# Patient Record
Sex: Female | Born: 1945 | Race: White | Hispanic: No | Smoking: Never smoker
Health system: Southern US, Community
[De-identification: ages and names within clinical notes are randomized; demographics above are authoritative.]

## PROBLEM LIST (undated history)

## (undated) DIAGNOSIS — G2581 Restless legs syndrome: Secondary | ICD-10-CM

## (undated) DIAGNOSIS — G4733 Obstructive sleep apnea (adult) (pediatric): Secondary | ICD-10-CM

## (undated) DIAGNOSIS — R51 Headache: Secondary | ICD-10-CM

## (undated) DIAGNOSIS — K219 Gastro-esophageal reflux disease without esophagitis: Secondary | ICD-10-CM

## (undated) DIAGNOSIS — I2510 Atherosclerotic heart disease of native coronary artery without angina pectoris: Secondary | ICD-10-CM

## (undated) DIAGNOSIS — F32A Depression, unspecified: Secondary | ICD-10-CM

## (undated) DIAGNOSIS — I480 Paroxysmal atrial fibrillation: Secondary | ICD-10-CM

## (undated) DIAGNOSIS — R519 Headache, unspecified: Secondary | ICD-10-CM

## (undated) DIAGNOSIS — I5189 Other ill-defined heart diseases: Secondary | ICD-10-CM

## (undated) DIAGNOSIS — F329 Major depressive disorder, single episode, unspecified: Secondary | ICD-10-CM

## (undated) DIAGNOSIS — E785 Hyperlipidemia, unspecified: Secondary | ICD-10-CM

## (undated) DIAGNOSIS — I10 Essential (primary) hypertension: Secondary | ICD-10-CM

## (undated) DIAGNOSIS — M1990 Unspecified osteoarthritis, unspecified site: Secondary | ICD-10-CM

## (undated) HISTORY — DX: Hyperlipidemia, unspecified: E78.5

## (undated) HISTORY — DX: Obstructive sleep apnea (adult) (pediatric): G47.33

## (undated) HISTORY — PX: CHOLECYSTECTOMY: SHX55

## (undated) HISTORY — DX: Other ill-defined heart diseases: I51.89

## (undated) HISTORY — PX: LUMBAR DISC SURGERY: SHX700

## (undated) HISTORY — DX: Paroxysmal atrial fibrillation: I48.0

## (undated) HISTORY — DX: Restless legs syndrome: G25.81

## (undated) HISTORY — PX: ROTATOR CUFF REPAIR: SHX139

## (undated) HISTORY — PX: CARDIAC CATHETERIZATION: SHX172

## (undated) HISTORY — PX: TENDON REPAIR: SHX5111

---

## 1983-03-29 HISTORY — PX: OTHER SURGICAL HISTORY: SHX169

## 1999-02-25 ENCOUNTER — Encounter: Admission: RE | Admit: 1999-02-25 | Discharge: 1999-02-25 | Payer: Self-pay | Admitting: Family Medicine

## 1999-02-25 ENCOUNTER — Encounter: Payer: Self-pay | Admitting: Family Medicine

## 1999-03-16 ENCOUNTER — Encounter: Payer: Self-pay | Admitting: General Surgery

## 1999-03-17 ENCOUNTER — Encounter (INDEPENDENT_AMBULATORY_CARE_PROVIDER_SITE_OTHER): Payer: Self-pay | Admitting: *Deleted

## 1999-03-17 ENCOUNTER — Observation Stay (HOSPITAL_COMMUNITY): Admission: RE | Admit: 1999-03-17 | Discharge: 1999-03-18 | Payer: Self-pay | Admitting: General Surgery

## 1999-06-03 ENCOUNTER — Encounter: Admission: RE | Admit: 1999-06-03 | Discharge: 1999-06-03 | Payer: Self-pay | Admitting: Family Medicine

## 1999-06-03 ENCOUNTER — Encounter: Payer: Self-pay | Admitting: Family Medicine

## 2000-06-08 ENCOUNTER — Encounter: Payer: Self-pay | Admitting: Family Medicine

## 2000-06-08 ENCOUNTER — Encounter: Admission: RE | Admit: 2000-06-08 | Discharge: 2000-06-08 | Payer: Self-pay | Admitting: Family Medicine

## 2001-06-15 ENCOUNTER — Encounter: Payer: Self-pay | Admitting: Family Medicine

## 2001-06-15 ENCOUNTER — Encounter: Admission: RE | Admit: 2001-06-15 | Discharge: 2001-06-15 | Payer: Self-pay | Admitting: Family Medicine

## 2001-11-28 ENCOUNTER — Ambulatory Visit (HOSPITAL_COMMUNITY): Admission: RE | Admit: 2001-11-28 | Discharge: 2001-11-29 | Payer: Self-pay | Admitting: Neurosurgery

## 2002-06-03 ENCOUNTER — Encounter: Payer: Self-pay | Admitting: Family Medicine

## 2002-06-03 ENCOUNTER — Ambulatory Visit (HOSPITAL_COMMUNITY): Admission: RE | Admit: 2002-06-03 | Discharge: 2002-06-03 | Payer: Self-pay | Admitting: Family Medicine

## 2002-06-17 ENCOUNTER — Encounter: Payer: Self-pay | Admitting: Family Medicine

## 2002-06-17 ENCOUNTER — Encounter: Admission: RE | Admit: 2002-06-17 | Discharge: 2002-06-17 | Payer: Self-pay | Admitting: Family Medicine

## 2003-04-23 ENCOUNTER — Encounter
Admission: RE | Admit: 2003-04-23 | Discharge: 2003-05-19 | Payer: Self-pay | Admitting: Physical Medicine and Rehabilitation

## 2003-06-06 ENCOUNTER — Ambulatory Visit (HOSPITAL_COMMUNITY)
Admission: RE | Admit: 2003-06-06 | Discharge: 2003-06-06 | Payer: Self-pay | Admitting: Physical Medicine and Rehabilitation

## 2003-07-14 ENCOUNTER — Encounter: Admission: RE | Admit: 2003-07-14 | Discharge: 2003-09-17 | Payer: Self-pay | Admitting: Orthopedic Surgery

## 2003-09-16 ENCOUNTER — Encounter: Admission: RE | Admit: 2003-09-16 | Discharge: 2003-09-17 | Payer: Self-pay | Admitting: Orthopedic Surgery

## 2004-03-28 HISTORY — PX: CORONARY STENT PLACEMENT: SHX1402

## 2004-04-16 ENCOUNTER — Encounter: Admission: RE | Admit: 2004-04-16 | Discharge: 2004-04-16 | Payer: Self-pay | Admitting: Family Medicine

## 2005-01-28 ENCOUNTER — Inpatient Hospital Stay (HOSPITAL_COMMUNITY): Admission: EM | Admit: 2005-01-28 | Discharge: 2005-02-01 | Payer: Self-pay | Admitting: Emergency Medicine

## 2005-02-22 ENCOUNTER — Ambulatory Visit (HOSPITAL_COMMUNITY): Admission: RE | Admit: 2005-02-22 | Discharge: 2005-02-23 | Payer: Self-pay | Admitting: Interventional Cardiology

## 2005-12-14 ENCOUNTER — Observation Stay (HOSPITAL_COMMUNITY): Admission: AD | Admit: 2005-12-14 | Discharge: 2005-12-15 | Payer: Self-pay | Admitting: Cardiology

## 2007-02-20 ENCOUNTER — Inpatient Hospital Stay (HOSPITAL_COMMUNITY): Admission: EM | Admit: 2007-02-20 | Discharge: 2007-02-21 | Payer: Self-pay | Admitting: Emergency Medicine

## 2007-04-11 ENCOUNTER — Encounter: Admission: RE | Admit: 2007-04-11 | Discharge: 2007-04-11 | Payer: Self-pay | Admitting: Family Medicine

## 2008-04-11 ENCOUNTER — Encounter: Admission: RE | Admit: 2008-04-11 | Discharge: 2008-04-11 | Payer: Self-pay | Admitting: Family Medicine

## 2009-04-14 ENCOUNTER — Encounter: Admission: RE | Admit: 2009-04-14 | Discharge: 2009-04-14 | Payer: Self-pay | Admitting: Family Medicine

## 2010-04-14 ENCOUNTER — Encounter
Admission: RE | Admit: 2010-04-14 | Discharge: 2010-04-14 | Payer: Self-pay | Source: Home / Self Care | Attending: Family Medicine | Admitting: Family Medicine

## 2010-08-10 NOTE — H&P (Signed)
[name], [name] NO.:  [id number]   MEDICAL RECORD NO.:  [id number]          PATIENT TYPE:  INP   LOCATION:  2035                         FACILITY:  [facility]   PHYSICIAN:  Armanda Magic, M.D.     DATE OF BIRTH:  Mar 08, 1946   DATE OF ADMISSION:  02/20/2007  DATE OF DISCHARGE:                              HISTORY & PHYSICAL   CHIEF COMPLAINT:  Chest pain.   HISTORY OF PRESENT ILLNESS:  [name] is a 65 year old female with a  known history of coronary artery disease.  She received drug-eluting  stent to both the right coronary artery and LAD in November of 2006.  Re-  look catheterization for chest pain September of 2007 showed patent  stents but also showed small distal circumflex and RV branch disease  that were too small, and not amenable, for percutaneous intervention.  She now is having substernal chest pain/pressure and says that, It is  hard to breathe.  The pain started around 10:30 a.m. and has been  waxing and waning since that time.  She was put on IV nitroglycerin in  the emergency room.  Her pain subsided.   ALLERGIES:  NO KNOWN DRUG ALLERGIES.   MEDICATIONS:  1. Toprol XL 25 mg a day.  2. Crestor 10 mg a day.  3. Plavix 75 mg a day.  4. Imdur 30 mg a day.  5. Mirapex 0.5 mg twice a day.  6. Zetia 10 mg a day.  7. Nexium 40 mg a day.  8. Baby aspirin daily.   SOCIAL HISTORY:  No tobacco or alcohol or illicit drug use.   FAMILY HISTORY:  [name] died at age 12 but had his first myocardial  infarction at age 57.  [name] died at age 39 but had her first MI in her  66s.   PAST MEDICAL HISTORY:  1. Coronary artery disease as described above.  2. History of hypertension, hyperlipidemia.  3. Status post cholecystectomy.  4. Back surgery.  5. Left rotator cuff surgery.   VITALS:  Temperature 97.6, blood pressure 106/70, pulse 78, respirations  16.  HEENT:  Grossly normal.  No carotid or symptomatic bruits.  No JVD or  thyromegaly.  Throat clear.   Conjunctivae are normal.  Nares without  drainage.  CHEST:  Clear to auscultation bilaterally.  No wheezing or rhonchi.  HEART:  Regular rate and rhythm.  No gross murmur.  ABDOMEN:  Benign.  Good bowel sounds, nontender, nondistended.  No  masses, no bruits.  LOWER EXTREMITIES:  No peripheral edema.  SKIN:  Warm and dry.  PSYCH:  Normal mood and affect.   EKG shows normal sinus rhythm, rate 54, diffuse point elevation which is  essentially unchanged from February of 2006.  Chest x-ray with no active  disease.   LABORATORY DATA:  BNP less than 30.  __________  markers negative times  one.  D-dimer less than 0.22.  Sodium 147, potassium 4.3, BUN 23,  creatinine 0.7.   ASSESSMENT/PLAN:  1. Unstable angina.  2. Coronary artery disease.  3. Hypertension.  4. Hyperlipidemia.  5. Long-term medication use.  The patient will be placed on Lovenox, IV nitroglycerin drip and an  aspirin.  We will continue the same low dose beta  blocker but cannot  increase the secondary due to his bradycardia.  We will perform serial  cardiac isoenzymes.  If these are negative, she will have a Cardiolite  in the morning and, if positive, we will then need to proceed with  cardiac catheterization.   The patient was seen and examined by Dr. Armanda Magic; Guy Franco, [facility],  dictating for Dr. Armanda Magic.     ______________________________  Su Monks, M.D.  Electronically Signed    LB/MEDQ  D:  02/20/2007  T:  02/21/2007  Job:  [id number]   cc:   Bryan Lemma. Manus Gunning, M.D.  Tasia Catchings, M.D.

## 2010-08-13 NOTE — Cardiovascular Report (Signed)
NAME:  [name], [name] NO.:  [id number]   MEDICAL RECORD NO.:  [id number]          PATIENT TYPE:  INP   LOCATION:  3731                         FACILITY:  [facility]   PHYSICIAN:  Armanda Magic, M.D.     DATE OF BIRTH:  09-05-1945   DATE OF PROCEDURE:  12/15/2005  DATE OF DISCHARGE:  12/15/2005                              CARDIAC CATHETERIZATION   PROCEDURE:  Left heart catheterization, coronary angiography, left  ventriculography.   OPERATOR:  Armanda Magic, M.D.   INDICATIONS:  Chest pain, coronary disease.   COMPLICATIONS:  None.  At the time of cath, the patient did have acute  disorientation and agitation after receiving 2 mg of Versed IV.   IV MEDICATIONS:  1. Versed 2 mg IV.  2. Romazicon 0.4 mg IV.   IV access via right femoral artery 6-French sheath   HISTORY OF PRESENT ILLNESS:  This is a very pleasant 65 year old white  female with a history of coronary disease who presented with recurrence of  chest pain, over 2 weeks duration, fairly frequently, occurring every day.  She now presents for cardiac catheterization.   The patient was brought to cardiac catheterization laboratory during the  fasting non-sedated state.  Informed consent was obtained.  The patient was  connected to continuous heart rate and pulse oximetry monitoring and  intermittent blood pressure monitoring.  The patient had some problems with  restless leg syndrome, and therefore, was given 2 mg of IV Versed which  caused a paradoxical reaction.  She became very agitated and very  disoriented and had received Romazicon 0.4 mg IV with resolution of her  symptoms of disorientation agitation.  The right groin was prepped and  draped in a sterile fashion and 1% Xylocaine was used for local anesthesia.  Using modified Seldinger technique a 6-French sheath was placed a right  femoral artery.  Under fluoroscopic guidance, a 6-French JL-4 catheter was  placed in left coronary artery.   Multiple cine films were taken at 30 degree  RAO and 40 degree LAO views.  This catheter was then exchanged out of her  guidewire for 6-French JR-4 catheter, which was placed under fluoroscopic  guidance of the right coronary artery.  Multiple cine films taken in 30  degree RAO and 40 degree LAO views.  Catheter was exchanged out of her  guidewire for 6-French angled pigtail catheter, which was placed under  fluoroscopic guidance of the left ventricular cavity.  Left ventriculography  was performed in the 30 degree RAO view, in a total of 30 mL of contrast at  15 mL per second.  The catheter was then pulled back across the aortic valve  with no significant gradient noted.  At the end procedure, all catheters and  sheaths were removed.  Manual compression was performed until adequate  hemostasis was obtained.  The patient transferred back to room in stable  condition.   RESULTS:  1. Left main coronary artery is widely patent and bifurcates to left      anterior descending artery and left circumflex artery.  2. Left circumflex artery has a stent present  the proximal portion which      is widely patent.  It gives rise to a first diagonal branch which is      widely patent.  Just after the takeoff of the first diagonal branch is      30% eccentric plaque in the mid LAD.  The ongoing LAD traverses the      apex and is widely patent.  3. The left circumflex is widely patent in its proximal portion.  It gives      rise to a first large obtuse marginal one branch which has a 20%      narrowing in the proximal portion.  This vessel then bifurcates into      two daughter branches, both of which are widely patent.  The ongoing      left circumflex traverses the AV groove and becomes very small with a      70% narrowing distally, too small to be a amendable to PCI.  4. The right coronary artery shows a visible stent in the proximal portion      which is widely patent.  Shortly after, it gives off a  very large acute      RV marginal branch which has an 90% narrowing in the mid portion and      then bifurcates into two daughter branches.  The vessel is very small      and too small for a PCI.  The ongoing right coronary artery is widely      patent and bifurcates distally in a posterior descending artery and      posterolateral artery, both of which are widely patent.  5. Left ventriculography shows normal LV function.  EF 50%, LV pressure      163/3 mm,  aortic pressure 160/74 mmHg.   ASSESSMENT:  1. Widely patent LAD and RCA stents, branch vessel occlusive coronary      disease of acute RV marginal branch and distal left circumflex, too      small for PCI.  2. Normal LV function.  3. Atypical chest pain, most likely not cardiac, but could be secondary to      see me in small vessels with his that have a stress obstructive      disease.  4. Hypertension.  5. Dyslipidemia.   PLAN:  Discharge home after IV fluid and bedrest.  Aspirin and Plavix.  Start Imdur 30 mg a day.  Start Nexium 40 mg a day.  Follow up with me in 2  weeks at this dictation      Armanda Magic, M.D.  Electronically Signed     TT/MEDQ  D:  12/16/2005  T:  12/19/2005  Job:  [id number]

## 2010-08-13 NOTE — Cardiovascular Report (Signed)
NAME:  [name], [name] NO.:  [id number]   MEDICAL RECORD NO.:  [id number]          PATIENT TYPE:  OIB   LOCATION:  6532                         FACILITY:  [facility]   PHYSICIAN:  Lyn Records, M.D.   DATE OF BIRTH:  [birth year]   DATE OF PROCEDURE:  02/22/2005  DATE OF DISCHARGE:                              CARDIAC CATHETERIZATION   REFERRING PHYSICIAN:  Dr. Blair Heys and Dr. Armanda Magic   INDICATIONS FOR PROCEDURE:  Class II-III [place] Heart Association angina.  Documented plaque disruption with dissection in the proximal LAD.  This was  documented by catheterization on January 31, 2005.  The patient had a stent  placed in high-grade right coronary lesion on January 31, 2005 using a drug-  eluting stent.  She is admitted at this time for LAD angioplasty/stent.   PROCEDURE:  Direct stent proximal left anterior descending using CYPHER drug-  eluting stent.   DESCRIPTION:  After informed consent a 6-French sheath was placed in the  right femoral artery using the modified Seldinger technique.  A 6-French  XB3.5 left coronary system guide catheter was used for guiding shots.  A  bolus followed by an infusion of Angiomax was given.  ACT was documented to  be greater than 250.  We then proceeded using an [employer] medium wire and  crossed the stenosis in the proximal LAD without difficulty.  We then direct  stented with a 13 x 3 CYPHER drug-eluting stent and post dilated with a 12  mm x 3.5 Quantum balloon to 14 atmospheres.  A nice angiographic result was  noted.  No significant ischemic complications occurred during the procedure.  The bifurcation was spared with no stent placed over the ostium of the  diagonal and the diagonal was widely patent post procedure.  TIMI grade 3  flow was noted post procedure.   Sheathogram was performed and adequate position was noted for AngioSeal  closure.  This was performed without trouble and good hemostasis achieved.   CONCLUSION:  Successful direct stent of the proximal LAD with reduction in  stenosis from 80% to 0% with TIMI grade 3 flow.   PLAN:  Aspirin and Plavix for one year.  The patient has drug-eluting stents  in both the right and LAD coronaries.   PLAN:  Follow up with Dr. Mayford Knife and Dr. Manus Gunning.  Hopeful discharge  February 23, 2005.      Lyn Records, M.D.  Electronically Signed     HWS/MEDQ  D:  02/22/2005  T:  02/22/2005  Job:  [id number]

## 2010-08-13 NOTE — Discharge Summary (Signed)
[name], [name] NO.:  [id number]   MEDICAL RECORD NO.:  [id number]          PATIENT TYPE:  OIB   LOCATION:  6532                         FACILITY:  [facility]   PHYSICIAN:  Lyn Records, M.D.   DATE OF BIRTH:  1945-04-12   DATE OF ADMISSION:  02/22/2005  DATE OF DISCHARGE:  02/23/2005                                 DISCHARGE SUMMARY   PRIMARY CARDIOLOGIST:  Armanda Magic, M.D.   PRIMARY CARE PHYSICIAN:  Bryan Lemma. Manus Gunning, M.D.   DISCHARGE PHYSICIAN:  Verdis Prime, M.D.   CHIEF COMPLAINT AND REASON FOR ADMISSION:  [name] is a 65 year old  female patient initially referred to Cardiology for complaints of atypical  chest discomfort, subsequent abnormal Cardiolite study resulting in  diagnostic coronary angiography on January 31, 2005 resulting in PCI stent  implantation to the RCA.  The patient has remained stable since that time,  some mild angina, but not as severe as prior to the initial stent  implantation.  She had residual disease in the LAD and plans were to bring  her back at a later date to work on the LAD.  Therefore, she has been  admitted on February 22, 2005 to undergo PCI and possible stent implantation  to the LAD.   ADMITTING DIAGNOSES:  1.  Two-vessel coronary artery disease status post percutaneous coronary      intervention and stent to the right coronary artery on January 31, 2005      with a residual disease in the left anterior descending.  2.  Mild continued angina, but improved compared with baseline prior to      stent implantation.  3.  Hypertension.  4.  Dyslipidemia.  5.  Joint pain.  6.  Seasonal affective disorder.   HOSPITAL COURSE:  Two-vessel CAD with residual disease the LAD.  The patient  was taken to the catheter lab on February 22, 2005 by Dr. Katrinka Blazing, where she  underwent PCI and stent implantation to the LAD.  Initial lesion was 80% pre-  PCI, 0% post-PCI.  She was continued on her aspirin and Plavix with  recommendations to continue the Plavix for at least one year post  intervention.  She received Angiomax in the cath lab, but this was not  continued post procedure.   On the morning post procedure patient had had no further episodes of chest  pain or any shortness of breath.  She had some mild intermittent discomfort,  which is now resolved, in the right groin without any problems in the right  groin after ambulation otherwise.  Her creatinine was 0.8 post cath.  Her  potassium was 3.9.  Hemoglobin stable at 12.2.  Right groin was unremarkable  without bruising or hematoma.  She was in sinus bradycardia.   FINAL DISCHARGE DIAGNOSES:  1.  Two-vessel coronary artery disease status post percutaneous coronary      intervention and stent implantation to the right coronary artery on      January 31, 2005 with percutaneous coronary intervention and Cypher      stent implantation to the left anterior descending this admission.  2.  Class 3 angina stable at this point.  3.  Hypertension, controlled.  4.  Dyslipidemia.  5.  Sinus bradycardia secondary to beta-blocker therapy.   DISCHARGE MEDICATIONS:  1.  Celebrex 200 mg daily.  2.  Toprol XL 25 mg daily.  3.  Zocor 40 mg daily.  4.  Fish oil as before.  5.  Calcium 1200 mg plus D daily.  6.  Vitamin pill daily.  7.  Plavix 75 mg daily.  8.  Aspirin 325 mg daily.  9.  Mirapex 0.5 mg twice a day.   Return to work on Monday, February 28, 2005.   DIET:  Heart healthy.   ACTIVITY:  Increase activity slowly, may shower or bathe, no driving for 48  hours, no lifting greater than 10 pounds for 48 hours.  Call for any  bruising or swelling in right groin.  Follow-up with Dr. Mayford Knife on March 09, 2005 at 9:30 in the morning.      [name], N.P.      Lyn Records, M.D.  Electronically Signed    ALE/MEDQ  D:  02/23/2005  T:  02/23/2005  Job:  [id number]   cc:   Armanda Magic, M.D.  Fax: [phone]   Bryan Lemma. Manus Gunning, M.D.   Fax: [phone]

## 2010-08-13 NOTE — Cardiovascular Report (Signed)
NAMEJASMANE, [name] NO.:  [id number]   MEDICAL RECORD NO.:  [id number]          PATIENT TYPE:  INP   LOCATION:  2016                         FACILITY:  [facility]   PHYSICIAN:  Armanda Magic, M.D.     DATE OF BIRTH:  [birth year]   DATE OF PROCEDURE:  01/31/2005  DATE OF DISCHARGE:                              CARDIAC CATHETERIZATION   No dictation for this report.      Armanda Magic, M.D.  Electronically Signed     TT/MEDQ  D:  01/31/2005  T:  01/31/2005  Job:  [id number]

## 2010-08-13 NOTE — Discharge Summary (Signed)
[name], RECORD NO.:  [id number]   MEDICAL RECORD NO.:  [id number]          Young TYPE:  INP   LOCATION:  3731                         FACILITY:  [facility]   PHYSICIAN:  Armanda Magic, M.D.     DATE OF BIRTH:  1945/08/09   DATE OF ADMISSION:  12/14/2005  DATE OF DISCHARGE:  12/15/2005                                 DISCHARGE SUMMARY   ADMISSION DIAGNOSIS:  Chest pain.   DISCHARGE DIAGNOSES:  1. Chest pain, status post cardiac catheterization with patent left      anterior descending and right coronary artery arteries and      nonobstructive coronary arteries on December 15, 2005.  2. Coronary artery disease, status post stent implantation to Kristi right      coronary artery and left anterior descending in November 2006.  3. Hyperlipidemia.  4. Hypertension.  5. Seasonal affective disorder.  6. Status post wrist surgery.  7. Status post cholecystectomy.  8. Status post disk surgery.  9. Status post shoulder surgery.   PROCEDURE:  Left heart catheterization, coronary angiography, left  ventriculography on December 15, 2005.   HOSPITAL COURSE:  [name] is a 65 year old female with a known history  of coronary artery disease.  She underwent intervention to her right  coronary artery and LAD in November 2006, in staged procedures.  She was  admitted to [facility] on yesterday for substernal chest pain.  Serial cardiac enzymes were obtained x3 and were negative with a peak  troponin of 0.01.  EKG was obtained in [facility] office prior to  admitting [name] to [facility] hospital and revealed marked sinus bradycardia  with a ventricular rate of 47 beats per minute with nonspecific ST and T-  wave abnormalities with early repolarization and no ischemic changes.  A PA  and lateral chest x-ray revealed no acute cardiopulmonary process.  Kristi  Young was scheduled for and inpatient diagnostic cardiac catheterization  on December 15, 2005, which revealed patent RCA and LAD stents and  nonobstructive coronary arteries.  There was a branch vessel occlusive of  Kristi LAD of an acute circumflex marginal/distal left circumflex that was too  small for PCI.  There was normal LV function.  Kristi chest pain was atypical  and most likely noncardiac, but could be secondary to ischemia in Kristi small  vessel with obstructive disease.  Kristi plan was to discharge [name] to  home today after IV fluids and bed rest were completed.  [name]  ambulated without complaint of angina, shortness of breath, nausea,  vomiting, diaphoresis, or dizziness and her groin was stable without  bleeding, oozing, or hematoma.  A fasting lipid panel was obtained during  this admission and revealed a total cholesterol of 216 with a LDL of 129,  HDL of 66 and triglycerides of 104.  A TSH was normal at 0.777.  Upon  discharge, [name] was continued on Plavix 75 mg daily, as well as  aspirin 325 mg daily.  Added to her medical regimen was Imdur 30 mg daily as  well as Nexium  40 mg daily.   LABORATORY DATA:  Sodium 139, potassium 4.3, chloride 107, CO2 29, glucose  95, BUN 19, creatinine 0.9.  Total bilirubin 0.7, Alk phos 79, AST 28,  ALT  21, total protein 6.7, serum albumin 3.9, calcium 9.7.  Serial cardiac  markers with CK total 110, 106, and 86 respectively; CK-MB 3.6, 2.7 and 1.8  respectively; troponin I 0.01, less than 0.01 and less than 0.01  respectively.  Fasting lipid panel with total cholesterol 216, triglycerides  104, HDL 66, LDL 129.  TSH 0.777.  PT 14.5, INR 1.1 and PTT 32.  White blood  count 7.4, hemoglobin 12.9, hematocrit 38.1, platelets 212.  TSH is 0.777.   PA and lateral chest x-ray on December 15, 2005, no acute cardiopulmonary  process.  EKG as stated in Kristi hospital course.   CONDITION ON DISCHARGE:  [name] was discharged to home in stable  condition.  She ambulated after completing her IV fluids and bed rest  without  complaints of angina, shortness of breath, nausea, vomiting,  diaphoresis or dizziness.  Her groin was stable without evidence of  bleeding, oozing or hematoma.   DISCHARGE MEDICATIONS:  1. Imdur 30 mg daily.  This represents a new prescription, #30 with 6      refills.  2. Nexium 40 mg daily.  This represents a new prescription, #30 with 6      refills.  3. Plavix 75 mg daily.  4. Enteric-coated aspirin 325 mg daily.  5. Toprol XL 25 mg daily.  6. Mirapex 0.5 mg twice daily.  7. Zetia 10 mg daily.  8. Folic acid 1 mg daily.  9. Fish oil, dosage as prior to admission.  10.Calcium plus vitamin D one tablet daily.   DIET:  [name] was encouraged to continue a heart-healthy diet including  low-salt, low-fat and low cholesterol.   ACTIVITY:  [name] was instructed to avoid lifting greater than 10  pounds x1 week.  She was also instructed to avoid driving x1 day.   WOUND CARE:  [name] was instructed to bathe her groin area with warm  soap and water and to avoid scrubbing Kristi area.   SPECIAL INSTRUCTIONS:  [name] was instructed to call her MD's office  with recurrent chest pain.  [name] was instructed to stop Celebrex 200  mg.   FOLLOW UP:  [name] was instructed to follow up with Dr. Armanda Magic at  [facility] on January 02, 2006, at 2:45 p.m.  [name] was  instructed to call [phone], if she was unable to keep Kristi scheduled  appointment.      Tylene Fantasia, [place]      Armanda Magic, M.D.  Electronically Signed    RDM/MEDQ  D:  12/15/2005  T:  12/17/2005  Job:  [id number]   cc:   Bryan Lemma. Manus Gunning, M.D.  Tasia Catchings, M.D.

## 2010-08-13 NOTE — Op Note (Signed)
NAME:  [name], [name] NO.:  [id number]   MEDICAL RECORD NO.:  [id number]                   PATIENT TYPE:  OIB   LOCATION:  3029                                 FACILITY:  [facility]   PHYSICIAN:  Cristi Loron, M.D.            DATE OF BIRTH:  Oct 20, 1945   DATE OF PROCEDURE:  11/28/2001  DATE OF DISCHARGE:                                 OPERATIVE REPORT   BRIEF HISTORY:  The patient is a 65 year old white female who suffered from  back and intractable right leg pain consistent with a right S1  radiculopathy.  She failed medical management and was worked up with an MRI  scan which demonstrated a large herniated disk at L5-S1 on the right.  The  patient's signs and symptoms and physical examination were consistent with a  right S1 radiculopathy.  I discussed the risks and treatment options with  her including doing nothing, routine medical management and surgery.  The  patient weighed the risks, benefits and alternatives of surgery and decided  to proceed with the microdiskectomy.   PREOPERATIVE DIAGNOSES:  1. Right L5-S1 herniated nucleus pulposus.  2. Stenosis.  3. Lumbago.  4. Lumbar radiculopathy.   POSTOPERATIVE DIAGNOSES:  1. Right L5-S1 herniated nucleus pulposus.  2. Stenosis.  3. Lumbago.  4. Lumbar radiculopathy.   PROCEDURE:  Right L5-S1 microdiskectomy using microdissection.   SURGEON:  Cristi Loron, M.D.   ASSISTANT:  Payton Doughty, M.D.   ANESTHESIA:  General endotracheal.   ESTIMATED BLOOD LOSS:  Less than 100 cc.   SPECIMENS:  None.   DRAINS:  None.   COMPLICATIONS:  None.   DESCRIPTION OF PROCEDURE:  The patient was brought to the operating room by  the anesthesia team.  General endotracheal anesthesia was induced.  The  patient was then turned to the prone position on the Wilson frame.  The  lumbosacral region was then prepared with Betadine scrub and Betadine  solution.  Sterile drapes were applied.  I then  injected the ectomy sites  with Marcaine with epinephrine solution.   I used a scalpel to make a midline incision over the L5-S1 interspace.  I  used the electrocautery to dissect down to the thoracolumbar fascia in line  with the fascia just to the right of the midline performing a right-sided  subperiosteal dissection stripping the paraspinous musculature from the  right spinous process and lamina of L5 from the upper sacrum.  I inserted  the McCall retractor for exposure and then obtained an intraoperative  radiograph to confirm our location.   We then brought the operating microscope into the field and under  magnification and illumination we completed the  microdissection/decompression.  The high-speed drill was used to perform a  right L5 laminotomy.  The laminotomy was widened with the Kerrison punch  removing the right L5-S1 ligamentum flavum and performing a foraminotomy  about the right S1 nerve  root.  We used bipolar dissection to free up the  thecal sac from the epidural tissue and Dr. Channing Mutters carefully retracted the  thecal sac and S1 nerve root medially.  This exposed a large herniated disk  right at the disk space.  I incised into the herniated disk and performed a  partial diskectomy using the pituitary forceps and the exiting curets and a  partial intervertebral diskectomy.  After I was satisfied with the  diskectomy, I palpated along the ventral surface of the thecal sac, along  the external route of the right S1 nerve root.  I noted that the neural  structures were well decompressed.  We obtained stringent hemostasis using  bipolar electrocautery and Gelfoam.  The wound was then copiously irrigated  out with Bacitracin solution.  The McCall retractor was then removed and  then the thoracolumbar fascia was reapproximated using interrupted #1 Vicryl  suture, the subcutaneous tissue with 2-0 Vicryl suture and the skin with  Steri-Strips and Benzoin.  The wound was then coated  with Bacitracin  ointment.  A sterile dressing was applied.  The Tegaderm was removed.  The  patient was returned to the supine position where she was extubated by  anesthesia team and transported to the postanesthesia care unit in stable  condition.  All sponge, instrument and needle counts were correct at the end  of this case.                                               Cristi Loron, M.D.    JDJ/MEDQ  D:  11/28/2001  T:  11/29/2001  Job:  [id number]

## 2010-08-13 NOTE — Discharge Summary (Signed)
NAMELOTA, LEAMER NO.:  [id number]   MEDICAL RECORD NO.:  [id number]          PATIENT TYPE:  INP   LOCATION:  2035                         FACILITY:  [facility]   PHYSICIAN:  Armanda Magic, M.D.     DATE OF BIRTH:  [birth year]   DATE OF ADMISSION:  02/20/2007  DATE OF DISCHARGE:  02/21/2007                               DISCHARGE SUMMARY   DISCHARGE DIAGNOSES:  1. Chest pain, resolved.  2. Known coronary artery disease, history drug-eluting stent to the      right coronary artery and left anterior descending artery in      November 2007.  3. Hypertension.  4. Hyperlipidemia.  5. Long-term medication use.  6. Family history of coronary artery disease.   Ms. Sholtz is a 65 year old female with known coronary artery disease  as described above.  She was admitted on February 20, 2007, with  substernal chest pain.  Her cardiac isoenzymes remained negative  overnight.  We performed a stress Cardiolite.  This showed no stress-  induced perfusion defects.  Her EF was 69% and for this reason we felt  it was safe to send her home.   She was discharged to home in stable and improved condition on the  following medications:  1. Toprol-XL 25 mg a day.  2. Crestor daily.  3. Zetia daily.  4. Plavix 75 mg a day.  5. Mirapex 0.5 mg twice a day.  6. Nexium 40 mg a day.  7. Baby aspirin 81 mg a day.  8. Sublingual nitroglycerin p.r.n. chest pain.  9. Zoloft as daily as prior to admission.  10.Imdur 60 mg one tablet daily.   Remain on a low-sodium heart-healthy diet.  Increase activity slowly.  Return to see Dr. Mayford Knife on March 08, 2007, at 1:30 p.m.     Guy Franco, P.A.      Armanda Magic, M.D.  Electronically Signed   LB/MEDQ  D:  03/08/2007  T:  03/09/2007  Job:  [id number]

## 2010-08-13 NOTE — H&P (Signed)
NAME:  [name], [name] NO.:  [id number]   MEDICAL RECORD NO.:  [id number]          PATIENT TYPE:  INP   LOCATION:  3731                         FACILITY:  [facility]   PHYSICIAN:  Armanda Magic, M.D.     DATE OF BIRTH:  09-Jan-1946   DATE OF ADMISSION:  12/14/2005  DATE OF DISCHARGE:                                HISTORY & PHYSICAL   CHIEF COMPLAINT:  Chest pain.   [name] is a 65 year old female with a known history of coronary artery  disease.  She underwent intervention to her right coronary artery and LAD in  November, 2006 in staged procedures.  Over the past three weeks, she has  noted recurrent substernal chest pain that has been intermittent in nature.  The pain is radiating to both arms, and she also complains of pain in her  right foot/ankle.  The pain is not similar to previous chest pain.  She  denies any shortness of breath, palpitations, or syncope.  She has had  intermittent dizziness.  Review of systems otherwise negative.   ALLERGIES:  No known drug allergies.   MEDICATIONS:  1. Vitamin E daily.  2. Enteric-coated aspirin 325 mg a day.  3. Calcium daily.  4. Toprol XL 25 mg a day.  5. Mirapex daily.  6. Folic acid daily.  7. Multivitamin daily.  8. Omega III daily.  9. Sublingual nitroglycerin p.r.n.  10.Plavix 75 mg a day.  11.Zetia 10 mg a day.   SOCIAL HISTORY:  No tobacco, alcohol, or illicit drug use.   FAMILY HISTORY:  Mom died at age 6 but had a history of myocardial  infarction at age 45.  Her dad is deceased.  He had a history of myocardial  infarction at age 76 and 46.   PAST MEDICAL HISTORY:  Hyperlipidemia, hypertension, seasonal affective  disorder.  Status post wrist surgery, cholecystectomy, disk surgery, and  shoulder surgery.   SOCIAL HISTORY:  No tobacco, alcohol, or illicit drug use.   She denies dye allergy.  No known drug allergies.   PHYSICAL EXAMINATION:  VITAL SIGNS:  Weight 184.  Pulse 48, blood pressure  164/90.  HEENT:  Grossly normal.  Sclerae are clear.  Conjunctivae normal.  Nares  without drainage.  NECK:  No carotid or subclavian bruits.  No JVD or thyromegaly.  CHEST:  Clear to auscultation bilaterally.  No wheezing or rhonchi.  HEART:  Regular rate and rhythm.  No gross murmur.  ABDOMEN:  Good bowel sounds.  Nontender, nondistended.  No masses.  No  bruits.  No femoral bruits.  EXTREMITIES:  Palpable PT pulses bilaterally, right greater than left.  No  peripheral edema.  NEUROLOGIC:  Cranial nerves II-XII grossly intact.  He is alert and oriented  x3.  SKIN:  Warm and dry.   ASSESSMENT/PLAN:  1. Chest pain.  2. Known coronary artery disease, as described above.  3. Hyperlipidemia.  4. Seasonal affective disorder.  5. Long term medication use.   She will be admitted to [facility].  We will draw lab work and  cycle cardiac isoenzymes.  We will  place her on IV nitroglycerin, Lovenox,  and plan for cardiac catheterization tomorrow morning.  The procedure has  been explained to the patient, and in fact, she has undergone this in the  past.  Risks include heart attack, stroke, or death, right groin  hematoma/ecchymosis.  The patient agrees to proceed with the procedure.   Dr. Armanda Magic was present in the office and also participated in her  care.      [name], P.A.      Armanda Magic, M.D.  Electronically Signed    LB/MEDQ  D:  12/14/2005  T:  12/14/2005  Job:  [id number]   cc:   [name], M.D.  [name], M.D.

## 2010-08-13 NOTE — Cardiovascular Report (Signed)
NAMELORENA, BENHAM NO.:  [id number]   MEDICAL RECORD NO.:  [id number]          PATIENT TYPE:  INP   LOCATION:  2016                         FACILITY:  [facility]   PHYSICIAN:  Armanda Magic, M.D.     DATE OF BIRTH:  1945/09/10   DATE OF PROCEDURE:  01/31/2005  DATE OF DISCHARGE:                              CARDIAC CATHETERIZATION   REFERRING PHYSICIAN:  Bryan Lemma. Manus Gunning, M.D.   PROCEDURE:  Left heart catheterization, coronary angiography, left  ventriculography.   OPERATOR:  Armanda Magic, M.D.   INDICATIONS:  Chest pain and unstable angina.   COMPLICATIONS:  None.   IV ACCESS:  Via right femoral artery 6-French sheath.   IV CONTRAST:  80 mL.   This is a very pleasant 65 year old white female who has been having  episodic chest pain with exertion and with rest and is relieved with  nitroglycerin. She presented to my office on Friday, January 28, 2005, and  was subsequently admitted to the hospital for unstable angina pectoris. She  did rule out for myocardial infarction by cardiac enzymes and now presents  for cardiac catheterization.   The patient was brought to the cardiac catheterization laboratory in a  fasting nonsedated state. Informed consent was obtained. The patient was  connected to continuous heart rate and pulse oximetry monitoring and  intermittent blood pressure monitoring. The right groin was prepped and  draped in sterile fashion. Xylocaine 1% was used for local anesthesia. Using  modified Seldinger technique a 6-French sheath was placed in the right  femoral artery. Under fluoroscopic guidance a 6-French JL-4 catheter was  placed in the left coronary artery. Multiple cine films were taken at 30  degree RAO and 40 degree LAO views. Catheter was then exchanged lout over a  guidewire for 6-French JR-4 catheter which was placed under fluoroscopic  guidance in the right coronary artery. Multiple cine films were taken in 30  degree RAO and 40  degree LAO views. This catheter was then exchanged out  over a guidewire for 6-French angled pigtail catheter which was placed under  fluoroscopic guidance in the left ventricular cavity. Left ventriculography  was performed in a 30 degree RAO view using total of 30 mL of contrast at 15  mL per second. The catheter was then pulled back across the aortic valve  with no significant gradient noted. At the end of the procedure, the patient  went on to angioplasty.   RESULTS:  1.  Left main coronary artery is widely patent and bifurcates into left      anterior descending artery and left circumflex artery. Left anterior      descending artery has an 80% concentric lesion in the proximal portion      just before the takeoff of a first diagonal branch. The ongoing LAD is      widely patent and traverses the apex. It does give rise to a first      diagonal which is a moderate sized vessel and is widely patent.   1.  The left circumflex is widely patent in its proximal portion and then  gives rise to a large obtuse marginal 1 branch. The obtuse marginal has      a 20% tubular narrowing and is slightly hazy in the proximal portion. It      then bifurcates into two daughter vessels, both of which are widely      patent.   The ongoing left circumflex traverses the AV groove and is a small vessel  with a 50% narrowing distally.   1.  The right coronary artery has an 90% tubular lesion in the proximal      portion of the RCA.  The RCA then is widely patent throughout its course      distally and bifurcates into posterior descending artery and posterior      lateral artery, both of which are widely patent.   LV function appears normal. Aortic pressure 139/62 mmHg. LV pressure 149/8  mmHg. LVEDP 20 mmHg. EF 60%.   ASSESSMENT:  1.  Two-vessel obstructive coronary disease.  2.  Normal left ventricular function.  3.  Unstable angina pectoris.  4.  Hypertension.   PLAN:  PCI to RCA, aspirin,  Plavix, continue Toprol, check a fasting lipid  panel, will ultimately need PCI of the LAD in the near future as well.      Armanda Magic, M.D.  Electronically Signed     TT/MEDQ  D:  01/31/2005  T:  01/31/2005  Job:  [id number]   cc:   Bryan Lemma. Manus Gunning, M.D.  Fax: [phone]

## 2010-08-13 NOTE — Discharge Summary (Signed)
NAMEJENNESSY, SANDRIDGE NO.:  [id number]   MEDICAL RECORD NO.:  [id number]          PATIENT TYPE:  INP   LOCATION:  6531                         FACILITY:  [facility]   PHYSICIAN:  Armanda Magic, M.D.     DATE OF BIRTH:  01/12/1946   DATE OF ADMISSION:  01/28/2005  DATE OF DISCHARGE:  02/01/2005                                 DISCHARGE SUMMARY   PRIMARY CARE PHYSICIAN:  Molly Maduro R. Manus Gunning, M.D.   DISCHARGING PHYSICIAN:  Everette Rank, M.D.   Ms. Willner is a 65 year old female patient of Dr. Randel Books who presented  to the office on January 28, 2005 with symptoms consistent with unstable  angina.  Her prior history of hypertension and dyslipidemia, and she has a  positive family history for CAD.  She also has what was described as  abnormal EKGs.  According to the H&P, the EKG performed in the office on the  day of admission revealed ST-segment elevation of 2 mm in the anterior  leads.  This is similar to an EKG from January 26, 2005 from Dr. Randel Books  office.  When compared with previous EKG from 2004, there was ST elevation  of 1 mm on that EKG as well.   In discussing the patient's symptoms, she has been having both rest and  exertional chest pain for at least six weeks, progressive in nature,  occurring as much as five times per week.  Dr. Manus Gunning started the patient  on sublingual nitroglycerin, and nitroglycerin does relieve the discomfort.  Pain not relieved with rest.  Because of these symptoms of unstable angina,  Dr. Mayford Knife felt it best to admit the patient for further monitoring, cardiac  isoenzyme cycling and planned cardiac catheterization on January 31, 2005.   ADMITTING DIAGNOSES:  1.  Chest pain concerning for unstable angina.  2.  Hypertension, currently controlled.  3.  Dyslipidemia.  4.  Osteoarthritis with associated joint pain.   HOSPITAL COURSE:  Unstable angina.  The patient was admitted to the  telemetry unit as stated where she was  continued on Toprol, aspirin was  increased to 325 daily, and she was started on Lovenox 1 mg/kg  subcutaneously every 12 hours for her unstable angina.  She remained chest  pain free throughout the weekend, remained in sinus rhythm.  EKGs were  unchanged with early repolarization changes noted.  Cardiac isoenzymes were  negative.   She was taken to the catheterization lab by Dr. Mayford Knife on January 31, 2005  where she was found to have an 80% lesion in the LAD as well as a 90% lesion  in the RCA.  Dr. Verdis Prime was consulted for interventional cardiology.  He performed PCI and subsequent Cypher stent implantation to the RCA.  Preprocedure 95% stenosis, 0% post procedure.  She was started on aspirin  and Plavix and was given Angiomax in the catheterization lab.  Her groin was  Angio-Sealed without difficulty.   On the morning of discharge, the patient's right groin was unremarkable  without bruising or hematoma.  Distal pulse was intact.  In review of  telemetry alarms, the  patient has had several episodes of sinus bradycardia  with ventricular rates anywhere from 28 to 36.  The pauses range from 2.52  seconds all the way up to 3.95 seconds.  The first episode occurred on the  afternoon of January 31, 2005 while coughing.  The next two episodes  occurred on the morning of discharge around seven-thirty in the morning  while the patient was sleeping.  The patient was asymptomatic, and these  episodes did not awaken her from sleep.  Dr. Eldridge Dace has reviewed the  patient's history and chart medications, and we have decided to decrease the  Toprol down to 25 mg daily at this point.   FINAL DISCHARGE DIAGNOSES:  1.  Two-vessel coronary artery disease.  2.  Status post percutaneous coronary intervention and Cypher stent      implantation to the right coronary artery.  3.  Residual disease of the left anterior descending, percutaneous coronary      intervention pending in the next two to  three weeks.  4.  Unstable angina, currently without episodic chest pain at discharge.  5.  Hypertension, controlled.  6.  Dyslipidemia.  Lipid panel this hospitalization:  HDL 69, LDL 89, both      at goal.  7.  Osteoarthritis.  8.  Recent cough.  Started on Mucinex this hospitalization.  9.  Transient bradyarrhythmia secondary to vagal activity with coughing and      while sleeping.   DISCHARGE MEDICATIONS:  1.  Zocor 40 mg daily.  2.  Mucinex 600 mg b.i.d. for cough.  This is available over the counter.  3.  Aspirin 325 mg daily.  New dose.  4.  Plavix 75 mg daily.  This is a new medication.  5.  Celebrex 200 mg daily.  6.  Toprol XL 25 mg one tablet daily.  This is a new dose.  7.  Mirapex 0.5 mg b.i.d.  8.  Omega 3 fatty acids 1000 mg b.i.d.  9.  Nitroglycerin 0.4 mg as needed for chest pain.   NOTATION:  The patient utilizes a drug company for faxed or mailed in  prescriptions, and she normally gets a three-month supply for each  prescription.  She has been given prescriptions to fill now.  She states Dr.  Randel Books office normally faxes her medications.  We will have her call our  office and speak to [facility], Dr. Norris Cross nurse, so we can obtain the  appropriate information and fax prescriptions for her cardiac medications  for her within the next one to two weeks.   ADDITIONAL DISCHARGE INSTRUCTIONS:  1.  Return to work February 03, 2005.  2.  Diet:  Heart healthy.  3.  Activity:  Increase activity slowly, may shower or bathe, no driving for      the next 48 hours, no lifting greater      than ten pounds for the next two days.  4.  Wound care:  Call if any swelling or increased bruising at the right      groin site.  5.  Followup appointments:  She should see Dr. Mayford Knife on February 15, 2005      at 9:45 a.m.      Allison L. Ulla Gallo, M.D.  Electronically Signed   ALE/MEDQ  D:  02/01/2005  T:  02/01/2005  Job:  [id number]   cc:   Lyn Records, M.D.  Fax: [phone]   Bryan Lemma. Manus Gunning, M.D.  Fax: [phone]

## 2010-08-13 NOTE — Cardiovascular Report (Signed)
[name], [name] NO.:  [id number]   MEDICAL RECORD NO.:  [id number]          PATIENT TYPE:  INP   LOCATION:  2016                         FACILITY:  [facility]   PHYSICIAN:  Lyn Records, M.D.   DATE OF BIRTH:  [birth year]   DATE OF PROCEDURE:  01/31/2005  DATE OF DISCHARGE:                              CARDIAC CATHETERIZATION   INDICATION:  Progressive angina over the last 4 weeks. Recent  hospitalization with unstable angina. Documentation of high grade RCA by  angiography January 31, 2005. Moderately severe proximal LAD disease.   PROCEDURE PERFORMED:  1.  Cypher drug-eluting stent implantation in the mid right coronary.  2.  Re-angiography of the left coronary artery.   DESCRIPTION:  After informed consent, a percutaneous intervention was  performed on the right coronary. The right coronary was earlier demonstrated  to contain high grade mid disease by Dr. Mayford Knife. Angiomas and 600 mg of  Plavix were used as antithrombotic and antiplatelet therapy, respectively.  We then used a 6-French guide catheter and an [employer] soft guidewire. We then  predilated with a 3.0 maverick balloon, 8 mm long. We then positioned a 13 x  3.5 Cypher stent and did two balloon inflations to 16 atmospheres. We post  dilated with a 4.0 quantum 12-mm long balloon. The case was terminated with  a good angiographic result. Angiomax was discontinued post procedure. During  the procedure, the ACT was documented to be greater than 300 seconds.   Repeat angio of the left coronary and two additional views were obtained,  demonstrating 80% stenosis proximal to a large first diagonal. There is less  than critical disease beyond the diagonal. The most severe disease and  critical involvement is proximal to the diagonal. It is felt that the LAD  could probably be stented but the diagonal, at least part of which, would  have to be jailed. The patient tolerated the procedure without   complications.   AngioSeal arteriotomy closure was performed with success after sheathogram  was performed.   CONCLUSION:  1.  Successful right coronary artery stent with reduction in 95% stenosis to      0% as noted above. The ultimate treatment device was a Cypher 3.5 x 13      drug-eluting stent.  2.  High-grade left anterior descending disease proximal to a large first      diagonal but also with moderate disease      beyond the diagonal. This could be intervened upon, but would probably      result in jailing of the diagonal although I still think that PCI on      this vessel is the most reasonable approach.   PLAN:  Per Dr. Mayford Knife. PCI of the LAD at some point beyond 2 weeks from now.  Plavix for at least a year.      Lyn Records, M.D.  Electronically Signed     HWS/MEDQ  D:  01/31/2005  T:  01/31/2005  Job:  [id number]   cc:   Bryan Lemma. Manus Gunning, M.D.  Fax: [phone]   Carolanne Grumbling, M.D.

## 2011-01-04 LAB — I-STAT 8, (EC8 V) (CONVERTED LAB)
Acid-Base Excess: 1
BUN: 23
Bicarbonate: 24
Chloride: 108
Glucose, Bld: 92
HCT: 41
Hemoglobin: 13.9
Operator id: 198171
Potassium: 4.3
Sodium: 137
TCO2: 25
pCO2, Ven: 34.2 — ABNORMAL LOW
pH, Ven: 7.455 — ABNORMAL HIGH

## 2011-01-04 LAB — CBC
HCT: 37.1
HCT: 39.2
Hemoglobin: 12.9
Hemoglobin: 13.3
MCHC: 34
MCHC: 34.8
MCV: 89.7
MCV: 89.9
Platelets: 202
Platelets: 217
RBC: 4.13
RBC: 4.37
RDW: 12.2
RDW: 12.5
WBC: 6.9
WBC: 7.8

## 2011-01-04 LAB — TROPONIN I: Troponin I: 0.02

## 2011-01-04 LAB — COMPREHENSIVE METABOLIC PANEL
ALT: 14
AST: 28
Albumin: 3.5
Alkaline Phosphatase: 73
BUN: 20
CO2: 26
Calcium: 9.2
Chloride: 108
Creatinine, Ser: 0.77
GFR calc Af Amer: >60
GFR calc non Af Amer: >60
Glucose, Bld: 125 — ABNORMAL HIGH
Potassium: 3.5
Sodium: 139
Total Bilirubin: 0.7
Total Protein: 6

## 2011-01-04 LAB — POCT CARDIAC MARKERS
CKMB, poc: 1.7
Myoglobin, poc: 89.2
Operator id: 198171
Troponin i, poc: <0.05

## 2011-01-04 LAB — CARDIAC PANEL(CRET KIN+CKTOT+MB+TROPI)
CK, MB: 1.7
Relative Index: INVALID
Total CK: 82
Troponin I: 0.02

## 2011-01-04 LAB — LIPID PANEL
Cholesterol: 157
HDL: 71
LDL Cholesterol: 72
Total CHOL/HDL Ratio: 2.2
Triglycerides: 68
VLDL: 14

## 2011-01-04 LAB — POCT I-STAT CREATININE
Creatinine, Ser: 0.7
Operator id: 198171

## 2011-01-04 LAB — CK TOTAL AND CKMB (NOT AT ARMC)
CK, MB: 2
CK, MB: 2.4
Relative Index: 2.3
Relative Index: INVALID
Total CK: 106
Total CK: 87

## 2011-01-04 LAB — B-NATRIURETIC PEPTIDE (CONVERTED LAB): Pro B Natriuretic peptide (BNP): <30

## 2011-01-04 LAB — D-DIMER, QUANTITATIVE: D-Dimer, Quant: <0.22

## 2011-01-04 LAB — PROTIME-INR
INR: 1
Prothrombin Time: 13.6

## 2011-01-04 LAB — MAGNESIUM: Magnesium: 2.3

## 2011-01-04 LAB — TSH: TSH: 1.181

## 2011-04-19 DIAGNOSIS — L918 Other hypertrophic disorders of the skin: Secondary | ICD-10-CM | POA: Diagnosis not present

## 2011-04-19 DIAGNOSIS — L908 Other atrophic disorders of skin: Secondary | ICD-10-CM | POA: Diagnosis not present

## 2011-04-19 DIAGNOSIS — L821 Other seborrheic keratosis: Secondary | ICD-10-CM | POA: Diagnosis not present

## 2011-04-19 DIAGNOSIS — M7660 Achilles tendinitis, unspecified leg: Secondary | ICD-10-CM | POA: Diagnosis not present

## 2011-04-19 DIAGNOSIS — D045 Carcinoma in situ of skin of trunk: Secondary | ICD-10-CM | POA: Diagnosis not present

## 2011-04-26 DIAGNOSIS — R238 Other skin changes: Secondary | ICD-10-CM | POA: Diagnosis not present

## 2011-04-26 DIAGNOSIS — D485 Neoplasm of uncertain behavior of skin: Secondary | ICD-10-CM | POA: Diagnosis not present

## 2011-05-05 ENCOUNTER — Other Ambulatory Visit: Payer: Self-pay | Admitting: Family Medicine

## 2011-05-05 DIAGNOSIS — Z1231 Encounter for screening mammogram for malignant neoplasm of breast: Secondary | ICD-10-CM

## 2011-05-25 ENCOUNTER — Ambulatory Visit
Admission: RE | Admit: 2011-05-25 | Discharge: 2011-05-25 | Disposition: A | Discharge status: Home / Self Care | Payer: Medicare Other | Source: Ambulatory Visit | Attending: Family Medicine | Admitting: Family Medicine

## 2011-05-25 DIAGNOSIS — Z1231 Encounter for screening mammogram for malignant neoplasm of breast: Secondary | ICD-10-CM | POA: Diagnosis not present

## 2011-06-20 DIAGNOSIS — R5383 Other fatigue: Secondary | ICD-10-CM | POA: Diagnosis not present

## 2011-06-20 DIAGNOSIS — G2589 Other specified extrapyramidal and movement disorders: Secondary | ICD-10-CM | POA: Diagnosis not present

## 2011-06-20 DIAGNOSIS — R5381 Other malaise: Secondary | ICD-10-CM | POA: Diagnosis not present

## 2011-08-02 DIAGNOSIS — C44519 Basal cell carcinoma of skin of other part of trunk: Secondary | ICD-10-CM | POA: Diagnosis not present

## 2011-08-02 DIAGNOSIS — D239 Other benign neoplasm of skin, unspecified: Secondary | ICD-10-CM | POA: Diagnosis not present

## 2011-08-02 DIAGNOSIS — C44621 Squamous cell carcinoma of skin of unspecified upper limb, including shoulder: Secondary | ICD-10-CM | POA: Diagnosis not present

## 2011-08-05 ENCOUNTER — Emergency Department (HOSPITAL_COMMUNITY)
Admission: EM | Admit: 2011-08-05 | Discharge: 2011-08-05 | Disposition: A | Discharge status: Home / Self Care | Payer: Medicare Other | Attending: Emergency Medicine | Admitting: Emergency Medicine

## 2011-08-05 ENCOUNTER — Encounter (HOSPITAL_COMMUNITY): Payer: Self-pay | Admitting: *Deleted

## 2011-08-05 DIAGNOSIS — M129 Arthropathy, unspecified: Secondary | ICD-10-CM | POA: Insufficient documentation

## 2011-08-05 DIAGNOSIS — Z7982 Long term (current) use of aspirin: Secondary | ICD-10-CM | POA: Insufficient documentation

## 2011-08-05 DIAGNOSIS — I10 Essential (primary) hypertension: Secondary | ICD-10-CM | POA: Diagnosis not present

## 2011-08-05 DIAGNOSIS — Z79899 Other long term (current) drug therapy: Secondary | ICD-10-CM | POA: Diagnosis not present

## 2011-08-05 DIAGNOSIS — R609 Edema, unspecified: Secondary | ICD-10-CM | POA: Insufficient documentation

## 2011-08-05 DIAGNOSIS — R600 Localized edema: Secondary | ICD-10-CM

## 2011-08-05 HISTORY — DX: Essential (primary) hypertension: I10

## 2011-08-05 HISTORY — DX: Unspecified osteoarthritis, unspecified site: M19.90

## 2011-08-05 MED ORDER — ENOXAPARIN SODIUM 100 MG/ML ~~LOC~~ SOLN
95.0000 mg | SUBCUTANEOUS | Status: AC
  Administered 2011-08-05: 95 mg via SUBCUTANEOUS
  Filled 2011-08-05: qty 1

## 2011-08-05 NOTE — ED Notes (Signed)
Pt noticed right lower leg swelling today, states feels like it's "going to bust".  Denies CP, n/v, SOB

## 2011-08-05 NOTE — Discharge Instructions (Signed)
Please return to the ER between 8 and 8:30 tomorrow morning to have an ultrasound of your right leg performed.  You should return to the ER sooner if you develop chest pain or shortness of breath.  If your test is negative tomorrow, please follow up with your primary care doctor on Monday.

## 2011-08-06 ENCOUNTER — Ambulatory Visit (HOSPITAL_COMMUNITY)
Admission: RE | Admit: 2011-08-06 | Discharge: 2011-08-06 | Disposition: A | Discharge status: Home / Self Care | Payer: Medicare Other | Source: Ambulatory Visit | Attending: Emergency Medicine | Admitting: Emergency Medicine

## 2011-08-06 DIAGNOSIS — M7989 Other specified soft tissue disorders: Secondary | ICD-10-CM

## 2011-08-06 DIAGNOSIS — M79609 Pain in unspecified limb: Secondary | ICD-10-CM

## 2011-08-06 NOTE — ED Provider Notes (Signed)
History     CSN: [id number]  Arrival date & time 08/05/11  1927   First MD Initiated Contact with Patient 08/05/11 2134      Chief Complaint  Patient presents with  . Leg Swelling    (Consider location/radiation/quality/duration/timing/severity/associated sxs/prior treatment) HPI History provided by pt.   Pt referred from urgent care for r/o RLE DVT.  Developed swelling right lower leg at approx 2pm today.  Noticed immediately after working out in the yard.  Associated w/ pain in calf that she describes as tightness.  Pain aggravated by bearing weight.  Denies fever, rash, CP, SOB, pain/edema LLE.  No known contacts.  No trauma.  No RF for blood clot.  Pt reports that edema has improved since elevating here in ED.   Past Medical History  Diagnosis Date  . Coronary stent occlusion   . Arthritis   . Hypertension     Past Surgical History  Procedure Date  . Rotator cuff repair     History reviewed. No pertinent family history.  History  Substance Use Topics  . Smoking status: Never Smoker   . Smokeless tobacco: Not on file  . Alcohol Use: No    OB History    Grav Para Term Preterm Abortions TAB SAB Ect Mult Living                  Review of Systems  All other systems reviewed and are negative.    Allergies  Lipotropic complex and Prednisone  Home Medications   Current Outpatient Rx  Name Route Sig Dispense Refill  . ASPIRIN EC 81 MG PO TBEC Oral Take 81 mg by mouth daily.    Marland Kitchen VITAMIN D 1000 UNITS PO TABS Oral Take 1,000 Units by mouth daily.    Marland Kitchen CLOPIDOGREL BISULFATE 75 MG PO TABS Oral Take 75 mg by mouth daily.    Marland Kitchen ESOMEPRAZOLE MAGNESIUM 20 MG PO CPDR Oral Take 20 mg by mouth daily before breakfast.    . EZETIMIBE 10 MG PO TABS Oral Take 10 mg by mouth daily.    . OMEGA-3 FATTY ACIDS 1000 MG PO CAPS Oral Take 1 g by mouth 2 (two) times daily.    Marland Kitchen FLAX SEED OIL PO Oral Take 2 capsules by mouth daily.    Marland Kitchen FOLIC ACID 800 MCG PO TABS Oral Take 400 mcg by  mouth daily.    . ISOSORBIDE MONONITRATE ER 60 MG PO TB24 Oral Take 60 mg by mouth daily.    Marland Kitchen METOPROLOL SUCCINATE ER 25 MG PO TB24 Oral Take 25 mg by mouth daily.    Marland Kitchen ROPINIROLE HCL 0.5 MG PO TABS Oral Take 0.5-1 mg by mouth 2 (two) times daily as needed. For restless leg syndrome    . ROSUVASTATIN CALCIUM 10 MG PO TABS Oral Take 10 mg by mouth daily.    . SERTRALINE HCL 50 MG PO TABS Oral Take 50 mg by mouth daily.    Marland Kitchen VITAMIN E 400 UNITS PO CAPS Oral Take 400 Units by mouth daily.      BP 141/77  Pulse 62  Temp(Src) 98.1 F (36.7 C) (Oral)  Resp 18  Wt 212 lb (96.163 kg)  SpO2 99%  Physical Exam  Nursing note and vitals reviewed. Constitutional: She is oriented to person, place, and time. She appears well-developed and well-nourished. No distress.  HENT:  Head: Normocephalic and atraumatic.  Eyes:       Normal appearance  Neck: Normal range of motion.  Cardiovascular: Normal rate and regular rhythm.   Pulmonary/Chest: Effort normal and breath sounds normal. No respiratory distress.  Musculoskeletal: Normal range of motion.       Right distal half of shin w/ mild, blotchy erythema and warmth.  Non-tender.  Significant non-pitting edema of right lower leg compared to left.  Tenderness of calf only.  2+ DP pulse and distal sensation intact.   Neurological: She is alert and oriented to person, place, and time.  Skin: Skin is warm and dry. No rash noted.  Psychiatric: She has a normal mood and affect. Her behavior is normal.    ED Course  Procedures (including critical care time)  Labs Reviewed - No data to display No results found.   1. Edema of right lower extremity       MDM  Pt presents w/ non-traumatic edema and pain RLE.  Low risk but exam findings concerning for DVT.  There is blotchy erythema and warmth of shin but doubt cellulitis because afebrile and no pain or tenderness anteriorly.  Denies CP/SOB and VS w/in nml range.   Pt received a dose of lovenox and  venous doppler has been ordered for tomorrow morning.  Strict return precautions discussed and pt advised to f/u with PCP Monday if study neg.        Arie Sabina [place], [place] 08/06/11 1345

## 2011-08-06 NOTE — Progress Notes (Signed)
*  PRELIMINARY RESULTS* Vascular Ultrasound Right lower extremity venous duplex has been completed.  Preliminary findings: Right= no evidence of DVT or baker's cyst.  Farrel Demark , RDMS 08/06/2011, 9:17 AM

## 2011-08-08 DIAGNOSIS — R609 Edema, unspecified: Secondary | ICD-10-CM | POA: Diagnosis not present

## 2011-08-09 DIAGNOSIS — E7800 Pure hypercholesterolemia, unspecified: Secondary | ICD-10-CM | POA: Diagnosis not present

## 2011-08-09 DIAGNOSIS — Z79899 Other long term (current) drug therapy: Secondary | ICD-10-CM | POA: Diagnosis not present

## 2011-08-10 NOTE — ED Provider Notes (Signed)
Medical screening examination/treatment/procedure(s) were performed by non-physician practitioner and as supervising physician I was immediately available for consultation/collaboration.   Carleene Cooper III, MD 08/10/11 [phone]

## 2011-08-11 DIAGNOSIS — I2510 Atherosclerotic heart disease of native coronary artery without angina pectoris: Secondary | ICD-10-CM | POA: Diagnosis not present

## 2011-08-11 DIAGNOSIS — I10 Essential (primary) hypertension: Secondary | ICD-10-CM | POA: Diagnosis not present

## 2011-08-11 DIAGNOSIS — Z79899 Other long term (current) drug therapy: Secondary | ICD-10-CM | POA: Diagnosis not present

## 2011-08-11 DIAGNOSIS — E7800 Pure hypercholesterolemia, unspecified: Secondary | ICD-10-CM | POA: Diagnosis not present

## 2011-08-25 DIAGNOSIS — M25569 Pain in unspecified knee: Secondary | ICD-10-CM | POA: Diagnosis not present

## 2011-08-30 DIAGNOSIS — C44519 Basal cell carcinoma of skin of other part of trunk: Secondary | ICD-10-CM | POA: Diagnosis not present

## 2011-10-07 DIAGNOSIS — Z23 Encounter for immunization: Secondary | ICD-10-CM | POA: Diagnosis not present

## 2011-10-07 DIAGNOSIS — I2510 Atherosclerotic heart disease of native coronary artery without angina pectoris: Secondary | ICD-10-CM | POA: Diagnosis not present

## 2011-10-07 DIAGNOSIS — I10 Essential (primary) hypertension: Secondary | ICD-10-CM | POA: Diagnosis not present

## 2011-10-07 DIAGNOSIS — F329 Major depressive disorder, single episode, unspecified: Secondary | ICD-10-CM | POA: Diagnosis not present

## 2011-10-07 DIAGNOSIS — G2589 Other specified extrapyramidal and movement disorders: Secondary | ICD-10-CM | POA: Diagnosis not present

## 2011-10-07 DIAGNOSIS — Z1211 Encounter for screening for malignant neoplasm of colon: Secondary | ICD-10-CM | POA: Diagnosis not present

## 2011-10-07 DIAGNOSIS — E7800 Pure hypercholesterolemia, unspecified: Secondary | ICD-10-CM | POA: Diagnosis not present

## 2011-10-11 DIAGNOSIS — C44519 Basal cell carcinoma of skin of other part of trunk: Secondary | ICD-10-CM | POA: Diagnosis not present

## 2011-10-12 DIAGNOSIS — Z1211 Encounter for screening for malignant neoplasm of colon: Secondary | ICD-10-CM | POA: Diagnosis not present

## 2012-01-04 DIAGNOSIS — IMO0002 Reserved for concepts with insufficient information to code with codable children: Secondary | ICD-10-CM | POA: Diagnosis not present

## 2012-01-04 DIAGNOSIS — M1710 Unilateral primary osteoarthritis, unspecified knee: Secondary | ICD-10-CM | POA: Diagnosis not present

## 2012-02-02 ENCOUNTER — Encounter (HOSPITAL_COMMUNITY): Payer: Self-pay | Admitting: Emergency Medicine

## 2012-02-02 ENCOUNTER — Emergency Department (HOSPITAL_COMMUNITY): Payer: Medicare Other

## 2012-02-02 ENCOUNTER — Emergency Department (HOSPITAL_COMMUNITY)
Admission: EM | Admit: 2012-02-02 | Discharge: 2012-02-02 | Disposition: A | Discharge status: Home / Self Care | Payer: Medicare Other | Attending: Emergency Medicine | Admitting: Emergency Medicine

## 2012-02-02 DIAGNOSIS — Z7982 Long term (current) use of aspirin: Secondary | ICD-10-CM | POA: Diagnosis not present

## 2012-02-02 DIAGNOSIS — I10 Essential (primary) hypertension: Secondary | ICD-10-CM | POA: Diagnosis not present

## 2012-02-02 DIAGNOSIS — G459 Transient cerebral ischemic attack, unspecified: Secondary | ICD-10-CM | POA: Diagnosis not present

## 2012-02-02 DIAGNOSIS — Y831 Surgical operation with implant of artificial internal device as the cause of abnormal reaction of the patient, or of later complication, without mention of misadventure at the time of the procedure: Secondary | ICD-10-CM | POA: Insufficient documentation

## 2012-02-02 DIAGNOSIS — T82897A Other specified complication of cardiac prosthetic devices, implants and grafts, initial encounter: Secondary | ICD-10-CM | POA: Insufficient documentation

## 2012-02-02 DIAGNOSIS — M129 Arthropathy, unspecified: Secondary | ICD-10-CM | POA: Diagnosis not present

## 2012-02-02 DIAGNOSIS — R5383 Other fatigue: Secondary | ICD-10-CM | POA: Diagnosis not present

## 2012-02-02 DIAGNOSIS — Z79899 Other long term (current) drug therapy: Secondary | ICD-10-CM | POA: Diagnosis not present

## 2012-02-02 DIAGNOSIS — R5381 Other malaise: Secondary | ICD-10-CM | POA: Diagnosis not present

## 2012-02-02 LAB — POCT I-STAT TROPONIN I: Troponin i, poc: 0.01 ng/mL (ref 0.00–0.08)

## 2012-02-02 LAB — CBC WITH DIFFERENTIAL/PLATELET
Basophils Absolute: 0 10*3/uL (ref 0.0–0.1)
Basophils Relative: 1 % (ref 0–1)
Eosinophils Absolute: 0.3 10*3/uL (ref 0.0–0.7)
Eosinophils Relative: 4 % (ref 0–5)
HCT: 44.2 % (ref 36.0–46.0)
Hemoglobin: 14.9 g/dL (ref 12.0–15.0)
Lymphocytes Relative: 30 % (ref 12–46)
Lymphs Abs: 2.6 10*3/uL (ref 0.7–4.0)
MCH: 30.4 pg (ref 26.0–34.0)
MCHC: 33.7 g/dL (ref 30.0–36.0)
MCV: 90.2 fL (ref 78.0–100.0)
Monocytes Absolute: 0.7 10*3/uL (ref 0.1–1.0)
Monocytes Relative: 8 % (ref 3–12)
Neutro Abs: 4.8 10*3/uL (ref 1.7–7.7)
Neutrophils Relative %: 57 % (ref 43–77)
Platelets: 250 10*3/uL (ref 150–400)
RBC: 4.9 MIL/uL (ref 3.87–5.11)
RDW: 13 % (ref 11.5–15.5)
WBC: 8.4 10*3/uL (ref 4.0–10.5)

## 2012-02-02 LAB — BASIC METABOLIC PANEL
BUN: 16 mg/dL (ref 6–23)
CO2: 26 mEq/L (ref 19–32)
Calcium: 10.7 mg/dL — ABNORMAL HIGH (ref 8.4–10.5)
Chloride: 101 mEq/L (ref 96–112)
Creatinine, Ser: 0.68 mg/dL (ref 0.50–1.10)
GFR calc Af Amer: >90 mL/min (ref >90)
GFR calc non Af Amer: 89 mL/min — ABNORMAL LOW (ref >90)
Glucose, Bld: 86 mg/dL (ref 70–99)
Potassium: 4 mEq/L (ref 3.5–5.1)
Sodium: 137 mEq/L (ref 135–145)

## 2012-02-02 LAB — URINALYSIS, ROUTINE W REFLEX MICROSCOPIC
Bilirubin Urine: NEGATIVE
Glucose, UA: NEGATIVE mg/dL
Hgb urine dipstick: NEGATIVE
Ketones, ur: NEGATIVE mg/dL
Leukocytes, UA: NEGATIVE
Nitrite: NEGATIVE
Protein, ur: NEGATIVE mg/dL
Specific Gravity, Urine: 1.003 — ABNORMAL LOW (ref 1.005–1.030)
Urobilinogen, UA: 0.2 mg/dL (ref 0.0–1.0)
pH: 6.5 (ref 5.0–8.0)

## 2012-02-02 LAB — GLUCOSE, CAPILLARY: Glucose-Capillary: 78 mg/dL (ref 70–99)

## 2012-02-02 NOTE — ED Provider Notes (Signed)
History     CSN: [id number]  Arrival date & time 02/02/12  1211   First MD Initiated Contact with Patient 02/02/12 1503      Chief Complaint  Patient presents with  . Weakness    Patient is a 66 y.o. female presenting with weakness. The history is provided by the patient.  Weakness The primary symptoms include focal weakness. Primary symptoms do not include vomiting. Episode onset: 3 weeks ago. The episode lasted 30 minutes. The symptoms are resolved. The neurological symptoms are focal.  Additional symptoms include weakness. Additional symptoms do not include pain.  Pt has noticed that her left hand and foot have intermittently been weak.  She has also noticed difficulty with her speech when this occurs.  The speech is slurred.  This has been happening 2-3 times per day.  She talked to her sister who instructed her to get it checked out and that is why she decided to come to the ED.  Right now she has no symptoms.  Last episode was this morning. No history of stroke or TIA.  Past Medical History  Diagnosis Date  . Coronary stent occlusion   . Arthritis   . Hypertension     Past Surgical History  Procedure Date  . Rotator cuff repair     History reviewed. No pertinent family history.  History  Substance Use Topics  . Smoking status: Never Smoker   . Smokeless tobacco: Not on file  . Alcohol Use: No    OB History    Grav Para Term Preterm Abortions TAB SAB Ect Mult Living                  Review of Systems  Gastrointestinal: Negative for vomiting.  Neurological: Positive for focal weakness and weakness.  All other systems reviewed and are negative.    Allergies  Lipitor and Prednisone  Home Medications   Current Outpatient Rx  Name  Route  Sig  Dispense  Refill  . ASPIRIN EC 81 MG PO TBEC   Oral   Take 81 mg by mouth every evening.          Marland Kitchen VITAMIN D 1000 UNITS PO TABS   Oral   Take 1,000 Units by mouth daily.         Marland Kitchen CLOPIDOGREL BISULFATE 75  MG PO TABS   Oral   Take 75 mg by mouth every evening.          Marland Kitchen ESOMEPRAZOLE MAGNESIUM 20 MG PO CPDR   Oral   Take 20 mg by mouth daily before breakfast.         . EZETIMIBE 10 MG PO TABS   Oral   Take 10 mg by mouth every evening.          Marland Kitchen OMEGA-3 FATTY ACIDS 1000 MG PO CAPS   Oral   Take 1 g by mouth 2 (two) times daily.         Marland Kitchen FLAX SEED OIL PO   Oral   Take 2 capsules by mouth daily.         Marland Kitchen FOLIC ACID 800 MCG PO TABS   Oral   Take 400 mcg by mouth daily.         . ISOSORBIDE MONONITRATE ER 60 MG PO TB24   Oral   Take 60 mg by mouth daily.         Marland Kitchen METOPROLOL SUCCINATE ER 25 MG PO TB24   Oral  Take 25 mg by mouth daily.         . ADULT MULTIVITAMIN W/MINERALS CH   Oral   Take 1 tablet by mouth daily.         Marland Kitchen ROPINIROLE HCL 0.5 MG PO TABS   Oral   Take 0.5-1 mg by mouth 2 (two) times daily as needed. For restless leg syndrome         . ROSUVASTATIN CALCIUM 10 MG PO TABS   Oral   Take 10 mg by mouth daily.         . SERTRALINE HCL 50 MG PO TABS   Oral   Take 50 mg by mouth daily.         Marland Kitchen VITAMIN E 400 UNITS PO CAPS   Oral   Take 400 Units by mouth daily.           BP 117/72  Pulse 57  Temp 98.6 F (37 C) (Oral)  Resp 13  SpO2 94%  Physical Exam  Nursing note and vitals reviewed. Constitutional: She is oriented to person, place, and time. She appears well-developed and well-nourished. No distress.  HENT:  Head: Normocephalic and atraumatic.  Right Ear: External ear normal.  Left Ear: External ear normal.  Mouth/Throat: Oropharynx is clear and moist.  Eyes: Conjunctivae normal are normal. Right eye exhibits no discharge. Left eye exhibits no discharge. No scleral icterus.  Neck: Neck supple. No tracheal deviation present.       Negative for bruit bilaterally  Cardiovascular: Normal rate, regular rhythm and intact distal pulses.   Pulmonary/Chest: Effort normal and breath sounds normal. No stridor. No  respiratory distress. She has no wheezes. She has no rales.  Abdominal: Soft. Bowel sounds are normal. She exhibits no distension. There is no tenderness. There is no rebound and no guarding.  Musculoskeletal: She exhibits no edema and no tenderness.  Neurological: She is alert and oriented to person, place, and time. She has normal strength. No cranial nerve deficit ( no gross defecits noted) or sensory deficit. She exhibits normal muscle tone. She displays no seizure activity. Coordination normal.       No pronator drift bilateral upper extrem, able to hold both legs off bed for 5 seconds, sensation intact in all extremities, no visual field cuts, no left or right sided neglect  Skin: Skin is warm and dry. No rash noted.  Psychiatric: She has a normal mood and affect.    ED Course  Procedures (including critical care time) EKG 57 Sinus bradycardia Left ventricular hypertrophy Abnormal ECG  Labs Reviewed  BASIC METABOLIC PANEL - Abnormal; Notable for the following:    Calcium 10.7 (*)     GFR calc non Af Amer 89 (*)     All other components within normal limits  URINALYSIS, ROUTINE W REFLEX MICROSCOPIC - Abnormal; Notable for the following:    Specific Gravity, Urine 1.003 (*)     All other components within normal limits  CBC WITH DIFFERENTIAL  POCT I-STAT TROPONIN I  GLUCOSE, CAPILLARY   Dg Chest 2 View  02/02/2012  *RADIOLOGY REPORT*  Clinical Data: Slurred speech.  Left arm and leg numbness. Hypertension.  CHEST - 2 VIEW  Comparison: 02/20/2007.  Findings: Normal sized heart.  Clear lungs with normal vascularity. Thoracic spine degenerative changes and mild scoliosis. Cholecystectomy clips.  IMPRESSION: No acute abnormality.   Original Report Authenticated By: Beckie Salts, M.D.       MDM  ABCD2 score of 5.  I discussed the findings with the patient.  I am concerned about the possibility of TIA.  I recommend overnight stay for further evaluation. Pt does not want to stay and  be admitted to the hospital.  I explained that she has a 10% chance of stroke within the next 30 days.  Pt understands this risk and would like to see her doctor tomorrow to arrange outpatient testing.  Pt understands she can return at any time if she changes her mind.     Celene Kras, MD 02/02/12 [phone]

## 2012-02-02 NOTE — ED Notes (Signed)
Pt to radiology at this time.

## 2012-02-02 NOTE — ED Notes (Signed)
Patient back from radiology.

## 2012-02-02 NOTE — ED Notes (Signed)
Pt c/o intermittent left sided weakness and numbness several times a day for 3 weeks that resolve; pt sts trouble talking during events; no sx at present

## 2012-02-02 NOTE — ED Notes (Signed)
Notified RN of CBG 78

## 2012-02-02 NOTE — ED Notes (Signed)
Patient transported to X-ray 

## 2012-02-02 NOTE — ED Notes (Signed)
CT contacted in regards to delay in head CT, stated they were behind and would get patient soon.

## 2012-02-06 DIAGNOSIS — Z1331 Encounter for screening for depression: Secondary | ICD-10-CM | POA: Diagnosis not present

## 2012-02-06 DIAGNOSIS — G459 Transient cerebral ischemic attack, unspecified: Secondary | ICD-10-CM | POA: Diagnosis not present

## 2012-02-10 DIAGNOSIS — G459 Transient cerebral ischemic attack, unspecified: Secondary | ICD-10-CM | POA: Diagnosis not present

## 2012-02-17 DIAGNOSIS — G459 Transient cerebral ischemic attack, unspecified: Secondary | ICD-10-CM | POA: Diagnosis not present

## 2012-02-17 DIAGNOSIS — Z79899 Other long term (current) drug therapy: Secondary | ICD-10-CM | POA: Diagnosis not present

## 2012-02-17 DIAGNOSIS — E7800 Pure hypercholesterolemia, unspecified: Secondary | ICD-10-CM | POA: Diagnosis not present

## 2012-02-17 DIAGNOSIS — I10 Essential (primary) hypertension: Secondary | ICD-10-CM | POA: Diagnosis not present

## 2012-02-17 DIAGNOSIS — I2510 Atherosclerotic heart disease of native coronary artery without angina pectoris: Secondary | ICD-10-CM | POA: Diagnosis not present

## 2012-02-27 DIAGNOSIS — G459 Transient cerebral ischemic attack, unspecified: Secondary | ICD-10-CM | POA: Diagnosis not present

## 2012-02-27 DIAGNOSIS — E7800 Pure hypercholesterolemia, unspecified: Secondary | ICD-10-CM | POA: Diagnosis not present

## 2012-02-27 DIAGNOSIS — Z79899 Other long term (current) drug therapy: Secondary | ICD-10-CM | POA: Diagnosis not present

## 2012-02-27 DIAGNOSIS — I2510 Atherosclerotic heart disease of native coronary artery without angina pectoris: Secondary | ICD-10-CM | POA: Diagnosis not present

## 2012-02-27 DIAGNOSIS — I10 Essential (primary) hypertension: Secondary | ICD-10-CM | POA: Diagnosis not present

## 2012-02-28 DIAGNOSIS — G459 Transient cerebral ischemic attack, unspecified: Secondary | ICD-10-CM | POA: Diagnosis not present

## 2012-02-28 DIAGNOSIS — Z79899 Other long term (current) drug therapy: Secondary | ICD-10-CM | POA: Diagnosis not present

## 2012-02-28 DIAGNOSIS — E7800 Pure hypercholesterolemia, unspecified: Secondary | ICD-10-CM | POA: Diagnosis not present

## 2012-02-28 DIAGNOSIS — I10 Essential (primary) hypertension: Secondary | ICD-10-CM | POA: Diagnosis not present

## 2012-02-28 DIAGNOSIS — I2510 Atherosclerotic heart disease of native coronary artery without angina pectoris: Secondary | ICD-10-CM | POA: Diagnosis not present

## 2012-05-15 ENCOUNTER — Other Ambulatory Visit: Payer: Self-pay | Admitting: Family Medicine

## 2012-05-15 DIAGNOSIS — Z1231 Encounter for screening mammogram for malignant neoplasm of breast: Secondary | ICD-10-CM

## 2012-05-17 DIAGNOSIS — B356 Tinea cruris: Secondary | ICD-10-CM | POA: Diagnosis not present

## 2012-05-17 DIAGNOSIS — B354 Tinea corporis: Secondary | ICD-10-CM | POA: Diagnosis not present

## 2012-05-18 DIAGNOSIS — H2510 Age-related nuclear cataract, unspecified eye: Secondary | ICD-10-CM | POA: Diagnosis not present

## 2012-05-18 DIAGNOSIS — H02839 Dermatochalasis of unspecified eye, unspecified eyelid: Secondary | ICD-10-CM | POA: Diagnosis not present

## 2012-05-28 DIAGNOSIS — H2510 Age-related nuclear cataract, unspecified eye: Secondary | ICD-10-CM | POA: Diagnosis not present

## 2012-05-31 DIAGNOSIS — B359 Dermatophytosis, unspecified: Secondary | ICD-10-CM | POA: Diagnosis not present

## 2012-06-04 DIAGNOSIS — H2510 Age-related nuclear cataract, unspecified eye: Secondary | ICD-10-CM | POA: Diagnosis not present

## 2012-06-04 DIAGNOSIS — H269 Unspecified cataract: Secondary | ICD-10-CM | POA: Diagnosis not present

## 2012-06-11 ENCOUNTER — Ambulatory Visit
Admission: RE | Admit: 2012-06-11 | Discharge: 2012-06-11 | Disposition: A | Discharge status: Home / Self Care | Payer: Medicare Other | Source: Ambulatory Visit | Attending: Family Medicine | Admitting: Family Medicine

## 2012-06-11 DIAGNOSIS — Z1231 Encounter for screening mammogram for malignant neoplasm of breast: Secondary | ICD-10-CM | POA: Diagnosis not present

## 2012-07-17 DIAGNOSIS — G988 Other disorders of nervous system: Secondary | ICD-10-CM | POA: Diagnosis not present

## 2012-07-31 ENCOUNTER — Other Ambulatory Visit: Payer: Self-pay | Admitting: Diagnostic Neuroimaging

## 2012-07-31 ENCOUNTER — Ambulatory Visit (INDEPENDENT_AMBULATORY_CARE_PROVIDER_SITE_OTHER): Payer: Medicare Other | Admitting: Diagnostic Neuroimaging

## 2012-07-31 ENCOUNTER — Telehealth: Payer: Self-pay | Admitting: Diagnostic Neuroimaging

## 2012-07-31 ENCOUNTER — Encounter: Payer: Self-pay | Admitting: Diagnostic Neuroimaging

## 2012-07-31 VITALS — BP 157/87 | HR 53 | Temp 97.8°F | Ht 65.0 in | Wt 232.0 lb

## 2012-07-31 DIAGNOSIS — M62838 Other muscle spasm: Secondary | ICD-10-CM | POA: Diagnosis not present

## 2012-07-31 DIAGNOSIS — G459 Transient cerebral ischemic attack, unspecified: Secondary | ICD-10-CM

## 2012-07-31 NOTE — Patient Instructions (Addendum)
Will check labs today and order MRI brain.  Follow up in office after test results available.

## 2012-07-31 NOTE — Telephone Encounter (Signed)
Arlene from our clinic [facility] called and is needing a new order for GAD 65- auto antibody.  Done.   (lab order [phone]).

## 2012-07-31 NOTE — Progress Notes (Signed)
[facility]  PATIENT: [name] DOB: August 30, 1945  REFERRING CLINICIAN: Ehinger HISTORY FROM: patient and daughter REASON FOR VISIT: Stroke symptoms   HISTORICAL  HISTORY OF PRESENT ILLNESS:   67 yo Caucasian female with hypertension, hyperchol, CAD, here for eval of intermittent episodes of trouble speaking, described as "thick tongue" and feeling like the toes on the left foot want to curl up.   She called her PCP in November 2013 when it first occurred and he sent her to the ER.  When she went to the ER, he left hand was "stiff" as well.  Head CT was negative and other ordered tests were negative.  These episodes have continued to occur approx. every two weeks since then, the last occurring  2 weeks ago.  She states when episode occurs, people have trouble understanding her.  Patient denies weakness or numbness during these episodes.  She has been advised by family to get "checked out."  REVIEW OF SYSTEMS: Full 14 system review of systems performed and notable only for insomnia, slurred speech  ALLERGIES: Allergies  Allergen Reactions  . Lipitor (Atorvastatin)     "does not like to take"  . Prednisone Other (See Comments)    Can't sleep    HOME MEDICATIONS: Outpatient Prescriptions Prior to Visit  Medication Sig Dispense Refill  . aspirin EC 81 MG tablet Take 81 mg by mouth every evening.       . cholecalciferol (VITAMIN D) 1000 UNITS tablet Take 1,000 Units by mouth daily.      . clopidogrel (PLAVIX) 75 MG tablet Take 75 mg by mouth every evening.       Marland Kitchen esomeprazole (NEXIUM) 20 MG capsule Take 20 mg by mouth daily before breakfast.      . ezetimibe (ZETIA) 10 MG tablet Take 10 mg by mouth every evening.       . fish oil-omega-3 fatty acids 1000 MG capsule Take 1 g by mouth 2 (two) times daily.      . Flaxseed, Linseed, (FLAX SEED OIL PO) Take 2 capsules by mouth daily.      . folic acid (FOLVITE) 800 MCG tablet Take 400 mcg by mouth daily.       . isosorbide mononitrate (IMDUR) 60 MG 24 hr tablet Take 60 mg by mouth daily.      . metoprolol succinate (TOPROL-XL) 25 MG 24 hr tablet Take 25 mg by mouth daily.      . Multiple Vitamin (MULTIVITAMIN WITH MINERALS) TABS Take 1 tablet by mouth daily.      Marland Kitchen rOPINIRole (REQUIP) 0.5 MG tablet Take 0.5-1 mg by mouth 2 (two) times daily as needed. For restless leg syndrome      . rosuvastatin (CRESTOR) 10 MG tablet Take 10 mg by mouth daily.      . sertraline (ZOLOFT) 50 MG tablet Take 50 mg by mouth daily.      . vitamin E 400 UNIT capsule Take 400 Units by mouth daily.       No facility-administered medications prior to visit.    PAST MEDICAL HISTORY: Past Medical History  Diagnosis Date  . Coronary stent occlusion   . Arthritis   . Hypertension   . Hyperlipemia   . Heart disease     PAST SURGICAL HISTORY: Past Surgical History  Procedure Laterality Date  . Rotator cuff repair Left     shoulder  . Cholecystectomy    . Lumbar disc surgery    . Tendon repair Right   .  Coronary stent placement      x2    FAMILY HISTORY: Family History  Problem Relation Age of Onset  . Heart Problems Father     SOCIAL HISTORY:  History   Social History  . Marital Status: Married    Spouse Name: Stephannie Peters    Number of Children: 1  . Years of Education: GED   Occupational History  . Retired    Social History Main Topics  . Smoking status: Never Smoker   . Smokeless tobacco: Never Used  . Alcohol Use: No  . Drug Use: No  . Sexually Active: Not on file   Other Topics Concern  . Not on file   Social History Narrative   Pt lives at home alone.   Caffeine: 2-3 cups of coffee daily     PHYSICAL EXAM  Filed Vitals:   07/31/12 0925  BP: 157/87  Pulse: 53  Temp: 97.8 F (36.6 C)  TempSrc: Oral  Height: 5\' 5"  (1.651 m)  Weight: 232 lb (105.235 kg)   Body mass index is 38.61 kg/(m^2).  GENERAL EXAM: Patient is in no distress  CARDIOVASCULAR: Regular rate and rhythm,  no murmurs, no carotid bruits  NEUROLOGIC: MENTAL STATUS: awake, alert, language fluent, comprehension intact, naming intact CRANIAL NERVE: no papilledema on fundoscopic exam, pupils equal and reactive to light, visual fields full to confrontation, extraocular muscles intact, no nystagmus, facial sensation and strength symmetric, uvula midline, shoulder shrug symmetric, tongue midline. MOTOR: normal bulk and tone, full strength in the BUE, BLE SENSORY: normal and symmetric to light touch, pinprick, temperature, vibration and proprioception COORDINATION: finger-nose-finger, fine finger movements normal REFLEXES: deep tendon reflexes present and symmetric (BUE 1, KNEES 2, ANKLES 1). GAIT/STATION: ANTALGIC GAIT. Narrow based gait; able to walk on toes, heels and tandem; romberg is negative   DIAGNOSTIC DATA (LABS, IMAGING, TESTING) - I reviewed patient records, labs, notes, testing and imaging myself where available.  Lab Results  Component Value Date   WBC 8.4 02/02/2012   HGB 14.9 02/02/2012   HCT 44.2 02/02/2012   MCV 90.2 02/02/2012   PLT 250 02/02/2012      Component Value Date/Time   NA 137 02/02/2012 1231   K 4.0 02/02/2012 1231   CL 101 02/02/2012 1231   CO2 26 02/02/2012 1231   GLUCOSE 86 02/02/2012 1231   BUN 16 02/02/2012 1231   CREATININE 0.68 02/02/2012 1231   CALCIUM 10.7* 02/02/2012 1231   PROT 6.0 02/20/2007 2220   ALBUMIN 3.5 02/20/2007 2220   AST 28 02/20/2007 2220   ALT 14 02/20/2007 2220   ALKPHOS 73 02/20/2007 2220   BILITOT 0.7 02/20/2007 2220   GFRNONAA 89* 02/02/2012 1231   GFRAA >90 02/02/2012 1231   Lab Results  Component Value Date   CHOL  Value: 157        ATP III CLASSIFICATION:  <200     mg/dL   Desirable  454-098  mg/dL   Borderline High  >=119    mg/dL   High 14/78/2956   HDL 71 02/21/2007   LDLCALC  Value: 72        Total Cholesterol/HDL:CHD Risk Coronary Heart Disease Risk Table                     Men   Women  1/2 Average Risk   3.4   3.3 02/21/2007    TRIG 68 02/21/2007   CHOLHDL 2.2 02/21/2007   No results found for this basename: HGBA1C  No results found for this basename: VITAMINB12     02/02/12 CT head - mild subcortical atrophy, mild chronic small vessel ischemic disease   02/10/12 Carotid u/s - right ICA < 20% stenosis; left ICA 20-30% stenosis  02/27/12 - 03/27/12 Cardiac monitor - normal sinus rhythm  02/28/12 TTE - EF 69%; no source of emboli   ASSESSMENT   67 y.o. year old female with intermittent episodes of tongue thickness, left foot/toes curling. Time course and high frequency of events make TIA/stroke less likely.  Localization: lower brainstem, cranial nerve, neuromuscular junction, muscle  Ddx: CNS vascular, inflammatory, structural, neuropathy, myopathy, myotonic dystrophy, stiff-person syndrome  PLAN: 1. MRI brain 2. Labs 3. Consider EMG/NCS in future if above testing unremarkable and symptoms continue  Suanne Marker, MD (with LYNN LAM NP-C) 07/31/2012, 9:55 AM Certified in Neurology, Neurophysiology and Neuroimaging  [facility] [address], [address] [place], [place] [address] [phone]

## 2012-08-01 LAB — CK: Total CK: 84 U/L (ref 24–173)

## 2012-08-01 LAB — ACETYLCHOLINE RECEPTOR, BINDING: AChR Binding Ab, Serum: 0.03 nmol/L (ref 0.00–0.24)

## 2012-08-02 ENCOUNTER — Telehealth: Payer: Self-pay | Admitting: *Deleted

## 2012-08-02 LAB — GAD-65 AUTOANTIBODY: Glutamic Acid Decarb Ab: <1 U/mL (ref 0.0–1.5)

## 2012-08-02 NOTE — Telephone Encounter (Signed)
Spoke with patient and relayed results of blood work.  Patient understood and had no questions.

## 2012-08-02 NOTE — Telephone Encounter (Signed)
Message copied by Avie Echevaria on Thu Aug 02, 2012  9:58 AM ------      Message from: Joycelyn Schmid      Created: Thu Aug 02, 2012  9:36 AM       pls call pt.             ----- Message -----         From: Ronal Fear, NP         Sent: 08/01/2012   9:47 PM           To: Suanne Marker, MD            Within normal limits; expected result.       ------

## 2012-08-08 ENCOUNTER — Ambulatory Visit
Admission: RE | Admit: 2012-08-08 | Discharge: 2012-08-08 | Disposition: A | Discharge status: Home / Self Care | Payer: Medicare Other | Source: Ambulatory Visit | Attending: Nurse Practitioner | Admitting: Nurse Practitioner

## 2012-08-08 DIAGNOSIS — G459 Transient cerebral ischemic attack, unspecified: Secondary | ICD-10-CM

## 2012-08-09 ENCOUNTER — Telehealth: Payer: Self-pay | Admitting: Neurology

## 2012-08-09 NOTE — Telephone Encounter (Signed)
Message copied by Elisha Headland on Thu Aug 09, 2012  4:43 PM ------      Message from: Joycelyn Schmid      Created: Thu Aug 09, 2012  3:14 PM       Call pt with results. -VRP            ----- Message -----         From: Ronal Fear, NP         Sent: 08/09/2012   1:00 PM           To: Suanne Marker, MD            Nothing acute to explain symptoms.       ------

## 2012-08-10 ENCOUNTER — Telehealth: Payer: Self-pay | Admitting: Diagnostic Neuroimaging

## 2012-08-10 NOTE — Progress Notes (Signed)
Quick Note:  Left message for patient to call back to get results. ______

## 2012-08-10 NOTE — Telephone Encounter (Signed)
Left message for daughter Luster Landsberg, that nothing acute on MRI to explain symptoms, per Larita Fife.  Told to call with further questions.

## 2012-08-10 NOTE — Telephone Encounter (Signed)
Left message for daughter Luster Landsberg, with MRI results, per Dr. Marjory Lies and Larita Fife.  Told to call if she had further questions.

## 2012-08-17 ENCOUNTER — Telehealth: Payer: Self-pay | Admitting: Diagnostic Neuroimaging

## 2012-08-22 ENCOUNTER — Telehealth: Payer: Self-pay | Admitting: Neurology

## 2012-08-22 NOTE — Progress Notes (Signed)
Sent to triage pool to inquire about missing result. 

## 2012-08-22 NOTE — Telephone Encounter (Signed)
I called daughter to get more info. No answer. Will see patient at next visit. -VRP
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
## 2012-08-24 DIAGNOSIS — E7800 Pure hypercholesterolemia, unspecified: Secondary | ICD-10-CM | POA: Diagnosis not present

## 2012-08-24 DIAGNOSIS — Z79899 Other long term (current) drug therapy: Secondary | ICD-10-CM | POA: Diagnosis not present

## 2012-08-24 DIAGNOSIS — I10 Essential (primary) hypertension: Secondary | ICD-10-CM | POA: Diagnosis not present

## 2012-08-24 DIAGNOSIS — I2510 Atherosclerotic heart disease of native coronary artery without angina pectoris: Secondary | ICD-10-CM | POA: Diagnosis not present

## 2012-08-29 ENCOUNTER — Ambulatory Visit (INDEPENDENT_AMBULATORY_CARE_PROVIDER_SITE_OTHER): Payer: Medicare Other | Admitting: Diagnostic Neuroimaging

## 2012-08-29 ENCOUNTER — Encounter: Payer: Self-pay | Admitting: Diagnostic Neuroimaging

## 2012-08-29 VITALS — BP 149/84 | HR 56 | Temp 97.7°F | Ht 65.0 in | Wt 214.0 lb

## 2012-08-29 DIAGNOSIS — R413 Other amnesia: Secondary | ICD-10-CM | POA: Diagnosis not present
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
## 2012-08-29 NOTE — Patient Instructions (Signed)
Caution with driving; should probably transition away from driving.

## 2012-08-30 LAB — TSH: TSH: 1.51 u[IU]/mL (ref 0.450–4.500)

## 2012-08-31 ENCOUNTER — Telehealth: Payer: Self-pay | Admitting: Diagnostic Neuroimaging

## 2012-08-31 NOTE — Telephone Encounter (Signed)
Please add the information given to check out to the permament comment.

## 2012-09-03 DIAGNOSIS — Z79899 Other long term (current) drug therapy: Secondary | ICD-10-CM | POA: Diagnosis not present

## 2012-10-08 DIAGNOSIS — F329 Major depressive disorder, single episode, unspecified: Secondary | ICD-10-CM | POA: Diagnosis not present

## 2012-10-08 DIAGNOSIS — E7800 Pure hypercholesterolemia, unspecified: Secondary | ICD-10-CM | POA: Diagnosis not present

## 2012-10-08 DIAGNOSIS — Z1211 Encounter for screening for malignant neoplasm of colon: Secondary | ICD-10-CM | POA: Diagnosis not present

## 2012-10-08 DIAGNOSIS — G2589 Other specified extrapyramidal and movement disorders: Secondary | ICD-10-CM | POA: Diagnosis not present

## 2012-10-08 DIAGNOSIS — I10 Essential (primary) hypertension: Secondary | ICD-10-CM | POA: Diagnosis not present

## 2012-10-08 DIAGNOSIS — I2510 Atherosclerotic heart disease of native coronary artery without angina pectoris: Secondary | ICD-10-CM | POA: Diagnosis not present

## 2012-10-17 DIAGNOSIS — Z1211 Encounter for screening for malignant neoplasm of colon: Secondary | ICD-10-CM | POA: Diagnosis not present

## 2012-11-14 DIAGNOSIS — M25569 Pain in unspecified knee: Secondary | ICD-10-CM | POA: Diagnosis not present

## 2012-11-14 DIAGNOSIS — M1710 Unilateral primary osteoarthritis, unspecified knee: Secondary | ICD-10-CM | POA: Diagnosis not present

## 2012-11-14 DIAGNOSIS — IMO0002 Reserved for concepts with insufficient information to code with codable children: Secondary | ICD-10-CM | POA: Diagnosis not present

## 2012-11-23 DIAGNOSIS — I2510 Atherosclerotic heart disease of native coronary artery without angina pectoris: Secondary | ICD-10-CM | POA: Diagnosis not present

## 2012-11-23 DIAGNOSIS — Z01810 Encounter for preprocedural cardiovascular examination: Secondary | ICD-10-CM | POA: Diagnosis not present

## 2012-12-11 NOTE — H&P (Signed)
[name] is an 67 y.o. female.    Chief Complaint: left knee pain  HPI: Pt is a 67 y.o. female complaining of left knee pain for multiple years. Pain had continually increased since the beginning. X-rays in the clinic show end-stage arthritic changes of the left knee. Pt has tried various conservative treatments which have failed to alleviate their symptoms, including injections and therapy. Various options are discussed with the patient. Risks, benefits and expectations were discussed with the patient. Patient understand the risks, benefits and expectations and wishes to proceed with surgery.   PCP:  Thora Lance, MD  D/C Plans:  Home with HHPT  PMH: Past Medical History  Diagnosis Date  . Coronary stent occlusion   . Arthritis   . Hypertension   . Hyperlipemia   . Heart disease     PSH: Past Surgical History  Procedure Laterality Date  . Rotator cuff repair Left     shoulder  . Cholecystectomy    . Lumbar disc surgery    . Tendon repair Right   . Coronary stent placement      x2    Social History:  reports that she has never smoked. She has never used smokeless tobacco. She reports that she does not drink alcohol or use illicit drugs.  Allergies:  Allergies  Allergen Reactions  . Lipitor [Atorvastatin]     "does not like to take"  . Prednisone Other (See Comments)    Can't sleep    Medications: No current facility-administered medications for this encounter.   Current Outpatient Prescriptions  Medication Sig Dispense Refill  . aspirin EC 81 MG tablet Take 81 mg by mouth every evening.       . cholecalciferol (VITAMIN D) 1000 UNITS tablet Take 1,000 Units by mouth daily.      . clopidogrel (PLAVIX) 75 MG tablet Take 75 mg by mouth every evening.       Marland Kitchen esomeprazole (NEXIUM) 20 MG capsule Take 20 mg by mouth daily before breakfast.      . ezetimibe (ZETIA) 10 MG tablet Take 10 mg by mouth every evening.       . fish oil-omega-3 fatty acids 1000 MG  capsule Take 1 g by mouth 2 (two) times daily.      . Flaxseed, Linseed, (FLAX SEED OIL PO) Take 2 capsules by mouth daily.      . folic acid (FOLVITE) 800 MCG tablet Take 400 mcg by mouth daily.      . isosorbide mononitrate (IMDUR) 60 MG 24 hr tablet Take 60 mg by mouth daily.      . metoprolol succinate (TOPROL-XL) 25 MG 24 hr tablet Take 25 mg by mouth daily.      . Multiple Vitamin (MULTIVITAMIN WITH MINERALS) TABS Take 1 tablet by mouth daily.      Marland Kitchen rOPINIRole (REQUIP) 0.5 MG tablet Take 0.5-1 mg by mouth 2 (two) times daily as needed. For restless leg syndrome      . rosuvastatin (CRESTOR) 10 MG tablet Take 10 mg by mouth daily.      . sertraline (ZOLOFT) 50 MG tablet Take 50 mg by mouth daily.      . vitamin E 400 UNIT capsule Take 400 Units by mouth daily.        No results found for this or any previous visit (from the past 48 hour(s)). No results found.  ROS: Pain with rom of the left lower extremity  Physical Exam: Alert and oriented 66  y.o. female in no acute distress Cranial nerves 2-12 intact Cervical spine: full rom with no tenderness, nv intact distally Chest: active breath sounds bilaterally, no wheeze rhonchi or rales Heart: regular rate and rhythm, no murmur Abd: non tender non distended with active bowel sounds Hip is stable with rom  Left knee with moderate crepitus with rom nv intact distally Antalgic gait No rashes   Assessment/Plan Assessment: left knee end stage osteoarthritis  Plan: Patient will undergo a left total knee arthroplasty by Dr. Ranell Patrick at [facility]. Risks benefits and expectations were discussed with the patient. Patient understand risks, benefits and expectations and wishes to proceed.

## 2012-12-12 ENCOUNTER — Encounter (HOSPITAL_COMMUNITY): Payer: Self-pay

## 2012-12-12 ENCOUNTER — Encounter (HOSPITAL_COMMUNITY): Payer: Self-pay | Admitting: Pharmacy Technician

## 2012-12-12 ENCOUNTER — Encounter (HOSPITAL_COMMUNITY)
Admission: RE | Admit: 2012-12-12 | Discharge: 2012-12-12 | Disposition: A | Discharge status: Home / Self Care | Payer: Medicare Other | Source: Ambulatory Visit | Attending: Orthopedic Surgery | Admitting: Orthopedic Surgery

## 2012-12-12 DIAGNOSIS — Z01812 Encounter for preprocedural laboratory examination: Secondary | ICD-10-CM | POA: Insufficient documentation

## 2012-12-12 DIAGNOSIS — Z01818 Encounter for other preprocedural examination: Secondary | ICD-10-CM | POA: Diagnosis not present

## 2012-12-12 HISTORY — DX: Atherosclerotic heart disease of native coronary artery without angina pectoris: I25.10

## 2012-12-12 HISTORY — DX: Depression, unspecified: F32.A

## 2012-12-12 HISTORY — DX: Gastro-esophageal reflux disease without esophagitis: K21.9

## 2012-12-12 HISTORY — DX: Major depressive disorder, single episode, unspecified: F32.9

## 2012-12-12 LAB — BASIC METABOLIC PANEL
BUN: 15 mg/dL (ref 6–23)
CO2: 27 mEq/L (ref 19–32)
Calcium: 10.2 mg/dL (ref 8.4–10.5)
Chloride: 101 mEq/L (ref 96–112)
Creatinine, Ser: 0.7 mg/dL (ref 0.50–1.10)
GFR calc Af Amer: >90 mL/min (ref >90)
GFR calc non Af Amer: 88 mL/min — ABNORMAL LOW (ref >90)
Glucose, Bld: 99 mg/dL (ref 70–99)
Potassium: 4.6 mEq/L (ref 3.5–5.1)
Sodium: 137 mEq/L (ref 135–145)

## 2012-12-12 LAB — CBC
HCT: 41.2 % (ref 36.0–46.0)
Hemoglobin: 13.8 g/dL (ref 12.0–15.0)
MCH: 30.5 pg (ref 26.0–34.0)
MCHC: 33.5 g/dL (ref 30.0–36.0)
MCV: 90.9 fL (ref 78.0–100.0)
Platelets: 195 10*3/uL (ref 150–400)
RBC: 4.53 MIL/uL (ref 3.87–5.11)
RDW: 12.8 % (ref 11.5–15.5)
WBC: 6.9 10*3/uL (ref 4.0–10.5)

## 2012-12-12 LAB — TYPE AND SCREEN
ABO/RH(D): A POS
Antibody Screen: NEGATIVE

## 2012-12-12 LAB — ABO/RH: ABO/RH(D): A POS

## 2012-12-12 LAB — SURGICAL PCR SCREEN
MRSA, PCR: NEGATIVE
Staphylococcus aureus: NEGATIVE

## 2012-12-12 NOTE — Progress Notes (Signed)
ekg 8/14, cardiac clearance in chart

## 2012-12-12 NOTE — Pre-Procedure Instructions (Addendum)
[name]  12/12/2012   Your procedure is scheduled on:  Friday, September 26th.  Report to [facility], Main Entrance/ Entrance "A" at 8:30 AM.  Call this number if you have problems the morning of surgery: [phone]   Remember:   Do not eat food or drink liquids after midnight.   Take these medicines the morning of surgery with A SIP OF WATER: esomeprazole (NEXIUM), isosorbide mononitrate (IMDUR), metoprolol succinate (TOPROL-XL) ,sertraline (ZOLOFT).          STOP aspirin, fish oil, vit e, flaxseed 12/16/12       plavix per dr                  Drucilla Schmidt not wear jewelry, make-up or nail polish.  Do not wear lotions, powders, or perfumes. You may wear deodorant.  Do not shave 48 hours prior to surgery. Men may shave face and neck.  Do not bring valuables to the hospital.  [facility] is not responsible for any belongings or valuables.  Contacts, dentures or bridgework may not be worn into surgery.  Leave suitcase in the car. After surgery it may be brought to your room.  For patients admitted to the hospital, checkout time is 11:00 AM the day of discharge.     Special Instructions: Shower using CHG 2 nights before surgery and the night before surgery.  If you shower the day of surgery use CHG.  Use special wash - you have one bottle of CHG for all showers.  You should use approximately 1/3 of the bottle for each shower.   Please read over the following fact sheets that you were given: Pain Booklet, Coughing and Deep Breathing, Blood Transfusion Information and Surgical Site Infection Prevention

## 2012-12-13 NOTE — Progress Notes (Signed)
Anesthesia Chart Review:  Patient is scheduled for left TKA on 12/21/12 by Dr. Ranell Patrick.  History includes CAD s/p DES 02/22/05 LAD and DES RCA 01/31/05, non-smoker, HTN, HLD, obesity, GERD, HLD, depression, arthritis. PCP is Dr. Manus Gunning.  Cardiologist is Dr. Armanda Magic who cleared her from a cardiac standpoint.  Nuclear stress test on 11/23/12 showed normal myocardial perfusion, EF 72%, no regional wall motion abnormalities.  EKG at that time showed SB @ 53 bpm.  Echo on 02/28/12 showed mild LVH, LVEF 69%, trivial TR, trace PR, Doppler findings suggestive of grade 1 diastolic dysfunction without elevated LA pressure.  Holter monitor in 02/2012 showed SB/SR.  Her last cardiac cath was on 12/16/05 (see Notes tab).  Carotid duplex on 02/10/12 showed 20-30% RICA stenosis, < 20% LICA stenosis.  CXR on 02/02/12 showed no acute abnormality.  Preoperative labs noted.  She will need a PT/PTT on the day of surgery.  If no acute changes then I anticipate that she can proceed as planned.  Velna Ochs [facility] Short Stay Center/Anesthesiology Phone [phone] 12/13/2012 6:26 PM

## 2012-12-20 MED ORDER — CEFAZOLIN SODIUM-DEXTROSE 2-3 GM-% IV SOLR
2.0000 g | INTRAVENOUS | Status: AC
  Administered 2012-12-21: 2 g via INTRAVENOUS
  Filled 2012-12-20: qty 50

## 2012-12-20 MED ORDER — CHLORHEXIDINE GLUCONATE 4 % EX LIQD: 60.0000 mL | Freq: Once | CUTANEOUS | Status: DC

## 2012-12-21 ENCOUNTER — Inpatient Hospital Stay (HOSPITAL_COMMUNITY): Payer: Medicare Other

## 2012-12-21 ENCOUNTER — Encounter (HOSPITAL_COMMUNITY): Payer: Self-pay | Admitting: *Deleted

## 2012-12-21 ENCOUNTER — Inpatient Hospital Stay (HOSPITAL_COMMUNITY): Payer: Medicare Other | Admitting: Anesthesiology

## 2012-12-21 ENCOUNTER — Encounter (HOSPITAL_COMMUNITY)
Admission: RE | Disposition: A | Discharge status: Skilled Nursing Facility | Payer: Self-pay | Source: Ambulatory Visit | Attending: Orthopedic Surgery

## 2012-12-21 ENCOUNTER — Inpatient Hospital Stay (HOSPITAL_COMMUNITY)
Admission: RE | Admit: 2012-12-21 | Discharge: 2012-12-24 | DRG: 470 | Disposition: A | Discharge status: Skilled Nursing Facility | Payer: Medicare Other | Source: Ambulatory Visit | Attending: Orthopedic Surgery | Admitting: Orthopedic Surgery

## 2012-12-21 ENCOUNTER — Encounter (HOSPITAL_COMMUNITY): Payer: Self-pay | Admitting: Vascular Surgery

## 2012-12-21 DIAGNOSIS — I10 Essential (primary) hypertension: Secondary | ICD-10-CM | POA: Diagnosis present

## 2012-12-21 DIAGNOSIS — K219 Gastro-esophageal reflux disease without esophagitis: Secondary | ICD-10-CM | POA: Diagnosis not present

## 2012-12-21 DIAGNOSIS — F3289 Other specified depressive episodes: Secondary | ICD-10-CM | POA: Diagnosis present

## 2012-12-21 DIAGNOSIS — Z9861 Coronary angioplasty status: Secondary | ICD-10-CM

## 2012-12-21 DIAGNOSIS — Z79899 Other long term (current) drug therapy: Secondary | ICD-10-CM | POA: Diagnosis not present

## 2012-12-21 DIAGNOSIS — E785 Hyperlipidemia, unspecified: Secondary | ICD-10-CM | POA: Diagnosis present

## 2012-12-21 DIAGNOSIS — Z471 Aftercare following joint replacement surgery: Secondary | ICD-10-CM | POA: Diagnosis not present

## 2012-12-21 DIAGNOSIS — I2510 Atherosclerotic heart disease of native coronary artery without angina pectoris: Secondary | ICD-10-CM | POA: Diagnosis present

## 2012-12-21 DIAGNOSIS — Z96659 Presence of unspecified artificial knee joint: Secondary | ICD-10-CM | POA: Diagnosis not present

## 2012-12-21 DIAGNOSIS — IMO0002 Reserved for concepts with insufficient information to code with codable children: Secondary | ICD-10-CM | POA: Diagnosis not present

## 2012-12-21 DIAGNOSIS — M25569 Pain in unspecified knee: Secondary | ICD-10-CM | POA: Diagnosis not present

## 2012-12-21 DIAGNOSIS — F329 Major depressive disorder, single episode, unspecified: Secondary | ICD-10-CM | POA: Diagnosis present

## 2012-12-21 DIAGNOSIS — M1712 Unilateral primary osteoarthritis, left knee: Secondary | ICD-10-CM

## 2012-12-21 DIAGNOSIS — Z7982 Long term (current) use of aspirin: Secondary | ICD-10-CM | POA: Diagnosis not present

## 2012-12-21 DIAGNOSIS — G8918 Other acute postprocedural pain: Secondary | ICD-10-CM | POA: Diagnosis not present

## 2012-12-21 DIAGNOSIS — M1990 Unspecified osteoarthritis, unspecified site: Secondary | ICD-10-CM | POA: Diagnosis not present

## 2012-12-21 DIAGNOSIS — M1710 Unilateral primary osteoarthritis, unspecified knee: Principal | ICD-10-CM | POA: Diagnosis present

## 2012-12-21 DIAGNOSIS — I119 Hypertensive heart disease without heart failure: Secondary | ICD-10-CM | POA: Diagnosis not present

## 2012-12-21 HISTORY — PX: TOTAL KNEE ARTHROPLASTY: SHX125

## 2012-12-21 LAB — PROTIME-INR
INR: 1.01 (ref 0.00–1.49)
Prothrombin Time: 13.1 seconds (ref 11.6–15.2)

## 2012-12-21 LAB — APTT: aPTT: 30 seconds (ref 24–37)

## 2012-12-21 SURGERY — ARTHROPLASTY, KNEE, TOTAL
Anesthesia: Regional | Site: Knee | Laterality: Left | Wound class: Clean

## 2012-12-21 MED ORDER — OXYCODONE HCL 5 MG/5ML PO SOLN: 5.0000 mg | Freq: Once | ORAL | Status: AC | PRN

## 2012-12-21 MED ORDER — HYDROMORPHONE HCL PF 1 MG/ML IJ SOLN
0.2500 mg | INTRAMUSCULAR | Status: DC | PRN
  Administered 2012-12-21 (×2): 0.5 mg via INTRAVENOUS

## 2012-12-21 MED ORDER — MORPHINE SULFATE 2 MG/ML IJ SOLN
2.0000 mg | INTRAMUSCULAR | Status: DC | PRN
  Administered 2012-12-21 – 2012-12-22 (×2): 2 mg via INTRAVENOUS
  Filled 2012-12-21 (×2): qty 1

## 2012-12-21 MED ORDER — WARFARIN - PHARMACIST DOSING INPATIENT
Freq: Every day | Status: DC
  Administered 2012-12-23: 18:00:00

## 2012-12-21 MED ORDER — ISOSORBIDE MONONITRATE ER 60 MG PO TB24
60.0000 mg | ORAL_TABLET | Freq: Every day | ORAL | Status: DC
  Administered 2012-12-22 – 2012-12-24 (×3): 60 mg via ORAL
  Filled 2012-12-21 (×3): qty 1

## 2012-12-21 MED ORDER — METOCLOPRAMIDE HCL 10 MG PO TABS
5.0000 mg | ORAL_TABLET | Freq: Three times a day (TID) | ORAL | Status: DC | PRN
  Administered 2012-12-23: 10 mg via ORAL
  Filled 2012-12-21: qty 1

## 2012-12-21 MED ORDER — METHOCARBAMOL 100 MG/ML IJ SOLN
500.0000 mg | Freq: Four times a day (QID) | INTRAVENOUS | Status: DC | PRN
  Filled 2012-12-21: qty 5

## 2012-12-21 MED ORDER — ROPINIROLE HCL 1 MG PO TABS
1.0000 mg | ORAL_TABLET | Freq: Two times a day (BID) | ORAL | Status: DC | PRN
  Administered 2012-12-23: 2 mg via ORAL
  Filled 2012-12-21: qty 2

## 2012-12-21 MED ORDER — DEXAMETHASONE SODIUM PHOSPHATE 4 MG/ML IJ SOLN
INTRAMUSCULAR | Status: DC | PRN
  Administered 2012-12-21: 4 mg

## 2012-12-21 MED ORDER — HYDROMORPHONE HCL PF 1 MG/ML IJ SOLN
INTRAMUSCULAR | Status: AC
  Administered 2012-12-21: 0.5 mg via INTRAVENOUS
  Filled 2012-12-21: qty 1

## 2012-12-21 MED ORDER — METHOCARBAMOL 500 MG PO TABS
500.0000 mg | ORAL_TABLET | Freq: Four times a day (QID) | ORAL | Status: DC | PRN
  Administered 2012-12-21 – 2012-12-24 (×8): 500 mg via ORAL
  Filled 2012-12-21 (×8): qty 1

## 2012-12-21 MED ORDER — LACTATED RINGERS IV SOLN
INTRAVENOUS | Status: DC
  Administered 2012-12-21: 09:00:00 via INTRAVENOUS

## 2012-12-21 MED ORDER — WARFARIN SODIUM 5 MG PO TABS: 5.0000 mg | ORAL_TABLET | Freq: Every day | ORAL | Status: DC

## 2012-12-21 MED ORDER — PHENYLEPHRINE HCL 10 MG/ML IJ SOLN
INTRAMUSCULAR | Status: DC | PRN
  Administered 2012-12-21 (×2): 80 ug via INTRAVENOUS
  Administered 2012-12-21: 40 ug via INTRAVENOUS
  Administered 2012-12-21: 80 ug via INTRAVENOUS

## 2012-12-21 MED ORDER — PANTOPRAZOLE SODIUM 40 MG PO TBEC
80.0000 mg | DELAYED_RELEASE_TABLET | Freq: Every day | ORAL | Status: DC
  Administered 2012-12-22 – 2012-12-24 (×3): 80 mg via ORAL
  Filled 2012-12-21 (×2): qty 2

## 2012-12-21 MED ORDER — OXYCODONE HCL 5 MG PO TABS
5.0000 mg | ORAL_TABLET | ORAL | Status: DC | PRN
  Administered 2012-12-21: 10 mg via ORAL
  Administered 2012-12-21: 5 mg via ORAL
  Administered 2012-12-22 – 2012-12-24 (×10): 10 mg via ORAL
  Filled 2012-12-21 (×8): qty 2
  Filled 2012-12-21: qty 1
  Filled 2012-12-21 (×3): qty 2

## 2012-12-21 MED ORDER — BISACODYL 10 MG RE SUPP: 10.0000 mg | Freq: Every day | RECTAL | Status: DC | PRN

## 2012-12-21 MED ORDER — MENTHOL 3 MG MT LOZG: 1.0000 | LOZENGE | OROMUCOSAL | Status: DC | PRN

## 2012-12-21 MED ORDER — METHOCARBAMOL 500 MG PO TABS: 500.0000 mg | ORAL_TABLET | Freq: Three times a day (TID) | ORAL | Status: DC | PRN

## 2012-12-21 MED ORDER — ACETAMINOPHEN 650 MG RE SUPP: 650.0000 mg | Freq: Four times a day (QID) | RECTAL | Status: DC | PRN

## 2012-12-21 MED ORDER — MIDAZOLAM HCL 2 MG/2ML IJ SOLN
INTRAMUSCULAR | Status: AC
  Administered 2012-12-21: 2 mg
  Filled 2012-12-21: qty 2

## 2012-12-21 MED ORDER — WARFARIN SODIUM 5 MG PO TABS
5.0000 mg | ORAL_TABLET | Freq: Once | ORAL | Status: AC
  Administered 2012-12-21: 5 mg via ORAL
  Filled 2012-12-21: qty 1

## 2012-12-21 MED ORDER — METOCLOPRAMIDE HCL 5 MG/ML IJ SOLN: 5.0000 mg | Freq: Three times a day (TID) | INTRAMUSCULAR | Status: DC | PRN

## 2012-12-21 MED ORDER — ARTIFICIAL TEARS OP OINT
TOPICAL_OINTMENT | OPHTHALMIC | Status: DC | PRN
  Administered 2012-12-21: 1 via OPHTHALMIC

## 2012-12-21 MED ORDER — LACTATED RINGERS IV SOLN
INTRAVENOUS | Status: DC | PRN
  Administered 2012-12-21 (×2): via INTRAVENOUS

## 2012-12-21 MED ORDER — SODIUM CHLORIDE 0.9 % IV SOLN
INTRAVENOUS | Status: DC
  Administered 2012-12-22: 12:00:00 via INTRAVENOUS

## 2012-12-21 MED ORDER — COUMADIN BOOK
1.0000 | Freq: Once | Status: AC
  Administered 2012-12-21: 1
  Filled 2012-12-21: qty 1

## 2012-12-21 MED ORDER — OXYCODONE HCL 5 MG PO TABS
ORAL_TABLET | ORAL | Status: AC
  Administered 2012-12-21: 5 mg via ORAL
  Filled 2012-12-21: qty 1

## 2012-12-21 MED ORDER — FENTANYL CITRATE 0.05 MG/ML IJ SOLN
INTRAMUSCULAR | Status: DC | PRN
  Administered 2012-12-21 (×5): 50 ug via INTRAVENOUS

## 2012-12-21 MED ORDER — FENTANYL CITRATE 0.05 MG/ML IJ SOLN
INTRAMUSCULAR | Status: AC
  Administered 2012-12-21: 75 ug
  Filled 2012-12-21: qty 2

## 2012-12-21 MED ORDER — ONDANSETRON HCL 4 MG PO TABS: 4.0000 mg | ORAL_TABLET | Freq: Four times a day (QID) | ORAL | Status: DC | PRN

## 2012-12-21 MED ORDER — ALBUMIN HUMAN 5 % IV SOLN
INTRAVENOUS | Status: DC | PRN
  Administered 2012-12-21: 11:00:00 via INTRAVENOUS

## 2012-12-21 MED ORDER — PHENOL 1.4 % MT LIQD: 1.0000 | OROMUCOSAL | Status: DC | PRN

## 2012-12-21 MED ORDER — OXYCODONE-ACETAMINOPHEN 5-325 MG PO TABS: 1.0000 | ORAL_TABLET | ORAL | Status: DC | PRN

## 2012-12-21 MED ORDER — BUPIVACAINE-EPINEPHRINE PF 0.5-1:200000 % IJ SOLN
INTRAMUSCULAR | Status: DC | PRN
  Administered 2012-12-21: 150 mg

## 2012-12-21 MED ORDER — WARFARIN VIDEO: 1.0000 | Freq: Once | Status: DC

## 2012-12-21 MED ORDER — PROMETHAZINE HCL 25 MG/ML IJ SOLN: 6.2500 mg | INTRAMUSCULAR | Status: DC | PRN

## 2012-12-21 MED ORDER — OXYCODONE HCL 5 MG PO TABS
5.0000 mg | ORAL_TABLET | Freq: Once | ORAL | Status: AC | PRN
  Administered 2012-12-21: 5 mg via ORAL

## 2012-12-21 MED ORDER — SERTRALINE HCL 50 MG PO TABS
50.0000 mg | ORAL_TABLET | Freq: Every day | ORAL | Status: DC
Start: 2012-12-22 — End: 2012-12-24
  Administered 2012-12-22 – 2012-12-23 (×2): 50 mg via ORAL
  Filled 2012-12-21 (×3): qty 1

## 2012-12-21 MED ORDER — CEFAZOLIN SODIUM-DEXTROSE 2-3 GM-% IV SOLR
2.0000 g | Freq: Four times a day (QID) | INTRAVENOUS | Status: AC
  Administered 2012-12-21 (×2): 2 g via INTRAVENOUS
  Filled 2012-12-21 (×2): qty 50

## 2012-12-21 MED ORDER — ATORVASTATIN CALCIUM 20 MG PO TABS
20.0000 mg | ORAL_TABLET | Freq: Every day | ORAL | Status: DC
  Administered 2012-12-22 – 2012-12-23 (×2): 20 mg via ORAL
  Filled 2012-12-21 (×4): qty 1

## 2012-12-21 MED ORDER — ADULT MULTIVITAMIN W/MINERALS CH
1.0000 | ORAL_TABLET | Freq: Every day | ORAL | Status: DC
  Administered 2012-12-21 – 2012-12-24 (×4): 1 via ORAL
  Filled 2012-12-21 (×4): qty 1

## 2012-12-21 MED ORDER — ASPIRIN EC 81 MG PO TBEC
81.0000 mg | DELAYED_RELEASE_TABLET | Freq: Every evening | ORAL | Status: DC
  Administered 2012-12-22 – 2012-12-23 (×2): 81 mg via ORAL
  Filled 2012-12-21 (×4): qty 1

## 2012-12-21 MED ORDER — ONDANSETRON HCL 4 MG/2ML IJ SOLN
INTRAMUSCULAR | Status: DC | PRN
  Administered 2012-12-21: 4 mg via INTRAVENOUS

## 2012-12-21 MED ORDER — ACETAMINOPHEN 325 MG PO TABS
650.0000 mg | ORAL_TABLET | Freq: Four times a day (QID) | ORAL | Status: DC | PRN
  Administered 2012-12-24: 650 mg via ORAL
  Filled 2012-12-21: qty 2

## 2012-12-21 MED ORDER — EZETIMIBE 10 MG PO TABS
10.0000 mg | ORAL_TABLET | Freq: Every evening | ORAL | Status: DC
  Administered 2012-12-21 – 2012-12-23 (×3): 10 mg via ORAL
  Filled 2012-12-21 (×4): qty 1

## 2012-12-21 MED ORDER — GLYCOPYRROLATE 0.2 MG/ML IJ SOLN
INTRAMUSCULAR | Status: DC | PRN
  Administered 2012-12-21: 0.2 mg via INTRAVENOUS

## 2012-12-21 MED ORDER — ONDANSETRON HCL 4 MG/2ML IJ SOLN
4.0000 mg | Freq: Four times a day (QID) | INTRAMUSCULAR | Status: DC | PRN
  Administered 2012-12-23: 4 mg via INTRAVENOUS
  Filled 2012-12-21: qty 2

## 2012-12-21 MED ORDER — VITAMIN D3 25 MCG (1000 UNIT) PO TABS
1000.0000 [IU] | ORAL_TABLET | Freq: Every day | ORAL | Status: DC
  Administered 2012-12-21 – 2012-12-24 (×4): 1000 [IU] via ORAL
  Filled 2012-12-21 (×4): qty 1

## 2012-12-21 MED ORDER — PROPOFOL 10 MG/ML IV BOLUS
INTRAVENOUS | Status: DC | PRN
  Administered 2012-12-21: 200 mg via INTRAVENOUS

## 2012-12-21 MED ORDER — SODIUM CHLORIDE 0.9 % IR SOLN
Status: DC | PRN
  Administered 2012-12-21: 3000 mL

## 2012-12-21 MED ORDER — METOPROLOL SUCCINATE ER 25 MG PO TB24
25.0000 mg | ORAL_TABLET | Freq: Every day | ORAL | Status: DC
  Administered 2012-12-22 – 2012-12-24 (×3): 25 mg via ORAL
  Filled 2012-12-21 (×3): qty 1

## 2012-12-21 MED ORDER — CLOPIDOGREL BISULFATE 75 MG PO TABS
75.0000 mg | ORAL_TABLET | Freq: Every evening | ORAL | Status: DC
  Administered 2012-12-22 – 2012-12-23 (×2): 75 mg via ORAL
  Filled 2012-12-21 (×4): qty 1

## 2012-12-21 SURGICAL SUPPLY — 59 items
BANDAGE ELASTIC 4 VELCRO ST LF (GAUZE/BANDAGES/DRESSINGS) ×2 IMPLANT
BANDAGE ESMARK 6X9 LF (GAUZE/BANDAGES/DRESSINGS) ×1 IMPLANT
BANDAGE GAUZE ELAST BULKY 4 IN (GAUZE/BANDAGES/DRESSINGS) ×4 IMPLANT
BLADE SAG 18X100X1.27 (BLADE) ×2 IMPLANT
BLADE SAW SGTL 13.0X1.19X90.0M (BLADE) ×2 IMPLANT
BNDG CMPR 9X6 STRL LF SNTH (GAUZE/BANDAGES/DRESSINGS) ×1
BNDG CMPR MED 10X6 ELC LF (GAUZE/BANDAGES/DRESSINGS) ×1
BNDG ELASTIC 6X10 VLCR STRL LF (GAUZE/BANDAGES/DRESSINGS) ×2 IMPLANT
BNDG ESMARK 6X9 LF (GAUZE/BANDAGES/DRESSINGS) ×2
BOWL SMART MIX CTS (DISPOSABLE) ×2 IMPLANT
CAPT RP KNEE ×1 IMPLANT
CEMENT HV SMART SET (Cement) ×4 IMPLANT
CLOTH BEACON ORANGE TIMEOUT ST (SAFETY) ×2 IMPLANT
COVER SURGICAL LIGHT HANDLE (MISCELLANEOUS) ×2 IMPLANT
CUFF TOURNIQUET SINGLE 34IN LL (TOURNIQUET CUFF) IMPLANT
CUFF TOURNIQUET SINGLE 44IN (TOURNIQUET CUFF) IMPLANT
DRAPE EXTREMITY T 121X128X90 (DRAPE) ×2 IMPLANT
DRAPE INCISE IOBAN 66X45 STRL (DRAPES) ×1 IMPLANT
DRAPE PROXIMA HALF (DRAPES) ×2 IMPLANT
DRAPE U-SHAPE 47X51 STRL (DRAPES) ×2 IMPLANT
DRSG ADAPTIC 3X8 NADH LF (GAUZE/BANDAGES/DRESSINGS) ×2 IMPLANT
DRSG PAD ABDOMINAL 8X10 ST (GAUZE/BANDAGES/DRESSINGS) ×4 IMPLANT
DURAPREP 26ML APPLICATOR (WOUND CARE) ×2 IMPLANT
ELECT CAUTERY BLADE 6.4 (BLADE) ×2 IMPLANT
ELECT REM PT RETURN 9FT ADLT (ELECTROSURGICAL) ×2
ELECTRODE REM PT RTRN 9FT ADLT (ELECTROSURGICAL) ×1 IMPLANT
FACESHIELD LNG OPTICON STERILE (SAFETY) ×6 IMPLANT
GLOVE BIOGEL PI ORTHO PRO 7.5 (GLOVE) ×1
GLOVE BIOGEL PI ORTHO PRO SZ8 (GLOVE) ×1
GLOVE ORTHO TXT STRL SZ7.5 (GLOVE) ×2 IMPLANT
GLOVE PI ORTHO PRO STRL 7.5 (GLOVE) ×1 IMPLANT
GLOVE PI ORTHO PRO STRL SZ8 (GLOVE) ×1 IMPLANT
GLOVE SURG ORTHO 8.5 STRL (GLOVE) ×2 IMPLANT
GOWN STRL NON-REIN LRG LVL3 (GOWN DISPOSABLE) ×2 IMPLANT
GOWN STRL REIN XL XLG (GOWN DISPOSABLE) ×4 IMPLANT
HANDPIECE INTERPULSE COAX TIP (DISPOSABLE) ×2
IMMOBILIZER KNEE 22 UNIV (SOFTGOODS) IMPLANT
KIT BASIN OR (CUSTOM PROCEDURE TRAY) ×2 IMPLANT
KIT MANIFOLD (MISCELLANEOUS) ×2 IMPLANT
KIT ROOM TURNOVER OR (KITS) ×2 IMPLANT
MANIFOLD NEPTUNE II (INSTRUMENTS) ×2 IMPLANT
NS IRRIG 1000ML POUR BTL (IV SOLUTION) ×2 IMPLANT
PACK TOTAL JOINT (CUSTOM PROCEDURE TRAY) ×2 IMPLANT
PAD ARMBOARD 7.5X6 YLW CONV (MISCELLANEOUS) ×4 IMPLANT
SET HNDPC FAN SPRY TIP SCT (DISPOSABLE) ×1 IMPLANT
SPONGE GAUZE 4X4 12PLY (GAUZE/BANDAGES/DRESSINGS) ×2 IMPLANT
STRIP CLOSURE SKIN 1/2X4 (GAUZE/BANDAGES/DRESSINGS) ×4 IMPLANT
SUCTION FRAZIER TIP 10 FR DISP (SUCTIONS) ×2 IMPLANT
SUT MNCRL AB 3-0 PS2 18 (SUTURE) ×2 IMPLANT
SUT VIC AB 0 CT1 27 (SUTURE) ×4
SUT VIC AB 0 CT1 27XBRD ANBCTR (SUTURE) ×2 IMPLANT
SUT VIC AB 1 CT1 27 (SUTURE) ×4
SUT VIC AB 1 CT1 27XBRD ANBCTR (SUTURE) ×2 IMPLANT
SUT VIC AB 2-0 CT1 27 (SUTURE) ×4
SUT VIC AB 2-0 CT1 TAPERPNT 27 (SUTURE) ×2 IMPLANT
TOWEL OR 17X24 6PK STRL BLUE (TOWEL DISPOSABLE) ×2 IMPLANT
TOWEL OR 17X26 10 PK STRL BLUE (TOWEL DISPOSABLE) ×2 IMPLANT
TRAY FOLEY CATH 16FRSI W/METER (SET/KITS/TRAYS/PACK) IMPLANT
WATER STERILE IRR 1000ML POUR (IV SOLUTION) ×4 IMPLANT

## 2012-12-21 NOTE — Evaluation (Signed)
Physical Therapy Evaluation Patient Details Name: SOMALIA SEGLER MRN: [id number] DOB: 1945/12/13 Today's Date: 12/21/2012 Time: 1478-2956 PT Time Calculation (min): 23 min  PT Assessment / Plan / Recommendation History of Present Illness  Patient is a 67 yo female s/p Lt TKA  Clinical Impression  Patient presents with problems listed below.  Will benefit from acute PT to maximize independence prior to discharge.  Patient to discharge to SNF for continued therapy prior to returning home.    PT Assessment  Patient needs continued PT services    Follow Up Recommendations  SNF    Does the patient have the potential to tolerate intense rehabilitation      Barriers to Discharge Decreased caregiver support Patient lives alone    Equipment Recommendations  3in1 (PT)    Recommendations for Other Services     Frequency 7X/week    Precautions / Restrictions Precautions Precautions: Knee Precaution Booklet Issued: Yes (comment) Precaution Comments: Reviewed precautions with patient Required Braces or Orthoses: Knee Immobilizer - Left Knee Immobilizer - Left: On except when in CPM Restrictions Weight Bearing Restrictions: Yes LLE Weight Bearing: Weight bearing as tolerated   Pertinent Vitals/Pain Pain limiting mobility (8/10 with mobility)      Mobility  Bed Mobility Bed Mobility: Supine to Sit;Sitting - Scoot to Edge of Bed;Sit to Supine Supine to Sit: 4: Min assist;HOB elevated;With rails Sitting - Scoot to Edge of Bed: 4: Min guard;With rail Sit to Supine: 4: Min assist;HOB elevated Scooting to HOB: 4: Min assist;With trapeze Details for Bed Mobility Assistance: Patient with KI on LLE.  Verbal cues for technique for mobility.  Assist to move LLE off of and onto bed.  Patient sat EOB x 8 minutes with good balance.  Pain increasing.  Returned to supine. Transfers Transfers: Not assessed (Patient declined)    Exercises Total Joint Exercises Ankle Circles/Pumps:  AROM;Both;10 reps;Supine   PT Diagnosis: Difficulty walking;Acute pain  PT Problem List: Decreased strength;Decreased range of motion;Decreased activity tolerance;Decreased balance;Decreased mobility;Decreased knowledge of use of DME;Decreased knowledge of precautions;Pain PT Treatment Interventions: DME instruction;Gait training;Functional mobility training;Therapeutic exercise;Patient/family education     PT Goals(Current goals can be found in the care plan section) Acute Rehab PT Goals Patient Stated Goal: To get stronger.  To eventually go home PT Goal Formulation: With patient Time For Goal Achievement: 01/04/13 Potential to Achieve Goals: Good  Visit Information  Last PT Received On: 12/21/12 Assistance Needed: +1 History of Present Illness: Patient is a 67 yo female s/p Lt TKA       Prior Functioning  Home Living Family/patient expects to be discharged to:: Skilled nursing facility Living Arrangements: Alone Home Equipment: Walker - 2 wheels;Cane - single point Additional Comments: Patient reports she is going to [facility] at discharge Prior Function Level of Independence: Independent Communication Communication: No difficulties    Cognition  Cognition Arousal/Alertness: Awake/alert Behavior During Therapy: WFL for tasks assessed/performed Overall Cognitive Status: Within Functional Limits for tasks assessed    Extremity/Trunk Assessment Upper Extremity Assessment Upper Extremity Assessment: Overall WFL for tasks assessed Lower Extremity Assessment Lower Extremity Assessment: LLE deficits/detail LLE Deficits / Details: Decreased strength and ROM from surgery/pain. LLE: Unable to fully assess due to pain;Unable to fully assess due to immobilization   Balance Balance Balance Assessed: Yes Static Sitting Balance Static Sitting - Balance Support: No upper extremity supported;Feet supported Static Sitting - Level of Assistance: 5: Stand by  assistance Static Sitting - Comment/# of Minutes: 8  End of Session  PT - End of Session Equipment Utilized During Treatment: Left knee immobilizer;Oxygen Activity Tolerance: Patient limited by pain;Patient limited by fatigue Patient left: in bed;with call bell/phone within reach;with family/visitor present Nurse Communication: Mobility status CPM Left Knee CPM Left Knee: Off  GP     Vena Austria 12/21/2012, 6:49 PM Durenda Hurt. Renaldo Fiddler, [facility] Acute Rehab Services Pager [phone]

## 2012-12-21 NOTE — Progress Notes (Signed)
Orthopedic Tech Progress Note Patient Details:  [name] 1945-04-27 [id number]  CPM Left Knee CPM Left Knee: On Left Knee Flexion (Degrees): 60 Left Knee Extension (Degrees): 0 Additional Comments: Trapeze bar   Shawnie Pons 12/21/2012, 1:54 PM

## 2012-12-21 NOTE — Consult Note (Signed)
PHARMACY CONSULT NOTE  Pharmacy Consult:  Coumadin Indication: VTE prophylaxis s/p L-TKA  Allergies: Allergies  Allergen Reactions  . Lipitor [Atorvastatin]     "does not like to take"  . Prednisone Other (See Comments)    Can't sleep    Height/Weight: Height: 5' 4.17" (163 cm) Weight: 215 lb 9.8 oz (97.8 kg) IBW/kg (Calculated) : 55.1 Dosing weight 97.8 kg  Vital Signs: BP 138/78  Pulse 56  Temp(Src) 97.7 F (36.5 C)  Resp 12  Ht 5' 4.17" (1.63 m)  Wt 215 lb 9.8 oz (97.8 kg)  BMI 36.81 kg/m2  SpO2 97%    Recent Labs  12/21/12 0849  APTT 30  LABPROT 13.1  INR 1.01   Lab Results  Component Value Date   INR 1.01 12/21/2012   INR 1.0 02/20/2007   Estimated Creatinine Clearance: 77.8 ml/min (by C-G formula based on Cr of 0.7).  Medical / Surgical History: Past Medical History  Diagnosis Date  . Coronary stent occlusion   . Arthritis   . Hypertension   . Hyperlipemia   . Heart disease   . Coronary artery disease     stents  06  . Depression   . GERD (gastroesophageal reflux disease)    Past Surgical History  Procedure Laterality Date  . Rotator cuff repair Left     shoulder  . Cholecystectomy    . Lumbar disc surgery    . Tendon repair Right     ankle  . Coronary stent placement      x2  . Cardiac catheterization      Medications:  Prescriptions prior to admission  Medication Sig Dispense Refill  . aspirin EC 81 MG tablet Take 81 mg by mouth every evening.       . Calcium-Magnesium-Vitamin D (CALCIUM 1200+D3 PO) Take 1 capsule by mouth daily.      . cholecalciferol (VITAMIN D) 1000 UNITS tablet Take 1,000 Units by mouth daily.      . clopidogrel (PLAVIX) 75 MG tablet Take 75 mg by mouth every evening.       Marland Kitchen esomeprazole (NEXIUM) 20 MG capsule Take 20 mg by mouth daily before breakfast.      . ezetimibe (ZETIA) 10 MG tablet Take 10 mg by mouth every evening.       . Flaxseed, Linseed, (FLAX SEED OIL PO) Take 2,400 mg by mouth daily.        . isosorbide mononitrate (IMDUR) 60 MG 24 hr tablet Take 60 mg by mouth daily.      . metoprolol succinate (TOPROL-XL) 25 MG 24 hr tablet Take 25 mg by mouth daily.      . Multiple Vitamin (MULTIVITAMIN WITH MINERALS) TABS Take 1 tablet by mouth daily.      Marland Kitchen OVER THE COUNTER MEDICATION Take 1 capsule by mouth 2 (two) times daily. Omega XL  (Fish Oil Supplement)      . rOPINIRole (REQUIP) 0.5 MG tablet Take 1-2 mg by mouth 2 (two) times daily as needed. For restless leg syndrome      . rosuvastatin (CRESTOR) 10 MG tablet Take 10 mg by mouth daily.      . sertraline (ZOLOFT) 50 MG tablet Take 50 mg by mouth daily.       Scheduled:  . aspirin EC  81 mg Oral QPM  . atorvastatin  20 mg Oral q1800  .  ceFAZolin (ANCEF) IV  2 g Intravenous Q6H  . cholecalciferol  1,000 Units Oral Daily  .  clopidogrel  75 mg Oral QPM  . ezetimibe  10 mg Oral QPM  . [START ON 12/22/2012] isosorbide mononitrate  60 mg Oral Daily  . [START ON 12/22/2012] metoprolol succinate  25 mg Oral Daily  . multivitamin with minerals  1 tablet Oral Daily  . [START ON 12/22/2012] pantoprazole  80 mg Oral Q1200  . [START ON 12/22/2012] sertraline  50 mg Oral Daily   Anti-infectives   Start     Dose/Rate Route Frequency Ordered Stop   12/21/12 1600  ceFAZolin (ANCEF) IVPB 2 g/50 mL premix     2 g 100 mL/hr over 30 Minutes Intravenous Every 6 hours 12/21/12 1451 12/22/12 0359   12/21/12 0600  ceFAZolin (ANCEF) IVPB 2 g/50 mL premix     2 g 100 mL/hr over 30 Minutes Intravenous On call to O.R. 12/20/12 1520 12/21/12 1018      Assessment:  67 y.o.female s/p L-TKA who will be started on Coumadin post-op for VTE prophylaxis.    Baseline INR 1.01.  Coumadin Predictor Score = 3.  Goal of Therapy:   INR 2-3      Plan:   Coumadin 5 mg po today. Daily INR's, CBC.  Monitor for bleeding complications.   Katura Eatherly, Elisha Headland,  Pharm.D.. 12/21/2012,  3:14 PM

## 2012-12-21 NOTE — Transfer of Care (Signed)
Immediate Anesthesia Transfer of Care Note  Patient: [name]  Procedure(s) Performed: Procedure(s): LEFT TOTAL KNEE ARTHROPLASTY (Left)  Patient Location: PACU  Anesthesia Type:General and Regional  Level of Consciousness: awake, alert , oriented and sedated  Airway & Oxygen Therapy: Patient Spontanous Breathing and Patient connected to nasal cannula oxygen  Post-op Assessment: Report given to PACU RN, Post -op Vital signs reviewed and stable and Patient moving all extremities  Post vital signs: Reviewed and stable  Complications: No apparent anesthesia complications

## 2012-12-21 NOTE — Anesthesia Preprocedure Evaluation (Addendum)
Anesthesia Evaluation  Patient identified by MRN, date of birth, ID band Patient awake    Reviewed: Allergy & Precautions, H&P , NPO status , Patient's Chart, lab work & pertinent test results  History of Anesthesia Complications Negative for: history of anesthetic complications  Airway Mallampati: I TM Distance: >3 FB     Dental  (+) Edentulous Upper, Edentulous Lower and Dental Advisory Given   Pulmonary neg pulmonary ROS,    Pulmonary exam normal       Cardiovascular hypertension, Pt. on medications + CAD     Neuro/Psych PSYCHIATRIC DISORDERS Depression TIA   GI/Hepatic Neg liver ROS, GERD-  ,  Endo/Other  negative endocrine ROS  Renal/GU negative Renal ROS     Musculoskeletal   Abdominal   Peds  Hematology   Anesthesia Other Findings   Reproductive/Obstetrics                          Anesthesia Physical Anesthesia Plan  ASA: II  Anesthesia Plan: General   Post-op Pain Management:    Induction: Intravenous  Airway Management Planned: LMA  Additional Equipment:   Intra-op Plan:   Post-operative Plan: Extubation in OR  Informed Consent: I have reviewed the patients History and Physical, chart, labs and discussed the procedure including the risks, benefits and alternatives for the proposed anesthesia with the patient or authorized representative who has indicated his/her understanding and acceptance.   Dental advisory given  Plan Discussed with: CRNA, Anesthesiologist and Surgeon  Anesthesia Plan Comments:        Anesthesia Quick Evaluation

## 2012-12-21 NOTE — Preoperative (Signed)
Beta Blockers   Reason not to administer Beta Blockers:Not Applicable 

## 2012-12-21 NOTE — Anesthesia Postprocedure Evaluation (Signed)
  Anesthesia Post-op Note  Patient: [name]  Procedure(s) Performed: Procedure(s): LEFT TOTAL KNEE ARTHROPLASTY (Left)  Patient Location: PACU  Anesthesia Type:General  Level of Consciousness: awake, alert , oriented and patient cooperative  Airway and Oxygen Therapy: Patient Spontanous Breathing  Post-op Pain: mild  Post-op Assessment: Post-op Vital signs reviewed, Patient's Cardiovascular Status Stable, Respiratory Function Stable, Patent Airway, No signs of Nausea or vomiting and Pain level controlled  Post-op Vital Signs: stable  Complications: No apparent anesthesia complications

## 2012-12-21 NOTE — Progress Notes (Signed)
Utilization review completed. Tenesha Garza, RN, BSN. 

## 2012-12-21 NOTE — Brief Op Note (Signed)
12/21/2012  12:09 PM  PATIENT:  [name]  67 y.o. female  PRE-OPERATIVE DIAGNOSIS:  LEFT KNEE OA, end stage  POST-OPERATIVE DIAGNOSIS:  left knee osteoarthritis, end stage  PROCEDURE:  Procedure(s): LEFT TOTAL KNEE ARTHROPLASTY (Left) DePuy Sigma RP  SURGEON:  Surgeon(s) and Role:    * Verlee Rossetti, MD - Primary  PHYSICIAN ASSISTANT:   ASSISTANTS: Thea Gist, PA-C   ANESTHESIA:   regional and general  EBL:  Total I/O In: 1250 [I.V.:1000; IV Piggyback:250] Out: 175 [Urine:175]  BLOOD ADMINISTERED:none  DRAINS: none   LOCAL MEDICATIONS USED:  NONE  SPECIMEN:  No Specimen  DISPOSITION OF SPECIMEN:  N/A  COUNTS:  YES  TOURNIQUET:  * Missing tourniquet times found for documented tourniquets in log:  [id number] *  DICTATION: .Other Dictation: Dictation Number [id number]  PLAN OF CARE: Admit to inpatient   PATIENT DISPOSITION:  PACU - hemodynamically stable.   Delay start of Pharmacological VTE agent (>24hrs) due to surgical blood loss or risk of bleeding: no

## 2012-12-21 NOTE — Interval H&P Note (Signed)
History and Physical Interval Note:  12/21/2012 9:42 AM  [name]  has presented today for surgery, with the diagnosis of LEFT KNEE OA  The various methods of treatment have been discussed with the patient and family. After consideration of risks, benefits and other options for treatment, the patient has consented to  Procedure(s): LEFT TOTAL KNEE ARTHROPLASTY (Left) as a surgical intervention .  The patient's history has been reviewed, patient examined, no change in status, stable for surgery.  I have reviewed the patient's chart and labs.  Questions were answered to the patient's satisfaction.     Mertie Haslem,STEVEN R

## 2012-12-21 NOTE — Anesthesia Procedure Notes (Addendum)
Anesthesia Regional Block:  Femoral nerve block  Pre-Anesthetic Checklist: ,, timeout performed, Correct Patient, Correct Site, Correct Laterality, Correct Procedure, Correct Position, site marked, Risks and benefits discussed,  Surgical consent,  Pre-op evaluation,  At surgeon's request and post-op pain management  Laterality: Left  Prep: chloraprep       Needles:  Injection technique: Single-shot  Needle Type: Echogenic Stimulator Needle     Needle Length: 5cm 5 cm Needle Gauge: 22 and 22 G    Additional Needles:  Procedures: ultrasound guided (picture in chart) and nerve stimulator Femoral nerve block  Nerve Stimulator or Paresthesia:  Response: quadraceps contraction, 0.45 mA,   Additional Responses:   Narrative:  Start time: 12/21/2012 10:00 AM End time: 12/21/2012 10:10 AM Injection made incrementally with aspirations every 5 mL.  Performed by: Personally  Anesthesiologist: Halford Decamp, MD  Additional Notes: Functioning IV was confirmed and monitors were applied.  A 50mm 22ga Arrow echogenic stimulator needle was used. Sterile prep and drape,hand hygiene and sterile gloves were used. Ultrasound guidance: relevant anatomy identified, needle position confirmed, local anesthetic spread visualized around nerve(s)., vascular puncture avoided.  Image printed for medical record. Negative aspiration and negative test dose prior to incremental administration of local anesthetic. The patient tolerated the procedure well.    Femoral nerve block

## 2012-12-21 NOTE — Progress Notes (Signed)
PA on call notified of meds due now= Coumadin, Aspirin, and Plavix. Order to hold all except Coumadin now. PA to discuss further with MD.

## 2012-12-22 LAB — BASIC METABOLIC PANEL
BUN: 16 mg/dL (ref 6–23)
CO2: 26 mEq/L (ref 19–32)
Calcium: 9 mg/dL (ref 8.4–10.5)
Chloride: 102 mEq/L (ref 96–112)
Creatinine, Ser: 0.61 mg/dL (ref 0.50–1.10)
GFR calc Af Amer: >90 mL/min (ref >90)
GFR calc non Af Amer: >90 mL/min (ref >90)
Glucose, Bld: 107 mg/dL — ABNORMAL HIGH (ref 70–99)
Potassium: 4.2 mEq/L (ref 3.5–5.1)
Sodium: 138 mEq/L (ref 135–145)

## 2012-12-22 LAB — CBC
HCT: 34.4 % — ABNORMAL LOW (ref 36.0–46.0)
Hemoglobin: 11.6 g/dL — ABNORMAL LOW (ref 12.0–15.0)
MCH: 30.6 pg (ref 26.0–34.0)
MCHC: 33.7 g/dL (ref 30.0–36.0)
MCV: 90.8 fL (ref 78.0–100.0)
Platelets: 173 10*3/uL (ref 150–400)
RBC: 3.79 MIL/uL — ABNORMAL LOW (ref 3.87–5.11)
RDW: 13.2 % (ref 11.5–15.5)
WBC: 11.4 10*3/uL — ABNORMAL HIGH (ref 4.0–10.5)

## 2012-12-22 LAB — PROTIME-INR
INR: 1.13 (ref 0.00–1.49)
Prothrombin Time: 14.3 seconds (ref 11.6–15.2)

## 2012-12-22 MED ORDER — WARFARIN SODIUM 5 MG PO TABS
5.0000 mg | ORAL_TABLET | Freq: Once | ORAL | Status: AC
  Administered 2012-12-22: 5 mg via ORAL
  Filled 2012-12-22: qty 1

## 2012-12-22 NOTE — Progress Notes (Signed)
   Subjective: 1 Day Post-Op Procedure(s) (LRB): LEFT TOTAL KNEE ARTHROPLASTY (Left)  Moderate pain to left knee but overall doing okay Denies any other symptoms or issues currently Patient reports pain as moderate.  Objective:   VITALS:   Filed Vitals:   12/22/12 0545  BP: 140/81  Pulse: 66  Temp: 98.4 F (36.9 C)  Resp: 18    Left leg nv intact distally Dressing intact No rashes or edema  LABS  Recent Labs  12/22/12 0425  HGB 11.6*  HCT 34.4*  WBC 11.4*  PLT 173     Recent Labs  12/22/12 0425  NA 138  K 4.2  BUN 16  CREATININE 0.61  GLUCOSE 107*     Assessment/Plan: 1 Day Post-Op Procedure(s) (LRB): LEFT TOTAL KNEE ARTHROPLASTY (Left)  PT/OT D/c planning dvt prophylaxis Pulmonary toilet Suspected d/c Monday    Alphonsa Overall, MPAS, PA-C  12/22/2012, 11:12 AM

## 2012-12-22 NOTE — Progress Notes (Signed)
OT Cancellation Note  Patient Details Name: [name] MRN: [id number] DOB: 1945/04/22   Cancelled Treatment:    Reason Eval/Treat Not Completed: OT screened, no needs identified, will sign off  Order received, reviewed chart. Pt for transfer to SNF for rehab. No acute OT needs identified. Will sign off. Please re-order if OT needed.         Earlie Raveling OTR/L [phone] 12/22/2012, 8:35 AM

## 2012-12-22 NOTE — Progress Notes (Signed)
Physical Therapy Treatment Patient Details Name: [name] MRN: [id number] DOB: 04-Sep-1945 Today's Date: 12/22/2012 Time: 6813-0865 PT Time Calculation (min): 25 min  PT Assessment / Plan / Recommendation  History of Present Illness Patient is a 67 yo female s/p Lt TKA   PT Comments   Pt slowly progressing with therapy. Pt is a fall risk and demos decr safety awareness when she becomes fatigued. Pt to benefit form STSNF upon acute D/C since she lives alone. Will cont to f/u with pt while in acute setting per POC.   Follow Up Recommendations  SNF     Does the patient have the potential to tolerate intense rehabilitation     Barriers to Discharge        Equipment Recommendations  3in1 (PT)    Recommendations for Other Services    Frequency 7X/week   Progress towards PT Goals Progress towards PT goals: Progressing toward goals  Plan Current plan remains appropriate    Precautions / Restrictions Precautions Precautions: Knee Precaution Comments: no pillow under the knee  Required Braces or Orthoses: Knee Immobilizer - Left Knee Immobilizer - Left: On except when in CPM Restrictions Weight Bearing Restrictions: Yes LLE Weight Bearing: Weight bearing as tolerated   Pertinent Vitals/Pain 5/10; pt premedicated     Mobility  Bed Mobility Bed Mobility: Supine to Sit;Sitting - Scoot to Edge of Bed Supine to Sit: 4: Min assist;HOB elevated;With rails Sitting - Scoot to Edge of Bed: 5: Supervision Details for Bed Mobility Assistance: (A) to bring Lt LE to/off EOB; HOB elevated and handrails required Transfers Transfers: Stand to Sit;Sit to Stand Sit to Stand: 3: Mod assist;From elevated surface;From bed;From chair/3-in-1;With armrests Stand to Sit: 4: Min assist;With upper extremity assist;To chair/3-in-1;With armrests;To elevated surface Details for Transfer Assistance: pt with difficulty achieving upright standing position from lower surface; cues for hand placement and  sequencing (pt flopped in chair due to fatigue ) Ambulation/Gait Ambulation/Gait Assistance: 4: Min assist Ambulation Distance (Feet): 12 Feet (x2) Assistive device: Rolling walker Ambulation/Gait Assistance Details: cues for hand placement and safety with RW; pt requires (A) to manage RW and demo decr safety awareness when attempting to sit after amb due to fatigue  Gait Pattern: Step-to pattern;Decreased stance time - left;Decreased step length - right;Trunk flexed Gait velocity: decreased General Gait Details: cues for upright posture  Stairs: No Wheelchair Mobility Wheelchair Mobility: No    Exercises Total Joint Exercises Ankle Circles/Pumps: AROM;Both;10 reps;Supine Quad Sets: AROM;Left;10 reps Heel Slides: PROM;Left;5 reps (limited by pain) Hip ABduction/ADduction: AAROM;Left;10 reps Goniometric ROM: PROM in sitting grossly 50 degrees limited by pain   PT Diagnosis:    PT Problem List:   PT Treatment Interventions:     PT Goals (current goals can now be found in the care plan section) Acute Rehab PT Goals Patient Stated Goal: to get better  PT Goal Formulation: With patient Time For Goal Achievement: 01/04/13 Potential to Achieve Goals: Good  Visit Information  Last PT Received On: 68/27/14 Assistance Needed: +1 History of Present Illness: Patient is a 67 yo female s/p Lt TKA    Subjective Data  Subjective: pt lying supine; requesting to go to the bathroom.  Patient Stated Goal: to get better    Cognition  Cognition Arousal/Alertness: Awake/alert Behavior During Therapy: WFL for tasks assessed/performed Overall Cognitive Status: Within Functional Limits for tasks assessed    Balance  Balance Balance Assessed: No  End of Session PT - End of Session Equipment Utilized During Treatment: Gait  belt;Left knee immobilizer Activity Tolerance: Patient tolerated treatment well Patient left: in chair;with call bell/phone within reach   GP     Donell Sievert,  [place] [phone] 12/22/2012, 10:39 AM

## 2012-12-22 NOTE — Consult Note (Signed)
PHARMACY CONSULT NOTE  Pharmacy Consult:  Coumadin Indication: VTE prophylaxis s/p L-TKA  Allergies: Allergies  Allergen Reactions  . Lipitor [Atorvastatin]     "does not like to take"  . Prednisone Other (See Comments)    Can't sleep    Height/Weight: Height: 5' 4.17" (163 cm) Weight: 215 lb 9.8 oz (97.8 kg) IBW/kg (Calculated) : 55.1 Dosing weight 97.8 kg  Vital Signs: BP 140/81  Pulse 66  Temp(Src) 98.4 F (36.9 C) (Oral)  Resp 18  Ht 5' 4.17" (1.63 m)  Wt 215 lb 9.8 oz (97.8 kg)  BMI 36.81 kg/m2  SpO2 96%    Recent Labs  12/21/12 0849 12/22/12 0425  HGB  --  11.6*  HCT  --  34.4*  PLT  --  173  APTT 30  --   LABPROT 13.1 14.3  INR 1.01 1.13  CREATININE  --  0.61   Lab Results  Component Value Date   INR 1.13 12/22/2012   INR 1.01 12/21/2012   INR 1.0 02/20/2007   Estimated Creatinine Clearance: 77.8 ml/min (by C-G formula based on Cr of 0.61).  Medical / Surgical History: Past Medical History  Diagnosis Date  . Coronary stent occlusion   . Arthritis   . Hypertension   . Hyperlipemia   . Heart disease   . Coronary artery disease     stents  06  . Depression   . GERD (gastroesophageal reflux disease)    Past Surgical History  Procedure Laterality Date  . Rotator cuff repair Left     shoulder  . Cholecystectomy    . Lumbar disc surgery    . Tendon repair Right     ankle  . Coronary stent placement      x2  . Cardiac catheterization      Medications:  Prescriptions prior to admission  Medication Sig Dispense Refill  . aspirin EC 81 MG tablet Take 81 mg by mouth every evening.       . Calcium-Magnesium-Vitamin D (CALCIUM 1200+D3 PO) Take 1 capsule by mouth daily.      . cholecalciferol (VITAMIN D) 1000 UNITS tablet Take 1,000 Units by mouth daily.      . clopidogrel (PLAVIX) 75 MG tablet Take 75 mg by mouth every evening.       Marland Kitchen esomeprazole (NEXIUM) 20 MG capsule Take 20 mg by mouth daily before breakfast.      . ezetimibe (ZETIA)  10 MG tablet Take 10 mg by mouth every evening.       . Flaxseed, Linseed, (FLAX SEED OIL PO) Take 2,400 mg by mouth daily.       . isosorbide mononitrate (IMDUR) 60 MG 24 hr tablet Take 60 mg by mouth daily.      . metoprolol succinate (TOPROL-XL) 25 MG 24 hr tablet Take 25 mg by mouth daily.      . Multiple Vitamin (MULTIVITAMIN WITH MINERALS) TABS Take 1 tablet by mouth daily.      Marland Kitchen OVER THE COUNTER MEDICATION Take 1 capsule by mouth 2 (two) times daily. Omega XL  (Fish Oil Supplement)      . rOPINIRole (REQUIP) 0.5 MG tablet Take 1-2 mg by mouth 2 (two) times daily as needed. For restless leg syndrome      . rosuvastatin (CRESTOR) 10 MG tablet Take 10 mg by mouth daily.      . sertraline (ZOLOFT) 50 MG tablet Take 50 mg by mouth daily.       Scheduled:  .  aspirin EC  81 mg Oral QPM  . atorvastatin  20 mg Oral q1800  . cholecalciferol  1,000 Units Oral Daily  . clopidogrel  75 mg Oral QPM  . ezetimibe  10 mg Oral QPM  . isosorbide mononitrate  60 mg Oral Daily  . metoprolol succinate  25 mg Oral Daily  . multivitamin with minerals  1 tablet Oral Daily  . pantoprazole  80 mg Oral Q1200  . sertraline  50 mg Oral Daily  . warfarin  1 each Does not apply Once  . Warfarin - Pharmacist Dosing Inpatient   Does not apply q1800   Anti-infectives   Start     Dose/Rate Route Frequency Ordered Stop   12/21/12 1600  ceFAZolin (ANCEF) IVPB 2 g/50 mL premix     2 g 100 mL/hr over 30 Minutes Intravenous Every 6 hours 12/21/12 1451 12/21/12 2149   12/21/12 0600  ceFAZolin (ANCEF) IVPB 2 g/50 mL premix     2 g 100 mL/hr over 30 Minutes Intravenous On call to O.R. 12/20/12 1520 12/21/12 1018      Assessment:  67 y.o.female s/p L-TKA who started on Coumadin post-op for VTE prophylaxis.    Baseline INR 1.01.  Coumadin Predictor Score = 3.  Post-op day #1. INR today SUBtherapeutic at 1.13, Hgb decreased 13.8>11.6 (likely acute surgical blood loss). No other noted s/s bleeding.    Goal of  Therapy:   INR 2-3      Plan:   Coumadin 5 mg po today. Daily INR's, CBC.  Monitor for bleeding complications.   Margie Billet, PharmD Clinical Pharmacist - Resident Pager: [phone] Pharmacy: [phone] 12/22/2012 7:57 AM

## 2012-12-22 NOTE — Progress Notes (Addendum)
Clinical Social Work Department CLINICAL SOCIAL WORK PLACEMENT NOTE 12/22/2012  Patient:  [name], [name]  Account Number:  [id number] Admit date:  12/21/2012  Clinical Social Worker:  Jetta Lout, Theresia Majors  Date/time:  12/22/2012 07:41 PM  Clinical Social Work is seeking post-discharge placement for this patient at the following level of care:   SKILLED NURSING   (*CSW will update this form in Epic as items are completed)   12/22/2012  Patient/family provided with [facility] Department of Clinical Social Work's list of facilities offering this level of care within the geographic area requested by the patient (or if unable, by the patient's family).  12/22/2012  Patient/family informed of their freedom to choose among providers that offer the needed level of care, that participate in Medicare, Medicaid or managed care program needed by the patient, have an available bed and are willing to accept the patient.  12/22/2012  Patient/family informed of [facility]' ownership interest in [facility], as well as of the fact that they are under [name] obligation to receive care at this facility.  PASARR submitted to EDS on 12/22/2012 PASARR number received from EDS on 12/22/2012  FL2 transmitted to all facilities in geographic area requested by pt/family on  12/22/2012 FL2 transmitted to all facilities within larger geographic area on   Patient informed that his/her managed care company has contracts with or will negotiate with  certain facilities, including the following:     Patient/family informed of bed offers received:  12/24/2012 Patient chooses bed at [facility] [place] Physician recommends and patient chooses bed at    Patient to be transferred to  on  12/24/2012 Patient to be transferred to facility by [facility]  The following physician request were entered in Epic:   Additional Comments: Patient's first choice is [facility] in [place].

## 2012-12-22 NOTE — Op Note (Signed)
[name], [name] NO.:  [id number]  MEDICAL RECORD NO.:  [id number]  LOCATION:  5N07C                        FACILITY:  [facility]  PHYSICIAN:  Almedia Balls. Ranell Patrick, M.D. DATE OF BIRTH:  1945/09/26  DATE OF PROCEDURE:  12/21/2012 DATE OF DISCHARGE:                              OPERATIVE REPORT   PREOPERATIVE DIAGNOSIS:  Left knee end-stage osteoarthritis.  POSTOPERATIVE DIAGNOSIS:  Left knee end-stage osteoarthritis.  PROCEDURE PERFORMED:  Left total knee replacement using DePuy Sigma rotating platform prosthesis.  ATTENDING SURGEON:  Almedia Balls. Ranell Patrick, M.D.  ASSISTANT:  Donnie Coffin. Dixon, P.A. who scrubbed the entire procedure and necessary for satisfactory completion of surgery.  ANESTHESIA:  General anesthesia was used plus femoral block.  ESTIMATED BLOOD LOSS:  Minimal.  FLUID REPLACED:  1500 mL of crystalloid.  TOURNIQUET TIME:  1 hour and 30 minutes at 350 mmHg.  INSTRUMENT COUNTS:  Correct.  COMPLICATIONS:  There were no complications.  ANTIBIOTICS:  Perioperative antibiotics were given.  INDICATIONS:  The patient is a 67 year old female with worsening left knee pain secondary to end-stage arthritis.  The patient has bone-on- bone arthritis on standing x-rays, cannot walk more than a block secondary to pain having to sit down.  Uses an assistive device for ambulation.  Having discussed options of management and having failed conservative management including modification of activities, anti- inflammatories, injections, and pain medications, the patient presents for operative treatment.  Informed consent obtained.  DESCRIPTION OF PROCEDURE:  After adequate level of anesthesia was achieved, the patient was positioned supine on the operating room table. Left leg correctly identified.  Nonsterile tourniquet placed on proximal thigh.  Left leg sterilely prepped and draped in usual manner.  Time-out called.  After elevation of limb and exsanguination  using Esmarch bandage, we elevated the tourniquet to 350 mmHg.  The patient's knee was placed in flexion.  Longitudinal midline incision was created with a 10- blade scalpel.  Dissection down through subcutaneous tissues using a scalpel.  Parapatellar arthrotomy created with a fresh 10-blade scalpel. Lateral patellofemoral ligament divided.  The patella was everted and the knee was flexed.  We entered the distal femur using a step-cut drill.  We resected 10 mm initially off the distal femur, 3 degrees left.  We then went ahead and did our anterior-posterior chamfer cuts with a 4:1 block.  We resected ACL, PCL, and meniscal tissues, subluxed the tibia anteriorly, and performed our tibial cut using an external alignment jig 90 degrees perpendicular long axis of the tibia with minimal posterior slope.  We removed excess posterior bone off the posterior aspect of the femur.  We then went ahead and checked our flexion/extension gaps, and our flexion gap was fine at 10 mm, but our extension gap was too tight.  We then made a decision go ahead and resect additional 3 mm bone off the distal femur and performed chamfer cuts.  We did that using the intramedullary guide again.  We started initially at 0 mm resection and then just slid that back to the neck slotted holes for 2 mm resection off the distal femur.  We cut our chamfer cuts with a 4:1 block and then went ahead and completed  our tibial preparation with the modular drilling keel punch for the 3 tibia and then also the femoral preparation with the box cut guide for posterior cruciate substituting prosthesis.  Once we had done that resection with the box cut, we went ahead and placed our trial components, size 3 tibia, size 3 left femur, and then initially 0 then a 12.5 poly insert.  We then resurfaced the patella going from a 25 mm thickness down to 16 and cut that using the patellar jig, and then went ahead and drilled our lug holes for the  35 patellar button and then placed that 35 in place.  We then ranged the knee, we were happy with patellar tracking with a no touch technique.  We removed all trial components, pulse irrigated the knee, dried the bone thoroughly, and then cemented the components into place, 3 tibia, 3 left femur, 12.5 poly insert for a trial, and then the 35 patellar button cemented into place.  Once the cement was allowed to harden, we removed excess cement with quarter-inch curved osteotome.  Inspected the posterior aspect of the knee, thoroughly irrigated with pulse irrigator, and then placed the real 12.5, three 12.5 poly insert in place.  We reduced the knee again happy with a nice snap on the medial side, and excellent stability and range of motion and alignment.  The knee was stable both in flexion and extension.  We then went ahead and placed the knee in slight flexion, closed the parapatellar arthrotomy with interrupted #1 Vicryl suture followed by 2-0 Vicryl subcutaneous closure and 4-0 Monocryl for skin. Steri-Strips applied followed by sterile dressing.  The patient tolerated the procedure well.     Almedia Balls. Ranell Patrick, M.D.     SRN/MEDQ  D:  12/21/2012  T:  12/22/2012  Job:  [id number]

## 2012-12-22 NOTE — Progress Notes (Signed)
Clinical Social Work Department BRIEF PSYCHOSOCIAL ASSESSMENT 12/22/2012  Patient:  [name], [name]     Account Number:  [id number]     Admit date:  12/21/2012  Clinical Social Worker:  Hendricks Milo  Date/Time:  12/22/2012 07:36 PM  Referred by:  Physician  Date Referred:  12/22/2012  Other Referral:   Interview type:  Patient Other interview type:   Daughter [name] was at bedside [phone].    PSYCHOSOCIAL DATA Living Status:  ALONE Admitted from facility:   Level of care:   Primary support name:  [name] Primary support relationship to patient:  CHILD, ADULT Degree of support available:   Very supportive at bedside at time of assessment.    CURRENT CONCERNS  Other Concerns:    SOCIAL WORK ASSESSMENT / PLAN CSW met with patient and daughter [name]. Patient reported that she was pre-registered at [facility] in [place] and her MD has been in contact with that facility as well. [facility] in [place] is her first choice. Patient was agreeable to back up SNF search in [place] and [place]. Patient's second choice would be [facility] in [facility]. Patient's third choice is the two facilities in [place].   Assessment/plan status:  Psychosocial Support/Ongoing Assessment of Needs Other assessment/ plan:   Information/referral to community resources:   CSW gave patient SNF list.    PATIENT'S/FAMILY'S RESPONSE TO PLAN OF CARE: Patient's first choice is [facility] in [place].

## 2012-12-23 LAB — PROTIME-INR
INR: 1.28 (ref 0.00–1.49)
Prothrombin Time: 15.7 seconds — ABNORMAL HIGH (ref 11.6–15.2)

## 2012-12-23 LAB — CBC
HCT: 30.4 % — ABNORMAL LOW (ref 36.0–46.0)
Hemoglobin: 10.3 g/dL — ABNORMAL LOW (ref 12.0–15.0)
MCH: 30.9 pg (ref 26.0–34.0)
MCHC: 33.9 g/dL (ref 30.0–36.0)
MCV: 91.3 fL (ref 78.0–100.0)
Platelets: 145 10*3/uL — ABNORMAL LOW (ref 150–400)
RBC: 3.33 MIL/uL — ABNORMAL LOW (ref 3.87–5.11)
RDW: 13.1 % (ref 11.5–15.5)
WBC: 11.9 10*3/uL — ABNORMAL HIGH (ref 4.0–10.5)

## 2012-12-23 MED ORDER — INFLUENZA VAC SPLIT QUAD 0.5 ML IM SUSP
0.5000 mL | INTRAMUSCULAR | Status: DC
  Filled 2012-12-23 (×2): qty 0.5

## 2012-12-23 MED ORDER — WARFARIN SODIUM 7.5 MG PO TABS
7.5000 mg | ORAL_TABLET | Freq: Once | ORAL | Status: AC
  Administered 2012-12-23: 7.5 mg via ORAL
  Filled 2012-12-23 (×2): qty 1

## 2012-12-23 NOTE — Progress Notes (Signed)
Patient ID: [name], female   DOB: 1945/06/17, 67 y.o.   MRN: [id number] Subjective: 2 Days Post-Op Procedure(s) (LRB): LEFT TOTAL KNEE ARTHROPLASTY (Left)    Patient reports pain as moderate.  Resting this am waiting for therapy, no events reported.  Waiting for SNF  Objective:   VITALS:   Filed Vitals:   12/22/12 2038  BP: 143/71  Pulse: 65  Temp: 99.5 F (37.5 C)  Resp: 18    Neurovascular intact Incision: dressing C/D/I Dressing changed today   LABS  Recent Labs  12/22/12 0425 12/23/12 0627  HGB 11.6* 10.3*  HCT 34.4* 30.4*  WBC 11.4* 11.9*  PLT 173 145*     Recent Labs  12/22/12 0425  NA 138  K 4.2  BUN 16  CREATININE 0.61  GLUCOSE 107*     Recent Labs  12/22/12 0425 12/23/12 0627  INR 1.13 1.28     Assessment/Plan: 2 Days Post-Op Procedure(s) (LRB): LEFT TOTAL KNEE ARTHROPLASTY (Left)   Advance diet Up with therapy Discharge to SNF tomorrow - [facility] in [place]

## 2012-12-23 NOTE — Progress Notes (Signed)
Physical Therapy Treatment Patient Details Name: [name] MRN: [id number] DOB: May 06, 1945 Today's Date: 12/23/2012 Time: 1005-1030 PT Time Calculation (min): 25 min  PT Assessment / Plan / Recommendation  History of Present Illness Patient is a 67 yo female s/p Lt TKA   PT Comments   Pt slowly progressing. Continues to demo decr safety awareness, especially with stand to sit transfers. Pt limited in mobility due to pain. Cont to follow acutely till D/C to ST SNF for continued rehab.  Follow Up Recommendations  SNF     Does the patient have the potential to tolerate intense rehabilitation     Barriers to Discharge        Equipment Recommendations  3in1 (PT)    Recommendations for Other Services    Frequency 7X/week   Progress towards PT Goals Progress towards PT goals: Progressing toward goals  Plan Current plan remains appropriate    Precautions / Restrictions Precautions Precautions: Knee;Fall Precaution Comments: no pillow under the knee  Required Braces or Orthoses: Knee Immobilizer - Left Knee Immobilizer - Left: On except when in CPM Restrictions Weight Bearing Restrictions: Yes LLE Weight Bearing: Weight bearing as tolerated   Pertinent Vitals/Pain 8/10 when amb but at rest pt states pain is "easing up"; pt premedicated    Mobility  Bed Mobility Bed Mobility: Not assessed Transfers Transfers: Sit to Stand;Stand to Sit Sit to Stand: 4: Min assist;From chair/3-in-1;With armrests;With upper extremity assist Stand to Sit: 4: Min assist;To chair/3-in-1;With armrests;With upper extremity assist Details for Transfer Assistance: (A) to achieve upright standing position and control descent to chair; mod cues for hand placement and safety during transfers Ambulation/Gait Ambulation/Gait Assistance: 4: Min assist Ambulation Distance (Feet): 30 Feet Assistive device: Rolling walker Ambulation/Gait Assistance Details: min (A) to maintain balance and manage RW; pt  requires incr time and rest breaks due to fatigue; cues for deep breathing during amb; pt tends hold breath due to pain when attempting to WB on Lt LE; pt relies heavily on UEs; cues for sequencing and safety  Gait Pattern: Step-to pattern;Decreased stance time - left;Decreased step length - right;Trunk flexed Gait velocity: decreased General Gait Details: cues for upright posture  Stairs: No    Exercises Total Joint Exercises Ankle Circles/Pumps: AROM;Both;10 reps;Seated Quad Sets: AROM;Left;10 reps Heel Slides: PROM;Left;10 reps (5 reps x2; requires break due to pain) Hip ABduction/ADduction: AAROM;Left;10 reps (with use of UEs ) Long Arc Quad: AAROM;Left;10 reps (5 reps x 2; due to pain) Goniometric ROM: PROM Lt knee flex to 70 degrees; c/o pain in sititng   PT Diagnosis:    PT Problem List:   PT Treatment Interventions:     PT Goals (current goals can now be found in the care plan section) Acute Rehab PT Goals Patient Stated Goal: to go to rehab then home by myself  PT Goal Formulation: With patient Time For Goal Achievement: 01/04/13 Potential to Achieve Goals: Good  Visit Information  Last PT Received On: 12/23/12 Assistance Needed: +1 History of Present Illness: Patient is a 67 yo female s/p Lt TKA    Subjective Data  Subjective: pt in chair; Tech reports pt continues to "flop" into chair unexepectantly. pt agreeable to therapy.  Patient Stated Goal: to go to rehab then home by myself    Cognition  Cognition Arousal/Alertness: Awake/alert Behavior During Therapy: Impulsive Overall Cognitive Status: Within Functional Limits for tasks assessed    Balance  Balance Balance Assessed: No  End of Session PT - End of Session  Equipment Utilized During Treatment: Gait belt;Left knee immobilizer Activity Tolerance: Patient tolerated treatment well Patient left: in chair;with call bell/phone within reach Nurse Communication: Mobility status   GP     Donell Sievert,  [place] [phone] 12/23/2012, 10:44 AM

## 2012-12-23 NOTE — Consult Note (Signed)
PHARMACY CONSULT NOTE  Pharmacy Consult:  Coumadin Indication: VTE prophylaxis s/p L-TKA  Allergies: Allergies  Allergen Reactions  . Lipitor [Atorvastatin]     "does not like to take"  . Prednisone Other (See Comments)    Can't sleep    Height/Weight: Height: 5' 4.17" (163 cm) Weight: 215 lb 9.8 oz (97.8 kg) IBW/kg (Calculated) : 55.1 Dosing weight 97.8 kg  Vital Signs: BP 143/71  Pulse 65  Temp(Src) 99.5 F (37.5 C) (Oral)  Resp 18  Ht 5' 4.17" (1.63 m)  Wt 215 lb 9.8 oz (97.8 kg)  BMI 36.81 kg/m2  SpO2 92%    Recent Labs  12/21/12 0849 12/22/12 0425 12/23/12 0627  HGB  --  11.6* 10.3*  HCT  --  34.4* 30.4*  PLT  --  173 145*  APTT 30  --   --   LABPROT 13.1 14.3 15.7*  INR 1.01 1.13 1.28  CREATININE  --  0.61  --    Lab Results  Component Value Date   INR 1.28 12/23/2012   INR 1.13 12/22/2012   INR 1.01 12/21/2012   Estimated Creatinine Clearance: 77.8 ml/min (by C-G formula based on Cr of 0.61).  Medical / Surgical History: Past Medical History  Diagnosis Date  . Coronary stent occlusion   . Arthritis   . Hypertension   . Hyperlipemia   . Heart disease   . Coronary artery disease     stents  06  . Depression   . GERD (gastroesophageal reflux disease)    Past Surgical History  Procedure Laterality Date  . Rotator cuff repair Left     shoulder  . Cholecystectomy    . Lumbar disc surgery    . Tendon repair Right     ankle  . Coronary stent placement      x2  . Cardiac catheterization      Medications:  Prescriptions prior to admission  Medication Sig Dispense Refill  . aspirin EC 81 MG tablet Take 81 mg by mouth every evening.       . Calcium-Magnesium-Vitamin D (CALCIUM 1200+D3 PO) Take 1 capsule by mouth daily.      . cholecalciferol (VITAMIN D) 1000 UNITS tablet Take 1,000 Units by mouth daily.      . clopidogrel (PLAVIX) 75 MG tablet Take 75 mg by mouth every evening.       Marland Kitchen esomeprazole (NEXIUM) 20 MG capsule Take 20 mg by  mouth daily before breakfast.      . ezetimibe (ZETIA) 10 MG tablet Take 10 mg by mouth every evening.       . Flaxseed, Linseed, (FLAX SEED OIL PO) Take 2,400 mg by mouth daily.       . isosorbide mononitrate (IMDUR) 60 MG 24 hr tablet Take 60 mg by mouth daily.      . metoprolol succinate (TOPROL-XL) 25 MG 24 hr tablet Take 25 mg by mouth daily.      . Multiple Vitamin (MULTIVITAMIN WITH MINERALS) TABS Take 1 tablet by mouth daily.      Marland Kitchen OVER THE COUNTER MEDICATION Take 1 capsule by mouth 2 (two) times daily. Omega XL  (Fish Oil Supplement)      . rOPINIRole (REQUIP) 0.5 MG tablet Take 1-2 mg by mouth 2 (two) times daily as needed. For restless leg syndrome      . rosuvastatin (CRESTOR) 10 MG tablet Take 10 mg by mouth daily.      . sertraline (ZOLOFT) 50 MG tablet Take  50 mg by mouth daily.       Scheduled:  . aspirin EC  81 mg Oral QPM  . atorvastatin  20 mg Oral q1800  . cholecalciferol  1,000 Units Oral Daily  . clopidogrel  75 mg Oral QPM  . ezetimibe  10 mg Oral QPM  . [START ON 12/24/2012] influenza vac split quadrivalent PF  0.5 mL Intramuscular Tomorrow-1000  . isosorbide mononitrate  60 mg Oral Daily  . metoprolol succinate  25 mg Oral Daily  . multivitamin with minerals  1 tablet Oral Daily  . pantoprazole  80 mg Oral Q1200  . sertraline  50 mg Oral Daily  . warfarin  1 each Does not apply Once  . Warfarin - Pharmacist Dosing Inpatient   Does not apply q1800   Anti-infectives   Start     Dose/Rate Route Frequency Ordered Stop   12/21/12 1600  ceFAZolin (ANCEF) IVPB 2 g/50 mL premix     2 g 100 mL/hr over 30 Minutes Intravenous Every 6 hours 12/21/12 1451 12/21/12 2149   12/21/12 0600  ceFAZolin (ANCEF) IVPB 2 g/50 mL premix     2 g 100 mL/hr over 30 Minutes Intravenous On call to O.R. 12/20/12 1520 12/21/12 1018      Assessment:  67 y.o.female s/p L-TKA who started on Coumadin post-op for VTE prophylaxis.    Baseline INR 1.01.  Coumadin Predictor Score =  3.  Post-op day #2. INR today SUBtherapeutic but slightly trending up 1.13>1.28, Hgb decreased since surgery 11.6>10.3. No noted s/s bleeding. Awaiting SNF placement so will increase dose slightly but cautiously since hgb still trending down.   Goal of Therapy:   INR 2-3      Plan:   Coumadin 7.5 mg po today Daily INR's, CBC.  Monitor for bleeding complications.  Margie Billet, PharmD Clinical Pharmacist - Resident Pager: [phone] Pharmacy: [phone] 12/23/2012 10:28 AM

## 2012-12-24 ENCOUNTER — Encounter (HOSPITAL_COMMUNITY): Payer: Self-pay | Admitting: General Practice

## 2012-12-24 DIAGNOSIS — F329 Major depressive disorder, single episode, unspecified: Secondary | ICD-10-CM | POA: Diagnosis not present

## 2012-12-24 DIAGNOSIS — I2510 Atherosclerotic heart disease of native coronary artery without angina pectoris: Secondary | ICD-10-CM | POA: Diagnosis not present

## 2012-12-24 DIAGNOSIS — G8918 Other acute postprocedural pain: Secondary | ICD-10-CM | POA: Diagnosis not present

## 2012-12-24 DIAGNOSIS — Z96659 Presence of unspecified artificial knee joint: Secondary | ICD-10-CM | POA: Diagnosis not present

## 2012-12-24 DIAGNOSIS — R262 Difficulty in walking, not elsewhere classified: Secondary | ICD-10-CM | POA: Diagnosis not present

## 2012-12-24 DIAGNOSIS — I119 Hypertensive heart disease without heart failure: Secondary | ICD-10-CM | POA: Diagnosis not present

## 2012-12-24 DIAGNOSIS — I10 Essential (primary) hypertension: Secondary | ICD-10-CM | POA: Diagnosis not present

## 2012-12-24 DIAGNOSIS — M1990 Unspecified osteoarthritis, unspecified site: Secondary | ICD-10-CM | POA: Diagnosis not present

## 2012-12-24 DIAGNOSIS — D638 Anemia in other chronic diseases classified elsewhere: Secondary | ICD-10-CM | POA: Diagnosis not present

## 2012-12-24 DIAGNOSIS — E785 Hyperlipidemia, unspecified: Secondary | ICD-10-CM | POA: Diagnosis not present

## 2012-12-24 DIAGNOSIS — K219 Gastro-esophageal reflux disease without esophagitis: Secondary | ICD-10-CM | POA: Diagnosis not present

## 2012-12-24 DIAGNOSIS — Z471 Aftercare following joint replacement surgery: Secondary | ICD-10-CM | POA: Diagnosis not present

## 2012-12-24 DIAGNOSIS — D649 Anemia, unspecified: Secondary | ICD-10-CM | POA: Diagnosis not present

## 2012-12-24 DIAGNOSIS — M1710 Unilateral primary osteoarthritis, unspecified knee: Secondary | ICD-10-CM | POA: Diagnosis not present

## 2012-12-24 DIAGNOSIS — IMO0002 Reserved for concepts with insufficient information to code with codable children: Secondary | ICD-10-CM | POA: Diagnosis not present

## 2012-12-24 LAB — CBC
HCT: 30.3 % — ABNORMAL LOW (ref 36.0–46.0)
Hemoglobin: 10.3 g/dL — ABNORMAL LOW (ref 12.0–15.0)
MCH: 30.8 pg (ref 26.0–34.0)
MCHC: 34 g/dL (ref 30.0–36.0)
MCV: 90.7 fL (ref 78.0–100.0)
Platelets: 156 10*3/uL (ref 150–400)
RBC: 3.34 MIL/uL — ABNORMAL LOW (ref 3.87–5.11)
RDW: 13 % (ref 11.5–15.5)
WBC: 10 10*3/uL (ref 4.0–10.5)

## 2012-12-24 LAB — PROTIME-INR
INR: 1.49 (ref 0.00–1.49)
Prothrombin Time: 17.6 seconds — ABNORMAL HIGH (ref 11.6–15.2)

## 2012-12-24 MED ORDER — WARFARIN SODIUM 5 MG PO TABS
5.0000 mg | ORAL_TABLET | Freq: Once | ORAL | Status: DC
  Filled 2012-12-24: qty 1

## 2012-12-24 NOTE — Progress Notes (Signed)
Physical Therapy Treatment Patient Details Name: [name] MRN: [id number] DOB: 06/01/1945 Today's Date: 12/24/2012 Time: 0820-0850 PT Time Calculation (min): 30 min  PT Assessment / Plan / Recommendation  History of Present Illness Patient is a 67 yo female s/p Lt TKA   PT Comments   Pt slowly progressing with mobility. Pt able to incr amb distance and theraex this session. Amb at very decr speed due to pain and anxiety. Pt to benefit from ST SNF upon acute D/C to incr independence prior to returning home, where she lives alone. Cont to f/u with pt while in acute setting till D/C to [facility].  Follow Up Recommendations  SNF     Does the patient have the potential to tolerate intense rehabilitation     Barriers to Discharge        Equipment Recommendations  3in1 (PT)    Recommendations for Other Services    Frequency 7X/week   Progress towards PT Goals Progress towards PT goals: Progressing toward goals  Plan Current plan remains appropriate    Precautions / Restrictions Precautions Precautions: Knee;Fall Precaution Comments: no pillow under the knee  Required Braces or Orthoses: Knee Immobilizer - Left Knee Immobilizer - Left: On except when in CPM Restrictions Weight Bearing Restrictions: Yes LLE Weight Bearing: Weight bearing as tolerated   Pertinent Vitals/Pain 7/10; patient repositioned for comfort    Mobility  Bed Mobility Bed Mobility: Supine to Sit Supine to Sit: 4: Min assist;HOB elevated;With rails Details for Bed Mobility Assistance: (A) to advance Lt LE to/off EOB; cues for hand placement and sequencing  Transfers Transfers: Sit to Stand;Stand to Sit Sit to Stand: 4: Min assist;From bed;From chair/3-in-1;With armrests;With upper extremity assist Stand to Sit: 4: Min assist;To chair/3-in-1;With armrests;With upper extremity assist Details for Transfer Assistance: (A) to achieve upright standing position and to control descent to chair position; cues  for hand placement and safety; pt with c/o incr pain with knee flex upon descent to chair  Ambulation/Gait Ambulation/Gait Assistance: 4: Min guard Ambulation Distance (Feet): 50 Feet Assistive device: Rolling walker Ambulation/Gait Assistance Details: cues for gt sequencing and min guard to steady and manage RW; no noted buckling of Lt LE today; pt amb at very slow gt speed; cues for upright possture  Gait Pattern: Step-to pattern;Decreased stance time - left;Decreased step length - right;Trunk flexed Gait velocity: very decreased  Stairs: No Wheelchair Mobility Wheelchair Mobility: No    Exercises Total Joint Exercises Ankle Circles/Pumps: AROM;Both;10 reps Quad Sets: AROM;Left;10 reps Heel Slides: PROM;Left;10 reps;Seated Hip ABduction/ADduction: AAROM;Left;10 reps;Supine Knee Flexion: PROM;Left;10 reps;Seated Goniometric ROM: Lt Knee flex PROM to 70 degrees limited by pain in sitting   PT Diagnosis:    PT Problem List:   PT Treatment Interventions:     PT Goals (current goals can now be found in the care plan section) Acute Rehab PT Goals Patient Stated Goal: to go to rehab then home by myself  PT Goal Formulation: With patient Time For Goal Achievement: 01/04/13 Potential to Achieve Goals: Good  Visit Information  Last PT Received On: 12/24/12 Assistance Needed: +1 History of Present Illness: Patient is a 67 yo female s/p Lt TKA    Subjective Data  Subjective: pt lying supine; agreeable to therapy; requesting to go to bathroom after amb in hall Patient Stated Goal: to go to rehab then home by myself    Cognition  Cognition Arousal/Alertness: Awake/alert Behavior During Therapy: Impulsive Overall Cognitive Status: Within Functional Limits for tasks assessed  Balance  Balance Balance Assessed: No  End of Session PT - End of Session Equipment Utilized During Treatment: Gait belt;Left knee immobilizer Activity Tolerance: Patient tolerated treatment well Patient  left: in chair;with call bell/phone within reach Nurse Communication: Mobility status   GP     Donell Sievert, [place] [phone] 12/24/2012, 9:12 AM

## 2012-12-24 NOTE — Progress Notes (Signed)
Clinical social worker assisted with patient discharge to skilled nursing facility, [facility].  CSW addressed all family questions and concerns. CSW copied chart and added all important documents. CSW also set up patient transportation with [facility]. Clinical Social Worker will sign off for now as social work intervention is no longer needed.   Onesha Krebbs, MSW, LCSWA [phone] 

## 2012-12-24 NOTE — Progress Notes (Signed)
   Subjective: 3 Days Post-Op Procedure(s) (LRB): LEFT TOTAL KNEE ARTHROPLASTY (Left)  Moderate pain to left knee earlier today Ready for d/c to snf Patient reports pain as moderate.  Objective:   VITALS:   Filed Vitals:   12/24/12 0655  BP: 140/62  Pulse: 68  Temp: 99.8 F (37.7 C)  Resp: 18    Left knee incision healing well nv intact distally No rashes or edema  LABS  Recent Labs  12/22/12 0425 12/23/12 0627 12/24/12 0650  HGB 11.6* 10.3* 10.3*  HCT 34.4* 30.4* 30.3*  WBC 11.4* 11.9* 10.0  PLT 173 145* 156     Recent Labs  12/22/12 0425  NA 138  K 4.2  BUN 16  CREATININE 0.61  GLUCOSE 107*     Assessment/Plan: 3 Days Post-Op Procedure(s) (LRB): LEFT TOTAL KNEE ARTHROPLASTY (Left)  Continue PT/Ot Pain management D/c to snf if bed available   Alphonsa Overall, MPAS, PA-C  12/24/2012, 8:00 AM

## 2012-12-24 NOTE — Discharge Summary (Signed)
Physician Discharge Summary   Patient ID: [name] MRN: [id number] DOB/AGE: Nov 07, 1945 67 y.o.  Admit date: 12/21/2012 Discharge date: 12/24/2012  Admission Diagnoses:  Left knee end stage osteoarthritis  Discharge Diagnoses:  Same   Surgeries: Procedure(s): LEFT TOTAL KNEE ARTHROPLASTY on 12/21/2012   Consultants: PT/Ot  Discharged Condition: Stable  Hospital Course: [name] is an 67 y.o. female who was admitted 12/21/2012 with a chief complaint of No chief complaint on file. , and found to have a diagnosis of <principal problem not specified>.  They were brought to the operating room on 12/21/2012 and underwent the above named procedures.    The patient had an uncomplicated hospital course and was stable for discharge.  Recent vital signs:  Filed Vitals:   12/24/12 0655  BP: 140/62  Pulse: 68  Temp: 99.8 F (37.7 C)  Resp: 18    Recent laboratory studies:  Results for orders placed during the hospital encounter of 12/21/12  APTT      Result Value Range   aPTT 30  24 - 37 seconds  PROTIME-INR      Result Value Range   Prothrombin Time 13.1  11.6 - 15.2 seconds   INR 1.01  0.00 - 1.49  PROTIME-INR      Result Value Range   Prothrombin Time 14.3  11.6 - 15.2 seconds   INR 1.13  0.00 - 1.49  CBC      Result Value Range   WBC 11.4 (*) 4.0 - 10.5 K/uL   RBC 3.79 (*) 3.87 - 5.11 MIL/uL   Hemoglobin 11.6 (*) 12.0 - 15.0 g/dL   HCT 95.6 (*) 21.3 - 08.6 %   MCV 90.8  78.0 - 100.0 fL   MCH 30.6  26.0 - 34.0 pg   MCHC 33.7  30.0 - 36.0 g/dL   RDW 57.8  46.9 - 62.9 %   Platelets 173  150 - 400 K/uL  BASIC METABOLIC PANEL      Result Value Range   Sodium 138  135 - 145 mEq/L   Potassium 4.2  3.5 - 5.1 mEq/L   Chloride 102  96 - 112 mEq/L   CO2 26  19 - 32 mEq/L   Glucose, Bld 107 (*) 70 - 99 mg/dL   BUN 16  6 - 23 mg/dL   Creatinine, Ser 5.28  0.50 - 1.10 mg/dL   Calcium 9.0  8.4 - 41.3 mg/dL   GFR calc non Af Amer >90  >90 mL/min   GFR calc  Af Amer >90  >90 mL/min  PROTIME-INR      Result Value Range   Prothrombin Time 15.7 (*) 11.6 - 15.2 seconds   INR 1.28  0.00 - 1.49  CBC      Result Value Range   WBC 11.9 (*) 4.0 - 10.5 K/uL   RBC 3.33 (*) 3.87 - 5.11 MIL/uL   Hemoglobin 10.3 (*) 12.0 - 15.0 g/dL   HCT 24.4 (*) 01.0 - 27.2 %   MCV 91.3  78.0 - 100.0 fL   MCH 30.9  26.0 - 34.0 pg   MCHC 33.9  30.0 - 36.0 g/dL   RDW 53.6  64.4 - 03.4 %   Platelets 145 (*) 150 - 400 K/uL  PROTIME-INR      Result Value Range   Prothrombin Time 17.6 (*) 11.6 - 15.2 seconds   INR 1.49  0.00 - 1.49  CBC      Result Value Range   WBC  10.0  4.0 - 10.5 K/uL   RBC 3.34 (*) 3.87 - 5.11 MIL/uL   Hemoglobin 10.3 (*) 12.0 - 15.0 g/dL   HCT 16.1 (*) 09.6 - 04.5 %   MCV 90.7  78.0 - 100.0 fL   MCH 30.8  26.0 - 34.0 pg   MCHC 34.0  30.0 - 36.0 g/dL   RDW 40.9  81.1 - 91.4 %   Platelets 156  150 - 400 K/uL    Discharge Medications:     Medication List         aspirin EC 81 MG tablet  Take 81 mg by mouth every evening.     CALCIUM 1200+D3 PO  Take 1 capsule by mouth daily.     cholecalciferol 1000 UNITS tablet  Commonly known as:  VITAMIN D  Take 1,000 Units by mouth daily.     clopidogrel 75 MG tablet  Commonly known as:  PLAVIX  Take 75 mg by mouth every evening.     esomeprazole 20 MG capsule  Commonly known as:  NEXIUM  Take 20 mg by mouth daily before breakfast.     ezetimibe 10 MG tablet  Commonly known as:  ZETIA  Take 10 mg by mouth every evening.     FLAX SEED OIL PO  Take 2,400 mg by mouth daily.     isosorbide mononitrate 60 MG 24 hr tablet  Commonly known as:  IMDUR  Take 60 mg by mouth daily.     methocarbamol 500 MG tablet  Commonly known as:  ROBAXIN  Take 1 tablet (500 mg total) by mouth 3 (three) times daily as needed.     metoprolol succinate 25 MG 24 hr tablet  Commonly known as:  TOPROL-XL  Take 25 mg by mouth daily.     multivitamin with minerals Tabs tablet  Take 1 tablet by mouth daily.      OVER THE COUNTER MEDICATION  Take 1 capsule by mouth 2 (two) times daily. Omega XL  (Fish Oil Supplement)     oxyCODONE-acetaminophen 5-325 MG per tablet  Commonly known as:  ROXICET  Take 1 tablet by mouth every 4 (four) hours as needed for pain.     rOPINIRole 0.5 MG tablet  Commonly known as:  REQUIP  Take 1-2 mg by mouth 2 (two) times daily as needed. For restless leg syndrome     rosuvastatin 10 MG tablet  Commonly known as:  CRESTOR  Take 10 mg by mouth daily.     sertraline 50 MG tablet  Commonly known as:  ZOLOFT  Take 50 mg by mouth daily.     warfarin 5 MG tablet  Commonly known as:  COUMADIN  Take 1 tablet (5 mg total) by mouth daily.        Diagnostic Studies: Dg Knee Left Port  12/21/2012   CLINICAL DATA:  Left total knee replacement.  EXAM: PORTABLE LEFT KNEE - 1-2 VIEW  COMPARISON:  None.  FINDINGS: A total knee prosthesis is in place. Expected gas in adjacent soft tissues. No fracture or complicating feature is radiographically apparent.  IMPRESSION: 1. Left total knee prosthesis placement, without complicating feature observed.   Electronically Signed   By: Herbie Baltimore   On: 12/21/2012 16:24    Disposition: 01-Home or Self Care       Future Appointments Provider Department Dept Phone   02/13/2013 11:00 AM Quintella Reichert, MD [facility] [phone]   03/04/2013 9:20 AM [facility]  [place] Office [phone]   08/28/2013 3:00 PM Suanne Marker, MD [facility] [phone]      Follow-up Information   Follow up with NORRIS,STEVEN R, MD. Call in 2 weeks. [phone])    Specialty:  Orthopedic Surgery   Contact information:   [address] [place] [address] [phone]        Signed: Thea Gist 12/24/2012, 8:01 AM

## 2012-12-24 NOTE — Progress Notes (Signed)
ANTICOAGULATION CONSULT NOTE - Follow Up Consult  Pharmacy Consult for warfarin Indication: VTE prophylaxis s/p L-TKA  Allergies  Allergen Reactions  . Lipitor [Atorvastatin]     "does not like to take"  . Prednisone Other (See Comments)    Can't sleep    Patient Measurements: Height: 5' 4.17" (163 cm) Weight: 215 lb 9.8 oz (97.8 kg) IBW/kg (Calculated) : 55.1   Vital Signs: Temp: 99.8 F (37.7 C) (09/29 0655) BP: 140/62 mmHg (09/29 0655) Pulse Rate: 68 (09/29 0655)  Labs:  Recent Labs  12/22/12 0425 12/23/12 0627 12/24/12 0650  HGB 11.6* 10.3* 10.3*  HCT 34.4* 30.4* 30.3*  PLT 173 145* 156  LABPROT 14.3 15.7* 17.6*  INR 1.13 1.28 1.49  CREATININE 0.61  --   --     Estimated Creatinine Clearance: 77.8 ml/min (by C-G formula based on Cr of 0.61).   Medications:  Scheduled:  . aspirin EC  81 mg Oral QPM  . atorvastatin  20 mg Oral q1800  . cholecalciferol  1,000 Units Oral Daily  . clopidogrel  75 mg Oral QPM  . ezetimibe  10 mg Oral QPM  . influenza vac split quadrivalent PF  0.5 mL Intramuscular Tomorrow-1000  . isosorbide mononitrate  60 mg Oral Daily  . metoprolol succinate  25 mg Oral Daily  . multivitamin with minerals  1 tablet Oral Daily  . pantoprazole  80 mg Oral Q1200  . sertraline  50 mg Oral Daily  . warfarin  1 each Does not apply Once  . Warfarin - Pharmacist Dosing Inpatient   Does not apply q1800    Assessment: 42 YOF s/p L-TKA started on warfarin for VTE prophylaxis. INR has increased nicely and is 1.49 today. Patient to be discharged to SNF when bed is available. No bleeding noted. CBC is stable post-op.   Goal of Therapy:  INR 2-3 Monitor platelets by anticoagulation protocol: Yes   Plan:  1. Recommend warfarin 5mg  daily when patient is discharged with INR check 2 days after discharge date 2. Until patient is discharged, she will receive daily INR checks 3. Follow up for any signs/symptoms of bleeding  Mykal Kirchman D. Kenyana Husak,  PharmD Clinical Pharmacist Pager: [phone] 12/24/2012 11:26 AM

## 2012-12-25 ENCOUNTER — Encounter (HOSPITAL_COMMUNITY): Payer: Self-pay | Admitting: Orthopedic Surgery

## 2012-12-25 DIAGNOSIS — I2510 Atherosclerotic heart disease of native coronary artery without angina pectoris: Secondary | ICD-10-CM | POA: Diagnosis not present

## 2012-12-25 DIAGNOSIS — Z96659 Presence of unspecified artificial knee joint: Secondary | ICD-10-CM | POA: Diagnosis not present

## 2012-12-25 DIAGNOSIS — D638 Anemia in other chronic diseases classified elsewhere: Secondary | ICD-10-CM | POA: Diagnosis not present

## 2012-12-25 DIAGNOSIS — G8918 Other acute postprocedural pain: Secondary | ICD-10-CM | POA: Diagnosis not present

## 2013-01-02 DIAGNOSIS — Z96659 Presence of unspecified artificial knee joint: Secondary | ICD-10-CM | POA: Diagnosis not present

## 2013-01-06 DIAGNOSIS — F329 Major depressive disorder, single episode, unspecified: Secondary | ICD-10-CM | POA: Diagnosis not present

## 2013-01-06 DIAGNOSIS — E785 Hyperlipidemia, unspecified: Secondary | ICD-10-CM | POA: Diagnosis not present

## 2013-01-06 DIAGNOSIS — I10 Essential (primary) hypertension: Secondary | ICD-10-CM | POA: Diagnosis not present

## 2013-01-06 DIAGNOSIS — Z96659 Presence of unspecified artificial knee joint: Secondary | ICD-10-CM | POA: Diagnosis not present

## 2013-01-06 DIAGNOSIS — Z471 Aftercare following joint replacement surgery: Secondary | ICD-10-CM | POA: Diagnosis not present

## 2013-01-07 DIAGNOSIS — Z96659 Presence of unspecified artificial knee joint: Secondary | ICD-10-CM | POA: Diagnosis not present

## 2013-01-07 DIAGNOSIS — E785 Hyperlipidemia, unspecified: Secondary | ICD-10-CM | POA: Diagnosis not present

## 2013-01-07 DIAGNOSIS — Z471 Aftercare following joint replacement surgery: Secondary | ICD-10-CM | POA: Diagnosis not present

## 2013-01-07 DIAGNOSIS — I10 Essential (primary) hypertension: Secondary | ICD-10-CM | POA: Diagnosis not present

## 2013-01-07 DIAGNOSIS — F329 Major depressive disorder, single episode, unspecified: Secondary | ICD-10-CM | POA: Diagnosis not present

## 2013-01-08 DIAGNOSIS — Z96659 Presence of unspecified artificial knee joint: Secondary | ICD-10-CM | POA: Diagnosis not present

## 2013-01-08 DIAGNOSIS — E785 Hyperlipidemia, unspecified: Secondary | ICD-10-CM | POA: Diagnosis not present

## 2013-01-08 DIAGNOSIS — I10 Essential (primary) hypertension: Secondary | ICD-10-CM | POA: Diagnosis not present

## 2013-01-08 DIAGNOSIS — F329 Major depressive disorder, single episode, unspecified: Secondary | ICD-10-CM | POA: Diagnosis not present

## 2013-01-08 DIAGNOSIS — Z471 Aftercare following joint replacement surgery: Secondary | ICD-10-CM | POA: Diagnosis not present

## 2013-01-09 DIAGNOSIS — F329 Major depressive disorder, single episode, unspecified: Secondary | ICD-10-CM | POA: Diagnosis not present

## 2013-01-09 DIAGNOSIS — Z96659 Presence of unspecified artificial knee joint: Secondary | ICD-10-CM | POA: Diagnosis not present

## 2013-01-09 DIAGNOSIS — Z471 Aftercare following joint replacement surgery: Secondary | ICD-10-CM | POA: Diagnosis not present

## 2013-01-09 DIAGNOSIS — I10 Essential (primary) hypertension: Secondary | ICD-10-CM | POA: Diagnosis not present

## 2013-01-09 DIAGNOSIS — E785 Hyperlipidemia, unspecified: Secondary | ICD-10-CM | POA: Diagnosis not present

## 2013-01-10 DIAGNOSIS — Z471 Aftercare following joint replacement surgery: Secondary | ICD-10-CM | POA: Diagnosis not present

## 2013-01-10 DIAGNOSIS — E785 Hyperlipidemia, unspecified: Secondary | ICD-10-CM | POA: Diagnosis not present

## 2013-01-10 DIAGNOSIS — I10 Essential (primary) hypertension: Secondary | ICD-10-CM | POA: Diagnosis not present

## 2013-01-10 DIAGNOSIS — F329 Major depressive disorder, single episode, unspecified: Secondary | ICD-10-CM | POA: Diagnosis not present

## 2013-01-10 DIAGNOSIS — Z96659 Presence of unspecified artificial knee joint: Secondary | ICD-10-CM | POA: Diagnosis not present

## 2013-01-11 DIAGNOSIS — Z96659 Presence of unspecified artificial knee joint: Secondary | ICD-10-CM | POA: Diagnosis not present

## 2013-01-11 DIAGNOSIS — E785 Hyperlipidemia, unspecified: Secondary | ICD-10-CM | POA: Diagnosis not present

## 2013-01-11 DIAGNOSIS — Z471 Aftercare following joint replacement surgery: Secondary | ICD-10-CM | POA: Diagnosis not present

## 2013-01-11 DIAGNOSIS — F329 Major depressive disorder, single episode, unspecified: Secondary | ICD-10-CM | POA: Diagnosis not present

## 2013-01-11 DIAGNOSIS — I10 Essential (primary) hypertension: Secondary | ICD-10-CM | POA: Diagnosis not present

## 2013-01-14 DIAGNOSIS — F329 Major depressive disorder, single episode, unspecified: Secondary | ICD-10-CM | POA: Diagnosis not present

## 2013-01-14 DIAGNOSIS — I10 Essential (primary) hypertension: Secondary | ICD-10-CM | POA: Diagnosis not present

## 2013-01-14 DIAGNOSIS — E785 Hyperlipidemia, unspecified: Secondary | ICD-10-CM | POA: Diagnosis not present

## 2013-01-14 DIAGNOSIS — Z96659 Presence of unspecified artificial knee joint: Secondary | ICD-10-CM | POA: Diagnosis not present

## 2013-01-14 DIAGNOSIS — Z471 Aftercare following joint replacement surgery: Secondary | ICD-10-CM | POA: Diagnosis not present

## 2013-01-16 DIAGNOSIS — Z471 Aftercare following joint replacement surgery: Secondary | ICD-10-CM | POA: Diagnosis not present

## 2013-01-16 DIAGNOSIS — F329 Major depressive disorder, single episode, unspecified: Secondary | ICD-10-CM | POA: Diagnosis not present

## 2013-01-16 DIAGNOSIS — Z96659 Presence of unspecified artificial knee joint: Secondary | ICD-10-CM | POA: Diagnosis not present

## 2013-01-16 DIAGNOSIS — I10 Essential (primary) hypertension: Secondary | ICD-10-CM | POA: Diagnosis not present

## 2013-01-16 DIAGNOSIS — E785 Hyperlipidemia, unspecified: Secondary | ICD-10-CM | POA: Diagnosis not present

## 2013-01-17 DIAGNOSIS — Z96659 Presence of unspecified artificial knee joint: Secondary | ICD-10-CM | POA: Diagnosis not present

## 2013-01-17 DIAGNOSIS — Z471 Aftercare following joint replacement surgery: Secondary | ICD-10-CM | POA: Diagnosis not present

## 2013-01-17 DIAGNOSIS — E785 Hyperlipidemia, unspecified: Secondary | ICD-10-CM | POA: Diagnosis not present

## 2013-01-17 DIAGNOSIS — I10 Essential (primary) hypertension: Secondary | ICD-10-CM | POA: Diagnosis not present

## 2013-01-17 DIAGNOSIS — F329 Major depressive disorder, single episode, unspecified: Secondary | ICD-10-CM | POA: Diagnosis not present

## 2013-01-27 ENCOUNTER — Other Ambulatory Visit: Payer: Self-pay | Admitting: *Deleted

## 2013-01-27 DIAGNOSIS — Z1211 Encounter for screening for malignant neoplasm of colon: Secondary | ICD-10-CM

## 2013-01-27 DIAGNOSIS — Z79899 Other long term (current) drug therapy: Secondary | ICD-10-CM

## 2013-01-30 DIAGNOSIS — Z96659 Presence of unspecified artificial knee joint: Secondary | ICD-10-CM | POA: Diagnosis not present

## 2013-01-30 DIAGNOSIS — IMO0002 Reserved for concepts with insufficient information to code with codable children: Secondary | ICD-10-CM | POA: Diagnosis not present

## 2013-01-30 DIAGNOSIS — M1710 Unilateral primary osteoarthritis, unspecified knee: Secondary | ICD-10-CM | POA: Diagnosis not present

## 2013-02-12 ENCOUNTER — Encounter: Payer: Self-pay | Admitting: Cardiology

## 2013-02-13 ENCOUNTER — Encounter (INDEPENDENT_AMBULATORY_CARE_PROVIDER_SITE_OTHER): Payer: Self-pay

## 2013-02-13 ENCOUNTER — Encounter: Payer: Self-pay | Admitting: Cardiology

## 2013-02-13 ENCOUNTER — Ambulatory Visit (INDEPENDENT_AMBULATORY_CARE_PROVIDER_SITE_OTHER): Payer: Medicare Other | Admitting: Cardiology

## 2013-02-13 VITALS — BP 145/77 | HR 65 | Ht 64.0 in | Wt 205.0 lb

## 2013-02-13 DIAGNOSIS — I519 Heart disease, unspecified: Secondary | ICD-10-CM

## 2013-02-13 DIAGNOSIS — I5189 Other ill-defined heart diseases: Secondary | ICD-10-CM | POA: Insufficient documentation

## 2013-02-13 DIAGNOSIS — G2581 Restless legs syndrome: Secondary | ICD-10-CM | POA: Insufficient documentation

## 2013-02-13 DIAGNOSIS — I10 Essential (primary) hypertension: Secondary | ICD-10-CM | POA: Insufficient documentation

## 2013-02-13 DIAGNOSIS — G4700 Insomnia, unspecified: Secondary | ICD-10-CM | POA: Insufficient documentation

## 2013-02-13 DIAGNOSIS — I2510 Atherosclerotic heart disease of native coronary artery without angina pectoris: Secondary | ICD-10-CM | POA: Diagnosis not present

## 2013-02-13 DIAGNOSIS — E785 Hyperlipidemia, unspecified: Secondary | ICD-10-CM | POA: Insufficient documentation

## 2013-02-13 MED ORDER — ZALEPLON 5 MG PO CAPS: 5.0000 mg | ORAL_CAPSULE | Freq: Every evening | ORAL | Status: DC | PRN

## 2013-02-13 NOTE — Progress Notes (Addendum)
[address] [place], [place]  [address] Phone: [phone] Fax:  [phone]  Date:  02/14/2013   ID:  [name], [name] 10/15/1945, MRN [id number]  PCP:  Thora Lance, MD  Cardiologist:  Armanda Magic, MD     History of Present Illness: [name] is a 67 y.o. female with a history of CAD, HTN and dyslipidemia presents today for followup.  She is doing well.  She denies any chest pain, SOB, DOE, LE edema, dizziness, palpitations or syncope.  Since I saw her last she has had a knee replacement.  She is having some problems with insomnia which is mainly getting to sleep.   Wt Readings from Last 3 Encounters:  02/13/13 205 lb (92.987 kg)  12/21/12 215 lb 9.8 oz (97.8 kg)  12/21/12 215 lb 9.8 oz (97.8 kg)     Past Medical History  Diagnosis Date  . Coronary stent occlusion   . Arthritis   . Heart disease   . Depression   . GERD (gastroesophageal reflux disease)   . Coronary artery disease     s/p PCI of LAD/RCA, cath 2007 with 30% mid LAD, 20% OM1, 70% distal left circ too small for PCI, 90% acute RV mariginal too small for PCI  . Hypertension   . Hyperlipemia   . Diastolic dysfunction   . RLS (restless legs syndrome)     Current Outpatient Prescriptions  Medication Sig Dispense Refill  . aspirin EC 81 MG tablet Take 81 mg by mouth every evening.       . Calcium-Magnesium-Vitamin D (CALCIUM 1200+D3 PO) Take 1 capsule by mouth daily.      . cholecalciferol (VITAMIN D) 1000 UNITS tablet Take 1,000 Units by mouth daily.      . clopidogrel (PLAVIX) 75 MG tablet Take 75 mg by mouth every evening.       Marland Kitchen esomeprazole (NEXIUM) 20 MG capsule Take 20 mg by mouth daily before breakfast.      . ezetimibe (ZETIA) 10 MG tablet Take 10 mg by mouth every evening.       . Flaxseed, Linseed, (FLAX SEED OIL PO) Take 2,400 mg by mouth daily.       . isosorbide mononitrate (IMDUR) 60 MG 24 hr tablet Take 60 mg by mouth daily.      . metoprolol succinate  (TOPROL-XL) 25 MG 24 hr tablet Take 25 mg by mouth daily.      Marland Kitchen OVER THE COUNTER MEDICATION Take 1 capsule by mouth 2 (two) times daily. Omega XL  (Fish Oil Supplement)      . oxyCODONE-acetaminophen (ROXICET) 5-325 MG per tablet Take 1 tablet by mouth every 4 (four) hours as needed for pain.  30 tablet  0  . rOPINIRole (REQUIP) 0.5 MG tablet Take 1-2 mg by mouth 2 (two) times daily as needed. For restless leg syndrome      . rosuvastatin (CRESTOR) 10 MG tablet Take 10 mg by mouth daily.      . sertraline (ZOLOFT) 50 MG tablet Take 50 mg by mouth daily.      . zaleplon (SONATA) 5 MG capsule Take 1 capsule (5 mg total) by mouth at bedtime as needed for sleep (Pt was given a written rx from Dr Mayford Knife).       No current facility-administered medications for this visit.    Allergies:    Allergies  Allergen Reactions  . Lipitor [Atorvastatin]     "does not like to take"  .  Prednisone Other (See Comments)    Can't sleep  . Zocor [Simvastatin]     Social History:  The patient  reports that she has never smoked. She has never used smokeless tobacco. She reports that she does not drink alcohol or use illicit drugs.   Family History:  The patient's family history includes Heart Problems in her father.   ROS:  Please see the history of present illness.      All other systems reviewed and negative.   PHYSICAL EXAM: VS:  BP 145/77  Pulse 65  Ht 5\' 4"  (1.626 m)  Wt 205 lb (92.987 kg)  BMI 35.17 kg/m2 Well nourished, well developed, in no acute distress HEENT: normal Neck: no JVD Cardiac:  normal S1, S2; RRR; no murmur Lungs:  clear to auscultation bilaterally, no wheezing, rhonchi or rales Abd: soft, nontender, no hepatomegaly Ext: no edema Skin: warm and dry Neuro:  CNs 2-12 intact, no focal abnormalities noted  EKG:  NSR with no ST changes     ASSESSMENT AND PLAN:  1. ASCAD  -continue ASA/Plavix/Imdur 2. HTN - controlled  - continue Toprol 3. Dyslipidemia - at goal  -  continue Zetia/fish oil/Crestor/Flaxseed oil  - recheck ALT and fasting statin 4.  Sleep onset insomnia   -  I have given her a prescription for Sonata 5mg  Qhs PRN Followup with me in 6 months  Signed, Armanda Magic, MD 02/14/2013 10:03 PM

## 2013-02-13 NOTE — Patient Instructions (Signed)
Your physician has recommended you make the following change in your medication: Dr. Mayford Knife Wrote you a rx for Sonata 5 MG tablets PRN For Sleep  Your physician recommends that you return for lab work next Monday 02/18/13 for Fasting Lipid and ALT.   Your physician wants you to follow-up in: 6 Months with Dr. Sherlyn Lick will receive a reminder letter in the mail two months in advance. If you don't receive a letter, please call our office to schedule the follow-up appointment.

## 2013-02-18 ENCOUNTER — Encounter: Payer: Self-pay | Admitting: General Surgery

## 2013-02-18 ENCOUNTER — Other Ambulatory Visit (INDEPENDENT_AMBULATORY_CARE_PROVIDER_SITE_OTHER): Payer: Medicare Other

## 2013-02-18 ENCOUNTER — Other Ambulatory Visit: Payer: Self-pay | Admitting: General Surgery

## 2013-02-18 DIAGNOSIS — E785 Hyperlipidemia, unspecified: Secondary | ICD-10-CM | POA: Diagnosis not present

## 2013-02-18 DIAGNOSIS — Z79899 Other long term (current) drug therapy: Secondary | ICD-10-CM

## 2013-02-18 LAB — LIPID PANEL
Cholesterol: 132 mg/dL (ref 0–200)
HDL: 57.7 mg/dL (ref >39.00)
LDL Cholesterol: 51 mg/dL (ref 0–99)
Total CHOL/HDL Ratio: 2
Triglycerides: 117 mg/dL (ref 0.0–149.0)
VLDL: 23.4 mg/dL (ref 0.0–40.0)

## 2013-02-18 LAB — ALT: ALT: 16 U/L (ref 0–35)

## 2013-03-04 ENCOUNTER — Other Ambulatory Visit: Payer: Medicare Other

## 2013-03-13 DIAGNOSIS — M1710 Unilateral primary osteoarthritis, unspecified knee: Secondary | ICD-10-CM | POA: Diagnosis not present

## 2013-03-13 DIAGNOSIS — IMO0002 Reserved for concepts with insufficient information to code with codable children: Secondary | ICD-10-CM | POA: Diagnosis not present

## 2013-03-26 DIAGNOSIS — Z23 Encounter for immunization: Secondary | ICD-10-CM | POA: Diagnosis not present

## 2013-03-26 DIAGNOSIS — Z1211 Encounter for screening for malignant neoplasm of colon: Secondary | ICD-10-CM | POA: Diagnosis not present

## 2013-04-23 IMAGING — CR DG CHEST 2V
2 series · 2 of 2 positions shown · non-contrast
Comparison: 02/20/2007.

CLINICAL DATA: Slurred speech.  Left arm and leg numbness.
Hypertension.

CHEST - 2 VIEW

[w chest pa]
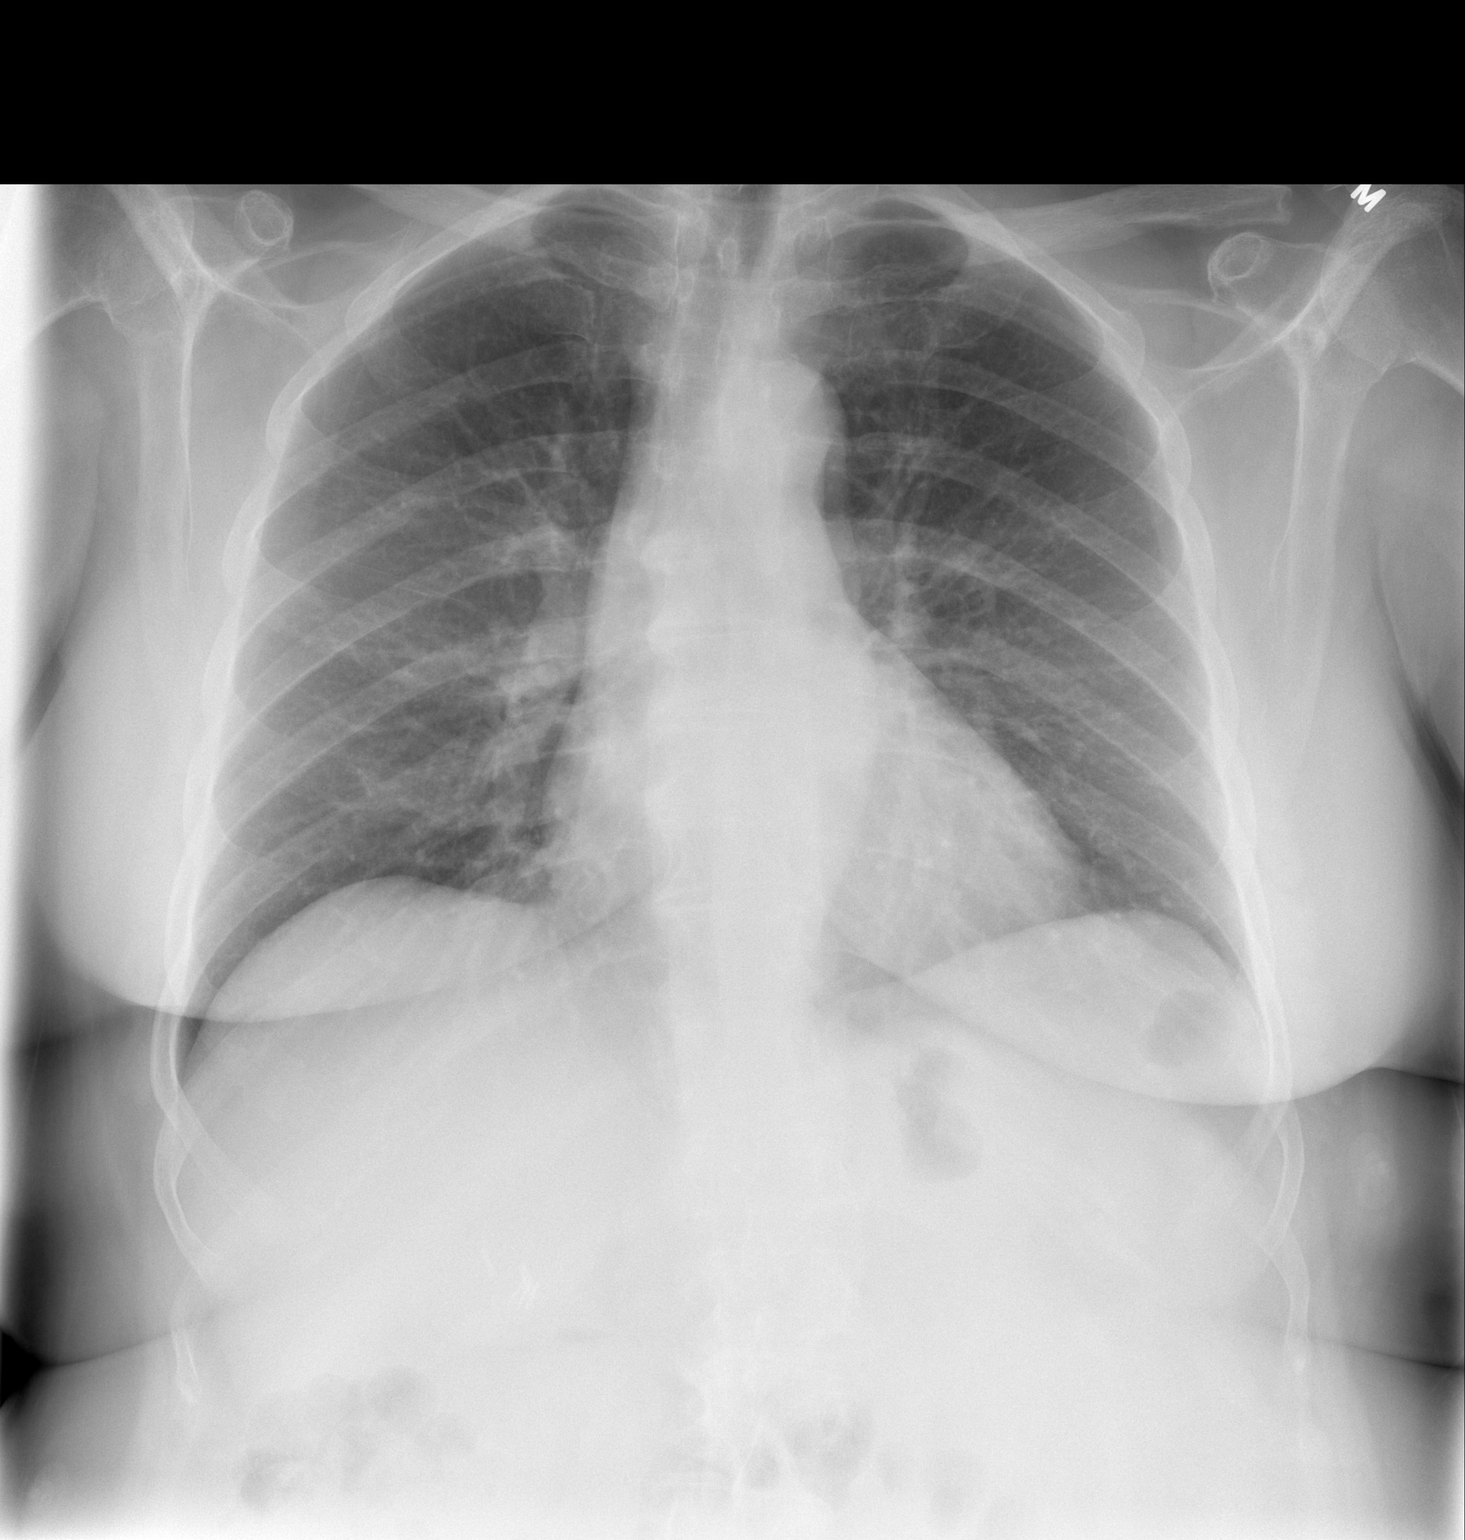

[w chest lat]
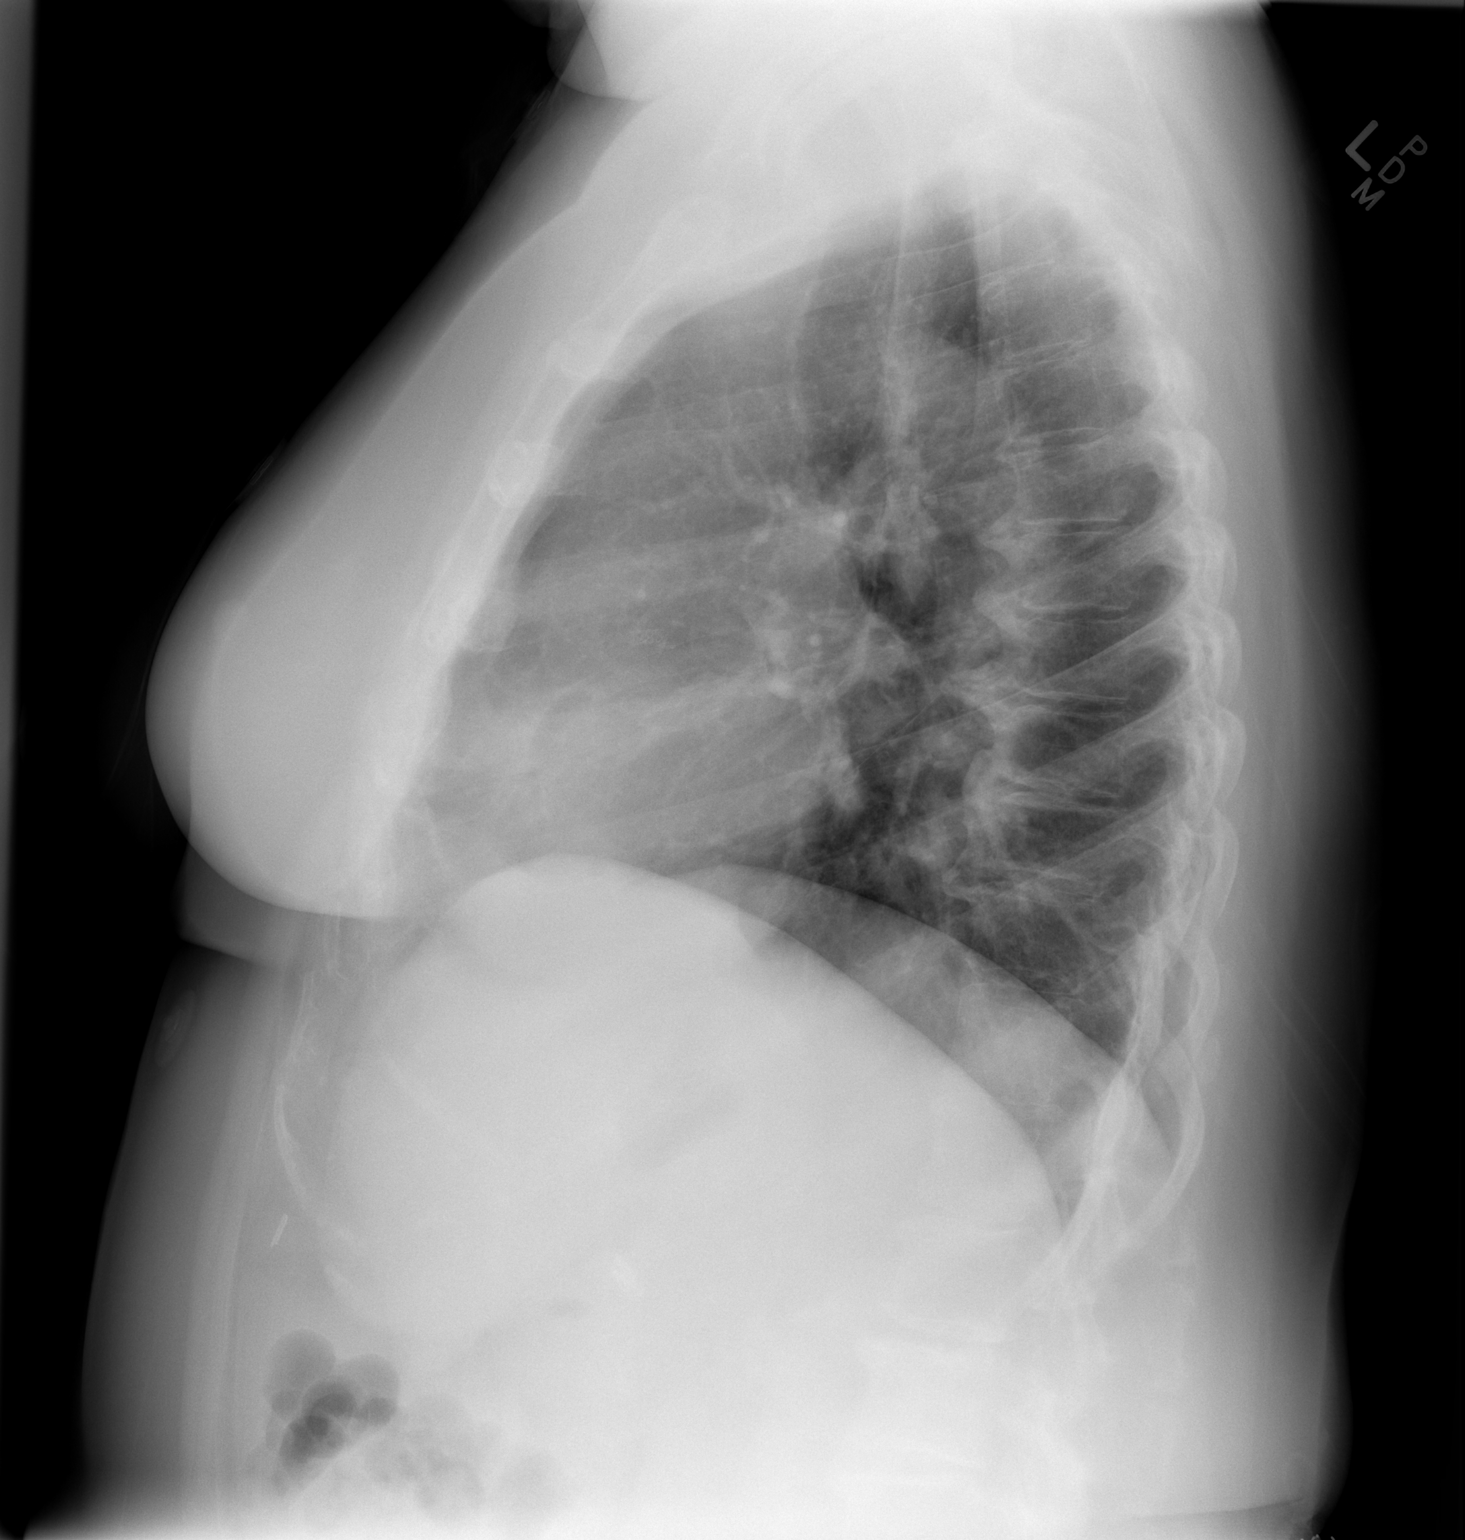

[2 of 2 positions shown; findings below may reference images not displayed]

FINDINGS: Normal sized heart.  Clear lungs with normal vascularity.
Thoracic spine degenerative changes and mild scoliosis.
Cholecystectomy clips.
IMPRESSION: No acute abnormality.

## 2013-05-06 ENCOUNTER — Other Ambulatory Visit: Payer: Self-pay

## 2013-05-06 DIAGNOSIS — Z1231 Encounter for screening mammogram for malignant neoplasm of breast: Secondary | ICD-10-CM

## 2013-06-12 ENCOUNTER — Ambulatory Visit
Admission: RE | Admit: 2013-06-12 | Discharge: 2013-06-12 | Disposition: A | Discharge status: Home / Self Care | Payer: Medicare Other | Source: Ambulatory Visit

## 2013-06-12 DIAGNOSIS — Z1231 Encounter for screening mammogram for malignant neoplasm of breast: Secondary | ICD-10-CM | POA: Diagnosis not present

## 2013-08-20 ENCOUNTER — Other Ambulatory Visit: Payer: Medicare Other

## 2013-08-28 ENCOUNTER — Ambulatory Visit: Payer: Medicare Other | Admitting: Diagnostic Neuroimaging

## 2013-08-29 ENCOUNTER — Ambulatory Visit: Payer: Medicare Other | Admitting: Diagnostic Neuroimaging

## 2013-10-11 DIAGNOSIS — G2589 Other specified extrapyramidal and movement disorders: Secondary | ICD-10-CM | POA: Diagnosis not present

## 2013-10-11 DIAGNOSIS — E7800 Pure hypercholesterolemia, unspecified: Secondary | ICD-10-CM | POA: Diagnosis not present

## 2013-10-11 DIAGNOSIS — Z1331 Encounter for screening for depression: Secondary | ICD-10-CM | POA: Diagnosis not present

## 2013-10-11 DIAGNOSIS — I2510 Atherosclerotic heart disease of native coronary artery without angina pectoris: Secondary | ICD-10-CM | POA: Diagnosis not present

## 2013-10-11 DIAGNOSIS — Z1211 Encounter for screening for malignant neoplasm of colon: Secondary | ICD-10-CM | POA: Diagnosis not present

## 2013-10-11 DIAGNOSIS — I10 Essential (primary) hypertension: Secondary | ICD-10-CM | POA: Diagnosis not present

## 2013-10-11 DIAGNOSIS — F329 Major depressive disorder, single episode, unspecified: Secondary | ICD-10-CM | POA: Diagnosis not present

## 2013-11-04 DIAGNOSIS — Z1211 Encounter for screening for malignant neoplasm of colon: Secondary | ICD-10-CM | POA: Diagnosis not present

## 2014-01-22 DIAGNOSIS — Z23 Encounter for immunization: Secondary | ICD-10-CM | POA: Diagnosis not present

## 2014-03-12 IMAGING — CR DG KNEE 1-2V PORT*L*
2 series · 2 of 2 positions shown · non-contrast
Comparison: None.

CLINICAL DATA: Left total knee replacement.

EXAM:
PORTABLE LEFT KNEE - 1-2 VIEW

[AP]
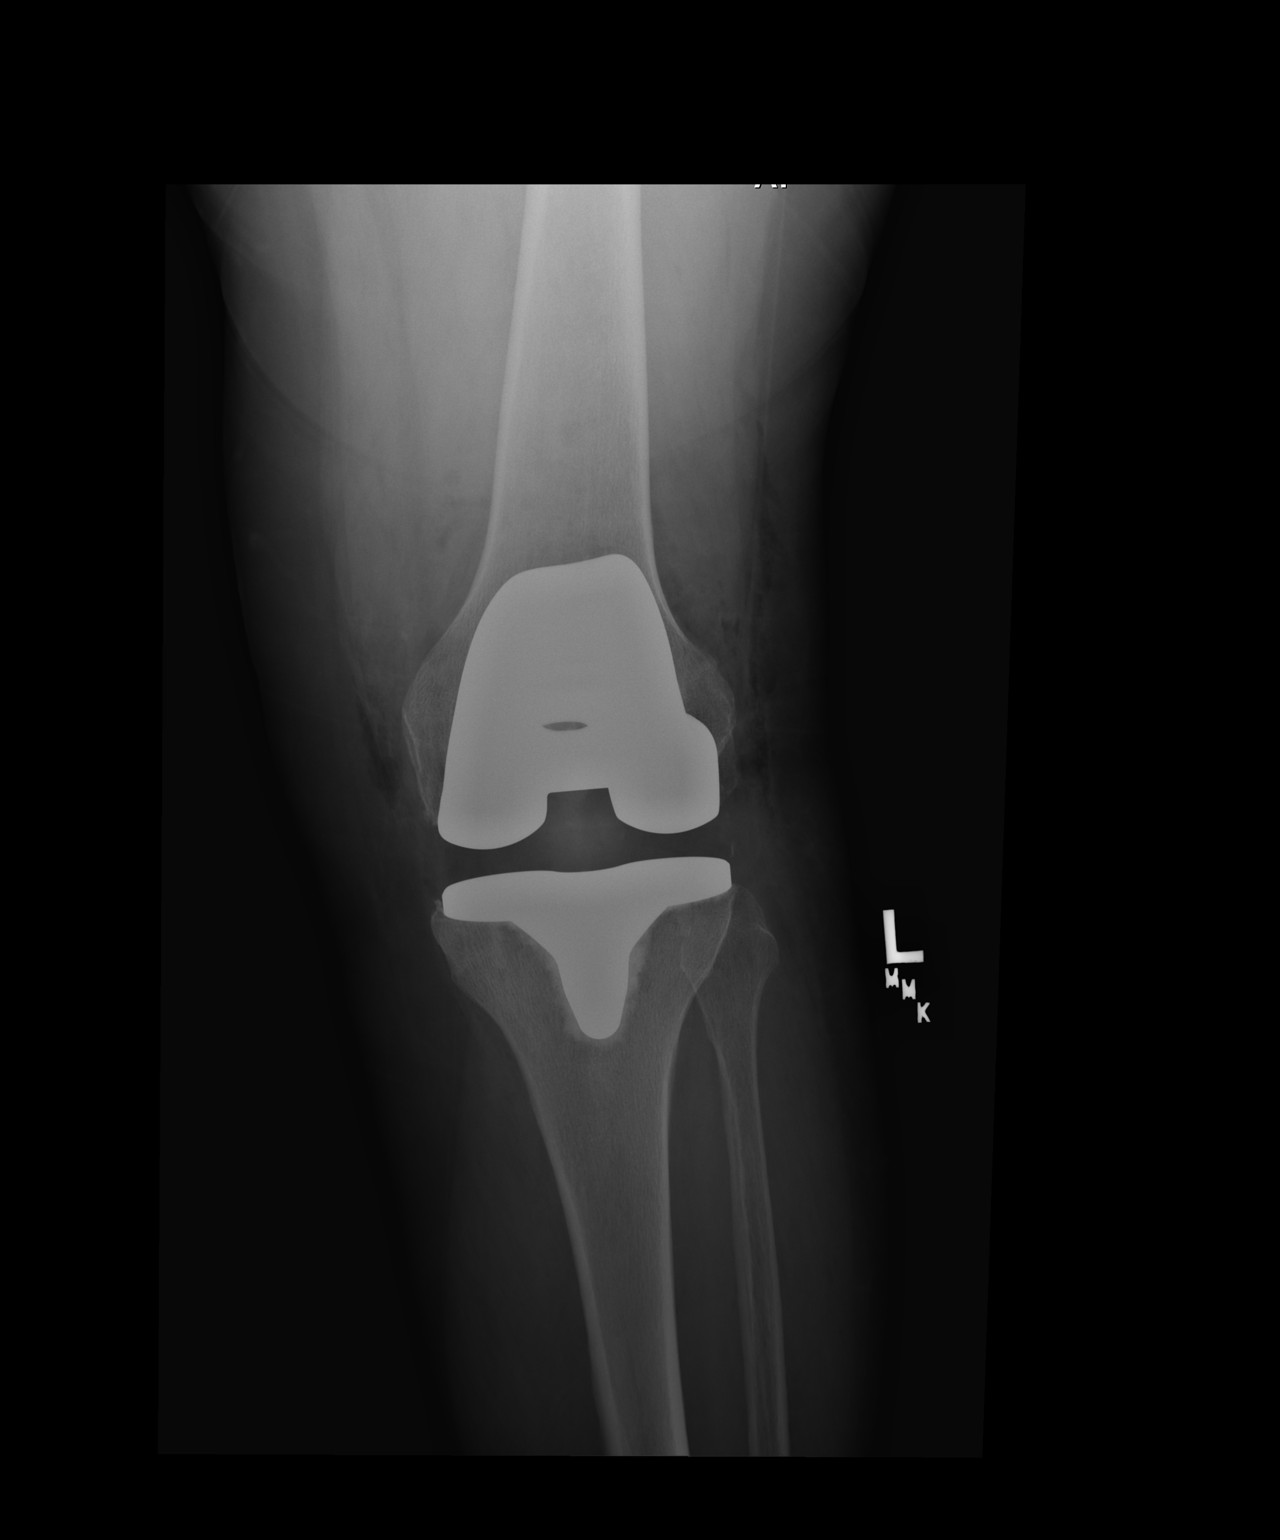

[xtable lateral]
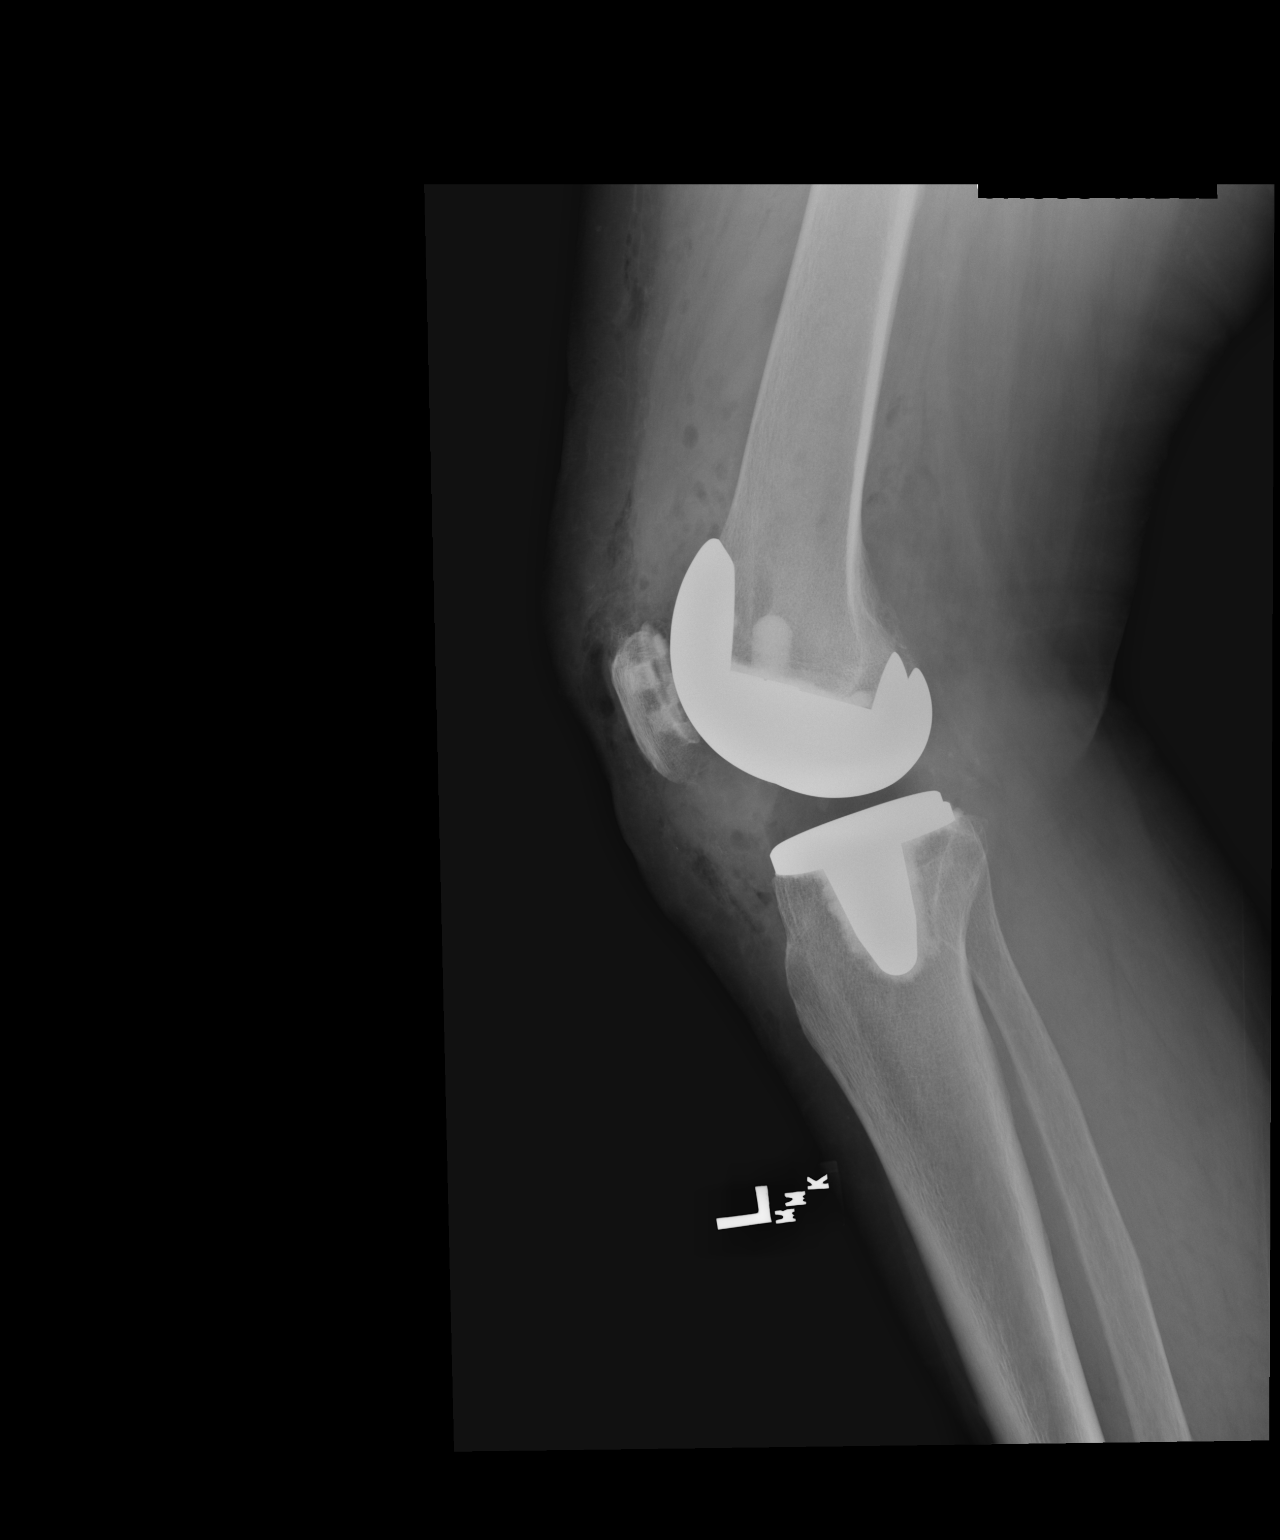

[2 of 2 positions shown; findings below may reference images not displayed]

FINDINGS: A total knee prosthesis is in place. Expected gas in adjacent soft
tissues. No fracture or complicating feature is radiographically
apparent.
IMPRESSION: 1. Left total knee prosthesis placement, without complicating
feature observed.

## 2014-05-21 DIAGNOSIS — M25512 Pain in left shoulder: Secondary | ICD-10-CM | POA: Diagnosis not present

## 2014-06-19 ENCOUNTER — Other Ambulatory Visit: Payer: Self-pay

## 2014-06-19 DIAGNOSIS — Z9289 Personal history of other medical treatment: Secondary | ICD-10-CM

## 2014-06-19 DIAGNOSIS — Z1231 Encounter for screening mammogram for malignant neoplasm of breast: Secondary | ICD-10-CM

## 2014-06-19 DIAGNOSIS — Z139 Encounter for screening, unspecified: Secondary | ICD-10-CM

## 2014-07-02 ENCOUNTER — Ambulatory Visit
Admission: RE | Admit: 2014-07-02 | Discharge: 2014-07-02 | Disposition: A | Discharge status: Home / Self Care | Payer: Medicare Other | Source: Ambulatory Visit

## 2014-07-02 DIAGNOSIS — Z1231 Encounter for screening mammogram for malignant neoplasm of breast: Secondary | ICD-10-CM | POA: Diagnosis not present

## 2014-07-04 DIAGNOSIS — M25572 Pain in left ankle and joints of left foot: Secondary | ICD-10-CM | POA: Diagnosis not present

## 2014-07-04 DIAGNOSIS — L853 Xerosis cutis: Secondary | ICD-10-CM | POA: Diagnosis not present

## 2014-08-12 DIAGNOSIS — Z79899 Other long term (current) drug therapy: Secondary | ICD-10-CM | POA: Diagnosis not present

## 2014-08-12 DIAGNOSIS — E780 Pure hypercholesterolemia: Secondary | ICD-10-CM | POA: Diagnosis not present

## 2014-10-20 DIAGNOSIS — R7301 Impaired fasting glucose: Secondary | ICD-10-CM | POA: Diagnosis not present

## 2014-10-20 DIAGNOSIS — F39 Unspecified mood [affective] disorder: Secondary | ICD-10-CM | POA: Diagnosis not present

## 2014-10-20 DIAGNOSIS — K219 Gastro-esophageal reflux disease without esophagitis: Secondary | ICD-10-CM | POA: Diagnosis not present

## 2014-10-20 DIAGNOSIS — G2581 Restless legs syndrome: Secondary | ICD-10-CM | POA: Diagnosis not present

## 2014-10-20 DIAGNOSIS — Z23 Encounter for immunization: Secondary | ICD-10-CM | POA: Diagnosis not present

## 2014-10-20 DIAGNOSIS — I2510 Atherosclerotic heart disease of native coronary artery without angina pectoris: Secondary | ICD-10-CM | POA: Diagnosis not present

## 2014-10-20 DIAGNOSIS — I10 Essential (primary) hypertension: Secondary | ICD-10-CM | POA: Diagnosis not present

## 2014-10-20 DIAGNOSIS — E780 Pure hypercholesterolemia: Secondary | ICD-10-CM | POA: Diagnosis not present

## 2014-10-20 DIAGNOSIS — Z1389 Encounter for screening for other disorder: Secondary | ICD-10-CM | POA: Diagnosis not present

## 2014-10-20 DIAGNOSIS — F322 Major depressive disorder, single episode, severe without psychotic features: Secondary | ICD-10-CM | POA: Diagnosis not present

## 2014-10-20 DIAGNOSIS — F411 Generalized anxiety disorder: Secondary | ICD-10-CM | POA: Diagnosis not present

## 2014-10-22 ENCOUNTER — Ambulatory Visit (INDEPENDENT_AMBULATORY_CARE_PROVIDER_SITE_OTHER): Payer: Medicare Other | Admitting: Cardiology

## 2014-10-22 ENCOUNTER — Encounter: Payer: Self-pay | Admitting: Cardiology

## 2014-10-22 VITALS — BP 120/70 | HR 49 | Ht 64.0 in | Wt 198.6 lb

## 2014-10-22 DIAGNOSIS — I2510 Atherosclerotic heart disease of native coronary artery without angina pectoris: Secondary | ICD-10-CM

## 2014-10-22 DIAGNOSIS — I10 Essential (primary) hypertension: Secondary | ICD-10-CM

## 2014-10-22 DIAGNOSIS — I2583 Coronary atherosclerosis due to lipid rich plaque: Principal | ICD-10-CM

## 2014-10-22 DIAGNOSIS — R011 Cardiac murmur, unspecified: Secondary | ICD-10-CM

## 2014-10-22 DIAGNOSIS — E785 Hyperlipidemia, unspecified: Secondary | ICD-10-CM | POA: Diagnosis not present

## 2014-10-22 MED ORDER — METOPROLOL SUCCINATE ER 25 MG PO TB24: 12.5000 mg | ORAL_TABLET | Freq: Every day | ORAL | Status: DC

## 2014-10-22 NOTE — Progress Notes (Addendum)
Cardiology Office Note   Date:  10/22/2014   ID:  Kristi, Young 13-Apr-1945, MRN [id number]  PCP:  Simona Huh, MD    Chief Complaint  Patient presents with  . Follow-up    CAD      History of Present Illness: [name] is a 69y.o. female with a history of CAD, HTN and dyslipidemia presents today for followup. She is doing well. She denies any chest pain, SOB, DOE, LE edema, dizziness, palpitations or syncope.  Past Medical History  Diagnosis Date  . Coronary stent occlusion   . Arthritis   . Heart disease   . Depression   . GERD (gastroesophageal reflux disease)   . Coronary artery disease     s/p PCI of LAD/RCA, cath 2007 with 30% mid LAD, 20% OM1, 70% distal left circ too small for PCI, 90% acute RV mariginal too small for PCI  . Hypertension   . Hyperlipemia   . Diastolic dysfunction   . RLS (restless legs syndrome)     Past Surgical History  Procedure Laterality Date  . Rotator cuff repair Left     shoulder  . Cholecystectomy    . Lumbar disc surgery    . Tendon repair Right     ankle  . Coronary stent placement      x2  . Cardiac catheterization    . 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 aspirin EC 81 MG tablet Take 81 mg by mouth every evening.     . Calcium Carbonate-Vitamin D (CALCIUM 600+D) 600-400 MG-UNIT per tablet Take 1 tablet by mouth daily.    . cholecalciferol (VITAMIN D) 1000 UNITS tablet Take 1,000 Units by mouth daily.    . clopidogrel (PLAVIX) 75 MG tablet Take 75 mg by mouth every evening.     Marland Kitchen esomeprazole (NEXIUM) 20 MG capsule Take 20 mg by mouth daily before breakfast.    . ezetimibe (ZETIA) 10 MG tablet Take 10 mg by mouth  every evening.     . isosorbide mononitrate (IMDUR) 60 MG 24 hr tablet Take 60 mg by mouth daily.    . metoprolol succinate (TOPROL-XL) 25 MG 24 hr tablet Take 25 mg by mouth daily.    . Multiple Vitamins-Minerals (MULTIVITAMIN ADULT PO) Take 1 tablet by mouth daily.    . Omega-3 Fatty Acids (FISH OIL PO) Take 1,000 mg by mouth daily.    Marland Kitchen oxyCODONE-acetaminophen (ROXICET) 5-325 MG per tablet Take 1 tablet by mouth every 4 (four) hours as needed for pain. 30 tablet 0  . pravastatin (PRAVACHOL) 80 MG tablet Take 80 mg by mouth daily.    Marland Kitchen rOPINIRole (REQUIP) 0.5 MG tablet Take 1-2 mg by mouth 2 (two) times daily as needed. For restless leg syndrome    . sertraline (ZOLOFT) 50 MG tablet Take 50 mg by mouth daily.    . zaleplon (SONATA) 5 MG capsule Take 1 capsule (5 mg total) by mouth at bedtime as needed for sleep (Pt was given a written rx from Dr Radford Pax).     No current facility-administered medications for this visit.  Allergies:   Lipitor; Prednisone; and Zocor    Social History:  The patient  reports that she has never smoked. She has never used smokeless tobacco. She reports that she does not drink alcohol or use illicit drugs.   Family History:  The patient's family history includes Heart Problems in her father.    ROS:  Please see the history of present illness.   Otherwise, review of systems are positive for none.   All other systems are reviewed and negative.    PHYSICAL EXAM: VS:  BP 120/70 mmHg  Pulse 49  Ht 5\' 4"  (1.626 m)  Wt 198 lb 9.6 oz (90.084 kg)  BMI 34.07 kg/m2  SpO2 93% , BMI Body mass index is 34.07 kg/(m^2). GEN: Well nourished, well developed, in no acute distress HEENT: normal Neck: no JVD, carotid bruits, or masses Cardiac: RRR; no rubs, or gallops,no edema. 1/6 SM at RUSB to LLSB Respiratory:  clear to auscultation bilaterally, normal work of breathing GI: soft, nontender, nondistended, + BS MS: no deformity or atrophy Skin: warm and dry, no  rash Neuro:  Strength and sensation are intact Psych: euthymic mood, full affect   EKG:  EKG was ordered today. The ekg ordered today demonstrates sinus bradycardia at 49 bpm with no ST changes   Recent Labs: No results found for requested labs within last 365 days.    Lipid Panel    Component Value Date/Time   CHOL 132 02/18/2013 0740   TRIG 117.0 02/18/2013 0740   HDL 57.70 02/18/2013 0740   CHOLHDL 2 02/18/2013 0740   VLDL 23.4 02/18/2013 0740   LDLCALC 51 02/18/2013 0740      Wt Readings from Last 3 Encounters:  10/22/14 198 lb 9.6 oz (90.084 kg)  02/13/13 205 lb (92.987 kg)  12/21/12 215 lb 9.8 oz (97.8 kg)    ASSESSMENT AND PLAN:  1. ASCAD -continue ASA/Plavix/Imdur 2. HTN - controlled - continue Toprol 3. Dyslipidemia - at goal - continue Zetia/fish oil/pravachol/Flaxseed oil - Her last LDL was 88 on lipids at PCP 7/25.  LDL goal is < 70.  Will refer to lipid clinic since she is on Zetia and pravachol 80mg  daily and not at goal   4.       Asymptomatic bradycardia - I have instructed her to decrease her Toprol 12.5mg  daily.  I will have her come in for BP check and EKG in 1 week.     5.        Heart murmur - check 2D echo  Current medicines are reviewed at length with the patient today.  The patient does not have concerns regarding medicines.  The following changes have been made:  no change  Labs/ tests ordered today: See above Assessment and Plan No orders of the defined types were placed in this encounter.     Disposition:   FU with me in 1 year  Signed, Sueanne Margarita, MD  10/22/2014 3:33 PM    [facility] [address], [place], [place]  [address] Phone: [phone]; Fax: [phone]

## 2014-10-22 NOTE — Patient Instructions (Addendum)
Medication Instructions:  Your physician has recommended you make the following change in your medication: 1) DECREASE TOPROL to 25 mg 1/2 tablet daily (12.5 mg)  Labwork: None  Testing/Procedures: Your physician has requested that you have an echocardiogram. Echocardiography is a painless test that uses sound waves to create images of your heart. It provides your doctor with information about the size and shape of your heart and how well your heart's chambers and valves are working. This procedure takes approximately one hour. There are no restrictions for this procedure.  Follow-Up: Your physician recommends that you schedule a follow-up appointment for an EKG with a nurse in 1 week.  Your physician wants you to follow-up in: 1 year with Dr. Radford Pax. You will receive a reminder letter in the mail two months in advance. If you don't receive a letter, please call our office to schedule the follow-up appointment.   Any Other Special Instructions Will Be Listed Below (If Applicable).

## 2014-10-29 ENCOUNTER — Ambulatory Visit (HOSPITAL_COMMUNITY): Payer: Medicare Other | Attending: Cardiology

## 2014-10-29 ENCOUNTER — Other Ambulatory Visit: Payer: Self-pay

## 2014-10-29 ENCOUNTER — Ambulatory Visit (INDEPENDENT_AMBULATORY_CARE_PROVIDER_SITE_OTHER): Payer: Medicare Other

## 2014-10-29 VITALS — BP 140/68 | HR 51 | Ht 64.0 in | Wt 198.1 lb

## 2014-10-29 DIAGNOSIS — I340 Nonrheumatic mitral (valve) insufficiency: Secondary | ICD-10-CM | POA: Diagnosis not present

## 2014-10-29 DIAGNOSIS — R011 Cardiac murmur, unspecified: Secondary | ICD-10-CM

## 2014-10-29 DIAGNOSIS — E785 Hyperlipidemia, unspecified: Secondary | ICD-10-CM | POA: Diagnosis not present

## 2014-10-29 DIAGNOSIS — R001 Bradycardia, unspecified: Secondary | ICD-10-CM | POA: Diagnosis not present

## 2014-10-29 DIAGNOSIS — I371 Nonrheumatic pulmonary valve insufficiency: Secondary | ICD-10-CM | POA: Insufficient documentation

## 2014-10-29 DIAGNOSIS — Z8249 Family history of ischemic heart disease and other diseases of the circulatory system: Secondary | ICD-10-CM | POA: Diagnosis not present

## 2014-10-29 DIAGNOSIS — I517 Cardiomegaly: Secondary | ICD-10-CM | POA: Insufficient documentation

## 2014-10-29 DIAGNOSIS — I071 Rheumatic tricuspid insufficiency: Secondary | ICD-10-CM | POA: Diagnosis not present

## 2014-10-29 DIAGNOSIS — I10 Essential (primary) hypertension: Secondary | ICD-10-CM | POA: Diagnosis not present

## 2014-10-29 NOTE — Patient Instructions (Signed)
Medication Instructions:  Your physician recommends that you continue on your current medications as directed. Please refer to the Current Medication list given to you today.   Labwork: NONE  Testing/Procedures: NONE  Follow-Up: Keep scheduled annual follow up with Dr. Radford Pax in 2017  Any Other Special Instructions Will Be Listed Below (If Applicable).

## 2014-10-29 NOTE — Progress Notes (Signed)
1.) Reason for visit: EKG and BP Check  2.) Name of MD requesting visit: T. Turner  3.) H&P: Pt her with change in medications from last week OV with Dr. Radford Pax  4.) ROS related to problem: During OV with Dr. Radford Pax last week pt Toprol XL was decreased from 25 mg to 12.5 mg daily.  Pt has been taking medication as ordered without problems. Pt has not c/o CP, dizziness, SOB, or lightheadedness.  Pt EKG showed to Flex, pt HR 51 and BP 140/68. No new orders for pt to continue current treatment.  Pt agrees with plan and no questions at this time.

## 2014-12-03 ENCOUNTER — Emergency Department (HOSPITAL_COMMUNITY)
Admission: EM | Admit: 2014-12-03 | Discharge: 2014-12-03 | Disposition: A | Discharge status: Home / Self Care | Payer: Medicare Other | Attending: Emergency Medicine | Admitting: Emergency Medicine

## 2014-12-03 ENCOUNTER — Emergency Department (HOSPITAL_COMMUNITY): Payer: Medicare Other

## 2014-12-03 ENCOUNTER — Encounter (HOSPITAL_COMMUNITY): Payer: Self-pay | Admitting: *Deleted

## 2014-12-03 DIAGNOSIS — E785 Hyperlipidemia, unspecified: Secondary | ICD-10-CM | POA: Insufficient documentation

## 2014-12-03 DIAGNOSIS — Z7982 Long term (current) use of aspirin: Secondary | ICD-10-CM | POA: Insufficient documentation

## 2014-12-03 DIAGNOSIS — Z79899 Other long term (current) drug therapy: Secondary | ICD-10-CM | POA: Diagnosis not present

## 2014-12-03 DIAGNOSIS — K219 Gastro-esophageal reflux disease without esophagitis: Secondary | ICD-10-CM | POA: Insufficient documentation

## 2014-12-03 DIAGNOSIS — I10 Essential (primary) hypertension: Secondary | ICD-10-CM | POA: Diagnosis not present

## 2014-12-03 DIAGNOSIS — M7981 Nontraumatic hematoma of soft tissue: Secondary | ICD-10-CM | POA: Insufficient documentation

## 2014-12-03 DIAGNOSIS — F329 Major depressive disorder, single episode, unspecified: Secondary | ICD-10-CM | POA: Diagnosis not present

## 2014-12-03 DIAGNOSIS — Z8669 Personal history of other diseases of the nervous system and sense organs: Secondary | ICD-10-CM | POA: Diagnosis not present

## 2014-12-03 DIAGNOSIS — S60222A Contusion of left hand, initial encounter: Secondary | ICD-10-CM | POA: Diagnosis not present

## 2014-12-03 DIAGNOSIS — I2510 Atherosclerotic heart disease of native coronary artery without angina pectoris: Secondary | ICD-10-CM | POA: Diagnosis not present

## 2014-12-03 DIAGNOSIS — M79642 Pain in left hand: Secondary | ICD-10-CM | POA: Diagnosis present

## 2014-12-03 DIAGNOSIS — M1990 Unspecified osteoarthritis, unspecified site: Secondary | ICD-10-CM | POA: Diagnosis not present

## 2014-12-03 DIAGNOSIS — Z7902 Long term (current) use of antithrombotics/antiplatelets: Secondary | ICD-10-CM | POA: Insufficient documentation

## 2014-12-03 DIAGNOSIS — M25532 Pain in left wrist: Secondary | ICD-10-CM | POA: Diagnosis not present

## 2014-12-03 DIAGNOSIS — S60012A Contusion of left thumb without damage to nail, initial encounter: Secondary | ICD-10-CM | POA: Diagnosis not present
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
## 2014-12-03 NOTE — Discharge Instructions (Signed)
Wear the wrist splint for comfort, apply ice, elevate and follow up with Dr. Burney Gauze. Return here as needed.

## 2014-12-03 NOTE — ED Notes (Signed)
Pt reports having a bruise to her left hand. Pt noted to have bruising to base of left thumb. Pt able to move thumb. Sensation intact. Pt states that it has been there for 2 days and is tender to touch.

## 2014-12-11 DIAGNOSIS — M1812 Unilateral primary osteoarthritis of first carpometacarpal joint, left hand: Secondary | ICD-10-CM | POA: Diagnosis not present

## 2015-01-16 DIAGNOSIS — L89892 Pressure ulcer of other site, stage 2: Secondary | ICD-10-CM | POA: Diagnosis not present

## 2015-01-16 DIAGNOSIS — M79671 Pain in right foot: Secondary | ICD-10-CM | POA: Diagnosis not present

## 2015-01-16 DIAGNOSIS — M216X2 Other acquired deformities of left foot: Secondary | ICD-10-CM | POA: Diagnosis not present

## 2015-01-16 DIAGNOSIS — M216X1 Other acquired deformities of right foot: Secondary | ICD-10-CM | POA: Diagnosis not present

## 2015-01-16 DIAGNOSIS — M79672 Pain in left foot: Secondary | ICD-10-CM | POA: Diagnosis not present

## 2015-05-26 ENCOUNTER — Other Ambulatory Visit: Payer: Self-pay

## 2015-05-26 DIAGNOSIS — Z1231 Encounter for screening mammogram for malignant neoplasm of breast: Secondary | ICD-10-CM

## 2015-06-14 ENCOUNTER — Encounter (HOSPITAL_COMMUNITY): Payer: Self-pay | Admitting: *Deleted

## 2015-06-14 ENCOUNTER — Emergency Department (HOSPITAL_COMMUNITY)
Admission: EM | Admit: 2015-06-14 | Discharge: 2015-06-14 | Disposition: A | Discharge status: Home / Self Care | Payer: Medicare Other | Attending: Emergency Medicine | Admitting: Emergency Medicine

## 2015-06-14 DIAGNOSIS — Z9861 Coronary angioplasty status: Secondary | ICD-10-CM | POA: Diagnosis not present

## 2015-06-14 DIAGNOSIS — I10 Essential (primary) hypertension: Secondary | ICD-10-CM | POA: Insufficient documentation

## 2015-06-14 DIAGNOSIS — K219 Gastro-esophageal reflux disease without esophagitis: Secondary | ICD-10-CM | POA: Insufficient documentation

## 2015-06-14 DIAGNOSIS — I2510 Atherosclerotic heart disease of native coronary artery without angina pectoris: Secondary | ICD-10-CM | POA: Diagnosis not present

## 2015-06-14 DIAGNOSIS — M79602 Pain in left arm: Secondary | ICD-10-CM

## 2015-06-14 DIAGNOSIS — Z79899 Other long term (current) drug therapy: Secondary | ICD-10-CM | POA: Diagnosis not present

## 2015-06-14 DIAGNOSIS — G2581 Restless legs syndrome: Secondary | ICD-10-CM | POA: Diagnosis not present

## 2015-06-14 DIAGNOSIS — M1990 Unspecified osteoarthritis, unspecified site: Secondary | ICD-10-CM | POA: Diagnosis not present

## 2015-06-14 DIAGNOSIS — F329 Major depressive disorder, single episode, unspecified: Secondary | ICD-10-CM | POA: Diagnosis not present

## 2015-06-14 DIAGNOSIS — Z9889 Other specified postprocedural states: Secondary | ICD-10-CM | POA: Diagnosis not present

## 2015-06-14 DIAGNOSIS — Z7982 Long term (current) use of aspirin: Secondary | ICD-10-CM | POA: Diagnosis not present

## 2015-06-14 DIAGNOSIS — Z7902 Long term (current) use of antithrombotics/antiplatelets: Secondary | ICD-10-CM | POA: Diagnosis not present

## 2015-06-14 DIAGNOSIS — E785 Hyperlipidemia, unspecified: Secondary | ICD-10-CM | POA: Diagnosis not present

## 2015-06-14 LAB — CBC
HCT: 40.1 % (ref 36.0–46.0)
Hemoglobin: 13.2 g/dL (ref 12.0–15.0)
MCH: 29.5 pg (ref 26.0–34.0)
MCHC: 32.9 g/dL (ref 30.0–36.0)
MCV: 89.7 fL (ref 78.0–100.0)
Platelets: 203 10*3/uL (ref 150–400)
RBC: 4.47 MIL/uL (ref 3.87–5.11)
RDW: 12.8 % (ref 11.5–15.5)
WBC: 7.6 10*3/uL (ref 4.0–10.5)

## 2015-06-14 LAB — BASIC METABOLIC PANEL
Anion gap: 13 (ref 5–15)
BUN: 22 mg/dL — ABNORMAL HIGH (ref 6–20)
CO2: 23 mmol/L (ref 22–32)
Calcium: 10.5 mg/dL — ABNORMAL HIGH (ref 8.9–10.3)
Chloride: 103 mmol/L (ref 101–111)
Creatinine, Ser: 0.75 mg/dL (ref 0.44–1.00)
GFR calc Af Amer: >60 mL/min (ref >60)
GFR calc non Af Amer: >60 mL/min (ref >60)
Glucose, Bld: 126 mg/dL — ABNORMAL HIGH (ref 65–99)
Potassium: 4 mmol/L (ref 3.5–5.1)
Sodium: 139 mmol/L (ref 135–145)

## 2015-06-14 LAB — D-DIMER, QUANTITATIVE: D-Dimer, Quant: <0.27 ug/mL-FEU (ref 0.00–0.50)

## 2015-06-14 LAB — TROPONIN I: Troponin I: <0.03 ng/mL (ref <0.031)

## 2015-06-14 MED ORDER — DIAZEPAM 5 MG PO TABS
5.0000 mg | ORAL_TABLET | Freq: Once | ORAL | Status: AC
  Administered 2015-06-14: 5 mg via ORAL
  Filled 2015-06-14: qty 1

## 2015-06-14 MED ORDER — IBUPROFEN 400 MG PO TABS: 400.0000 mg | ORAL_TABLET | Freq: Three times a day (TID) | ORAL | Status: DC | PRN

## 2015-06-14 NOTE — ED Notes (Signed)
The pt is c/o lt arm pain just above the lt elbow for one week.  The pt is afraid that it is her heart.  She has stents in her heart from 2006 she is taking some type blood thinner.  No chest pain no known injury

## 2015-06-14 NOTE — Discharge Instructions (Signed)
Cryotherapy  Cryotherapy means treatment with cold. Ice or gel packs can be used to reduce both pain and swelling. Ice is the most helpful within the first 24 to 48 hours after an injury or flare-up from overusing a muscle or joint. Sprains, strains, spasms, burning pain, shooting pain, and aches can all be eased with ice. Ice can also be used when recovering from surgery. Ice is effective, has very few side effects, and is safe for most people to use.  PRECAUTIONS   Ice is not a safe treatment option for people with:  · Raynaud phenomenon. This is a condition affecting small blood vessels in the extremities. Exposure to cold may cause your problems to return.  · Cold hypersensitivity. There are many forms of cold hypersensitivity, including:    Cold urticaria. Red, itchy hives appear on the skin when the tissues begin to warm after being iced.    Cold erythema. This is a red, itchy rash caused by exposure to cold.    Cold hemoglobinuria. Red blood cells break down when the tissues begin to warm after being iced. The hemoglobin that carry oxygen are passed into the urine because they cannot combine with blood proteins fast enough.  · Numbness or altered sensitivity in the area being iced.  If you have any of the following conditions, do not use ice until you have discussed cryotherapy with your caregiver:  · Heart conditions, such as arrhythmia, angina, or chronic heart disease.  · High blood pressure.  · Healing wounds or open skin in the area being iced.  · Current infections.  · Rheumatoid arthritis.  · Poor circulation.  · Diabetes.  Ice slows the blood flow in the region it is applied. This is beneficial when trying to stop inflamed tissues from spreading irritating chemicals to surrounding tissues. However, if you expose your skin to cold temperatures for too long or without the proper protection, you can damage your skin or nerves. Watch for signs of skin damage due to cold.  HOME CARE INSTRUCTIONS  Follow  these tips to use ice and cold packs safely.  · Place a dry or damp towel between the ice and skin. A damp towel will cool the skin more quickly, so you may need to shorten the time that the ice is used.  · For a more rapid response, add gentle compression to the ice.  · Ice for no more than 10 to 20 minutes at a time. The bonier the area you are icing, the less time it will take to get the benefits of ice.  · Check your skin after 5 minutes to make sure there are no signs of a poor response to cold or skin damage.  · Rest 20 minutes or more between uses.  · Once your skin is numb, you can end your treatment. You can test numbness by very lightly touching your skin. The touch should be so light that you do not see the skin dimple from the pressure of your fingertip. When using ice, most people will feel these normal sensations in this order: cold, burning, aching, and numbness.  · Do not use ice on someone who cannot communicate their responses to pain, such as small children or people with dementia.  HOW TO MAKE AN ICE PACK  Ice packs are the most common way to use ice therapy. Other methods include ice massage, ice baths, and cryosprays. Muscle creams that cause a cold, tingly feeling do not offer the same benefits   that ice offers and should not be used as a substitute unless recommended by your caregiver.  To make an ice pack, do one of the following:  · Place crushed ice or a bag of frozen vegetables in a sealable plastic bag. Squeeze out the excess air. Place this bag inside another plastic bag. Slide the bag into a pillowcase or place a damp towel between your skin and the bag.  · Mix 3 parts water with 1 part rubbing alcohol. Freeze the mixture in a sealable plastic bag. When you remove the mixture from the freezer, it will be slushy. Squeeze out the excess air. Place this bag inside another plastic bag. Slide the bag into a pillowcase or place a damp towel between your skin and the bag.  SEEK MEDICAL CARE  IF:  · You develop white spots on your skin. This may give the skin a blotchy (mottled) appearance.  · Your skin turns blue or pale.  · Your skin becomes waxy or hard.  · Your swelling gets worse.  MAKE SURE YOU:   · Understand these instructions.  · Will watch your condition.  · Will get help right away if you are not doing well or get worse.     This information is not intended to replace advice given to you by your health care provider. Make sure you discuss any questions you have with your health care provider.     Document Released: 11/08/2010 Document Revised: 04/04/2014 Document Reviewed: 11/08/2010  Elsevier Interactive Patient Education ©2016 [employer]      Heat Therapy  Heat therapy can help ease sore, stiff, injured, and tight muscles and joints. Heat relaxes your muscles, which may help ease your pain.   RISKS AND COMPLICATIONS  If you have any of the following conditions, do not use heat therapy unless your health care provider has approved:  · Poor circulation.  · Healing wounds or scarred skin in the area being treated.  · Diabetes, heart disease, or high blood pressure.  · Not being able to feel (numbness) the area being treated.  · Unusual swelling of the area being treated.  · Active infections.  · Blood clots.  · Cancer.  · Inability to communicate pain. This may include young children and people who have problems with their brain function (dementia).  · Pregnancy.  Heat therapy should only be used on old, pre-existing, or long-lasting (chronic) injuries. Do not use heat therapy on new injuries unless directed by your health care provider.  HOW TO USE HEAT THERAPY  There are several different kinds of heat therapy, including:  · Moist heat pack.  · Warm water bath.  · Hot water bottle.  · Electric heating pad.  · Heated gel pack.  · Heated wrap.  · Electric heating pad.  Use the heat therapy method suggested by your health care provider. Follow your health care provider's instructions on when  and how to use heat therapy.  GENERAL HEAT THERAPY RECOMMENDATIONS  · Do not sleep while using heat therapy. Only use heat therapy while you are awake.  · Your skin may turn pink while using heat therapy. Do not use heat therapy if your skin turns red.  · Do not use heat therapy if you have new pain.  · High heat or long exposure to heat can cause burns. Be careful when using heat therapy to avoid burning your skin.  · Do not use heat therapy on areas of your skin that are   already irritated, such as with a rash or sunburn.  SEEK MEDICAL CARE IF:  · You have blisters, redness, swelling, or numbness.  · You have new pain.  · Your pain is worse.  MAKE SURE YOU:  · Understand these instructions.  · Will watch your condition.  · Will get help right away if you are not doing well or get worse.     This information is not intended to replace advice given to you by your health care provider. Make sure you discuss any questions you have with your health care provider.     Document Released: 06/06/2011 Document Revised: 04/04/2014 Document Reviewed: 05/07/2013  Elsevier Interactive Patient Education ©2016 [employer]    Musculoskeletal Pain  Musculoskeletal pain is muscle and boney aches and pains. These pains can occur in any part of the body. Your caregiver may treat you without knowing the cause of the pain. They may treat you if blood or urine tests, X-rays, and other tests were normal.   CAUSES  There is often not a definite cause or reason for these pains. These pains may be caused by a type of germ (virus). The discomfort may also come from overuse. Overuse includes working out too hard when your body is not fit. Boney aches also come from weather changes. Bone is sensitive to atmospheric pressure changes.  HOME CARE INSTRUCTIONS   · Ask when your test results will be ready. Make sure you get your test results.  · Only take over-the-counter or prescription medicines for pain, discomfort, or fever as directed by your  caregiver. If you were given medications for your condition, do not drive, operate machinery or power tools, or sign legal documents for 24 hours. Do not drink alcohol. Do not take sleeping pills or other medications that may interfere with treatment.  · Continue all activities unless the activities cause more pain. When the pain lessens, slowly resume normal activities. Gradually increase the intensity and duration of the activities or exercise.  · During periods of severe pain, bed rest may be helpful. Lay or sit in any position that is comfortable.  · Putting ice on the injured area.    Put ice in a bag.    Place a towel between your skin and the bag.    Leave the ice on for 15 to 20 minutes, 3 to 4 times a day.  · Follow up with your caregiver for continued problems and no reason can be found for the pain. If the pain becomes worse or does not go away, it may be necessary to repeat tests or do additional testing. Your caregiver may need to look further for a possible cause.  SEEK IMMEDIATE MEDICAL CARE IF:  · You have pain that is getting worse and is not relieved by medications.  · You develop chest pain that is associated with shortness or breath, sweating, feeling sick to your stomach (nauseous), or throw up (vomit).  · Your pain becomes localized to the abdomen.  · You develop any new symptoms that seem different or that concern you.  MAKE SURE YOU:   · Understand these instructions.  · Will watch your condition.  · Will get help right away if you are not doing well or get worse.     This information is not intended to replace advice given to you by your health care provider. Make sure you discuss any questions you have with your health care provider.     Document Released:   03/14/2005 Document Revised: 06/06/2011 Document Reviewed: 11/16/2012  Elsevier Interactive Patient Education ©2016 [employer] Inc.

## 2015-06-14 NOTE — ED Provider Notes (Signed)
CSN: [id number]     Arrival date & time 06/14/15  0150 History  By signing my name below, I, Hansel Feinstein, attest that this documentation has been prepared under the direction and in the presence of Varney Biles, MD. Electronically Signed: Hansel Feinstein, ED Scribe. 06/14/2015. 3:14 AM.    Chief Complaint  Patient presents with  . Arm Pain   The history is provided by the patient. No language interpreter was used.   HPI Comments: [name] is a 70 y.o. female with h/o heart disease, GERD, CAD, HTN, HLD, diastolic dysfunction, cardiac stents who presents to the Emergency Department complaining of moderate, intermittent, throbbing right forearm soreness for one week. No known injury or trauma. Pt states she has been using ice with mild relief of her pain. Pt denies taking OTC medications at home to improve symptoms. She notes that she was working in the yard before the onset of her pain and believes she may have pulled a muscle. Per pt, the pain is worse when she lies down, but is otherwise unaffected by other factors. No h/o MI. Per pt, she was not having arm pain before discovery of blockages leading to cardiac stent. No h/o PE/DVT, cancer, recent surgery or trauma to the arm, recent long travel. She denies numbness, paresthesia, CP, nausea, SOB, diaphoresis.   Past Medical History  Diagnosis Date  . Coronary stent occlusion   . Arthritis   . Heart disease   . Depression   . GERD (gastroesophageal reflux disease)   . Coronary artery disease     s/p PCI of LAD/RCA, cath 2007 with 30% mid LAD, 20% OM1, 70% distal left circ too small for PCI, 90% acute RV mariginal too small for PCI  . Hypertension   . Hyperlipemia   . Diastolic dysfunction   . RLS (restless legs syndrome)    Past Surgical History  Procedure Laterality Date  . Rotator cuff repair Left     shoulder  . Cholecystectomy    . Lumbar disc surgery    . Tendon repair Right     ankle  . Coronary stent placement      x2   . Cardiac catheterization    . 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 Total knee arthroplasty Left 12/21/2012    Procedure: LEFT TOTAL KNEE ARTHROPLASTY;  Surgeon: Augustin Schooling, MD;  Location: [facility];  Service: Orthopedics;  Laterality: Left;   Family History  Problem Relation Age of Onset  . Heart Problems Father    Social History  Substance Use Topics  . Smoking status: Never Smoker   . Smokeless tobacco: Never Used  . Alcohol Use: No   OB History    No data available     Review of Systems A complete 10 system review of systems was obtained and all systems are negative except as noted in the HPI and PMH.    Allergies  Lipitor and Prednisone  Home Medications   Prior to Admission medications   Medication Sig Start Date End Date Taking? Authorizing Provider  aspirin EC 81 MG tablet Take 81 mg by mouth every evening.    Yes Historical Provider, MD  Calcium Carbonate-Vitamin D (CALCIUM 600+D) 600-400 MG-UNIT per tablet Take 1 tablet by mouth daily.   Yes Historical Provider, MD  cholecalciferol (VITAMIN D) 1000 UNITS tablet Take 1,000 Units by mouth daily.   Yes Historical Provider, MD  clopidogrel (PLAVIX) 75 MG tablet Take 75 mg  by mouth every evening.    Yes Historical Provider, MD  esomeprazole (NEXIUM) 20 MG capsule Take 20 mg by mouth daily before breakfast.   Yes Historical Provider, MD  ezetimibe (ZETIA) 10 MG tablet Take 10 mg by mouth every evening.    Yes Historical Provider, MD  isosorbide mononitrate (IMDUR) 60 MG 24 hr tablet Take 60 mg by mouth daily.   Yes Historical Provider, MD  metoprolol succinate (TOPROL-XL) 25 MG 24 hr tablet Take 0.5 tablets (12.5 mg total) by mouth daily. 10/22/14  Yes Sueanne Margarita, MD  Multiple Vitamins-Minerals (MULTIVITAMIN ADULT PO) Take 1 tablet by mouth daily.   Yes Historical Provider, MD  Omega-3 Fatty Acids (FISH OIL PO) Take 1,000 mg by mouth daily.   Yes Historical Provider, MD  pravastatin (PRAVACHOL) 80  MG tablet Take 80 mg by mouth daily. 06/02/14  Yes Historical Provider, MD  rOPINIRole (REQUIP) 0.5 MG tablet Take 1-2 mg by mouth 2 (two) times daily as needed. For restless leg syndrome   Yes Historical Provider, MD  sertraline (ZOLOFT) 50 MG tablet Take 50 mg by mouth daily.   Yes Historical Provider, MD  ibuprofen (ADVIL,MOTRIN) 400 MG tablet Take 1 tablet (400 mg total) by mouth every 8 (eight) hours as needed. 06/14/15   Hildagard Sobecki, MD   BP 154/78 mmHg  Pulse 60  Temp(Src) 98.2 F (36.8 C) (Oral)  Resp 21  SpO2 98% Physical Exam  Constitutional: She is oriented to person, place, and time. She appears well-developed and well-nourished.  HENT:  Head: Normocephalic and atraumatic.  Eyes: Conjunctivae and EOM are normal. Pupils are equal, round, and reactive to light.  Neck: Normal range of motion. Neck supple.  Cardiovascular: Normal rate.   Pulmonary/Chest: Effort normal. No respiratory distress.  Abdominal: She exhibits no distension.  Musculoskeletal: Normal range of motion. She exhibits no edema or tenderness.  Upper extremity exam reveals no significant edema of the arm. No erythema or deformity. Actively, there is no TTP. Tenderness is not reproduced with any active ROM of the shoulder or elbow. 2+ and equal radial pulses.   Neurological: She is alert and oriented to person, place, and time.  Skin: Skin is warm and dry.  Psychiatric: She has a normal mood and affect. Her behavior is normal.  Nursing note and vitals reviewed.   ED Course  Procedures (including critical care time) DIAGNOSTIC STUDIES: Oxygen Saturation is 98% on RA, normal by my interpretation.    COORDINATION OF CARE: 3:11 AM Discussed treatment plan with pt at bedside which includes lab work, EKG and pt agreed to plan.   Labs Review Labs Reviewed  BASIC METABOLIC PANEL - Abnormal; Notable for the following:    Glucose, Bld 126 (*)    BUN 22 (*)    Calcium 10.5 (*)    All other components within  normal limits  CBC  TROPONIN I  D-DIMER, QUANTITATIVE (NOT AT [facility])    I have personally reviewed and evaluated these lab results as part of my medical decision-making.   EKG Interpretation   Date/Time:  Sunday June 14 2015 02:01:20 EDT Ventricular Rate:  57 PR Interval:  192 QRS Duration: 92 QT Interval:  416 QTC Calculation: 404 R Axis:   29 Text Interpretation:  Sinus bradycardia Otherwise normal ECG No  significant change since last tracing Confirmed by Kathrynn Humble, MD, Zyier Dykema  ([id number]) on 06/14/2015 3:13:08 AM      MDM   Final diagnoses:  Arm pain, anterior, left  I personally performed the services described in this documentation, which was scribed in my presence. The recorded information has been reviewed and is accurate.  Pt comes in with throbbing arm pain, intermittent, unprovoked for 1.5 weeks. The pain has no aggravating or relieving factors. She has no chest pain, dib, and she denies any exertional component to the pain. UE exam is normal - pulse, sensory exam fine and grossly normal appearing arm.  Screening ekg and trop is neg. Dimer ordered - and is neg.  PT has no symptoms right now. Will ask her to see her pcp for further eval.  Strict ER return precautions have been discussed, and patient is agreeing with the plan and is comfortable with the workup done and the recommendations from the ER.    Varney Biles, MD 06/14/15 [phone]

## 2015-06-14 NOTE — ED Notes (Signed)
Dr. Nanavati at bedside 

## 2015-06-16 DIAGNOSIS — M25522 Pain in left elbow: Secondary | ICD-10-CM | POA: Diagnosis not present

## 2015-07-03 ENCOUNTER — Ambulatory Visit
Admission: RE | Admit: 2015-07-03 | Discharge: 2015-07-03 | Disposition: A | Discharge status: Home / Self Care | Payer: Medicare Other | Source: Ambulatory Visit

## 2015-07-03 DIAGNOSIS — Z1231 Encounter for screening mammogram for malignant neoplasm of breast: Secondary | ICD-10-CM

## 2015-07-23 DIAGNOSIS — H25812 Combined forms of age-related cataract, left eye: Secondary | ICD-10-CM | POA: Diagnosis not present

## 2015-08-04 DIAGNOSIS — H25812 Combined forms of age-related cataract, left eye: Secondary | ICD-10-CM | POA: Diagnosis not present

## 2015-08-04 DIAGNOSIS — H2512 Age-related nuclear cataract, left eye: Secondary | ICD-10-CM | POA: Diagnosis not present

## 2015-10-20 DIAGNOSIS — I10 Essential (primary) hypertension: Secondary | ICD-10-CM | POA: Diagnosis not present

## 2015-10-20 DIAGNOSIS — Z1389 Encounter for screening for other disorder: Secondary | ICD-10-CM | POA: Diagnosis not present

## 2015-10-20 DIAGNOSIS — F322 Major depressive disorder, single episode, severe without psychotic features: Secondary | ICD-10-CM | POA: Diagnosis not present

## 2015-10-20 DIAGNOSIS — F39 Unspecified mood [affective] disorder: Secondary | ICD-10-CM | POA: Diagnosis not present

## 2015-10-20 DIAGNOSIS — K219 Gastro-esophageal reflux disease without esophagitis: Secondary | ICD-10-CM | POA: Diagnosis not present

## 2015-10-20 DIAGNOSIS — E7800 Pure hypercholesterolemia, unspecified: Secondary | ICD-10-CM | POA: Diagnosis not present

## 2015-10-20 DIAGNOSIS — G2581 Restless legs syndrome: Secondary | ICD-10-CM | POA: Diagnosis not present

## 2015-10-20 DIAGNOSIS — R7301 Impaired fasting glucose: Secondary | ICD-10-CM | POA: Diagnosis not present

## 2015-10-20 DIAGNOSIS — F411 Generalized anxiety disorder: Secondary | ICD-10-CM | POA: Diagnosis not present

## 2015-10-20 DIAGNOSIS — I2510 Atherosclerotic heart disease of native coronary artery without angina pectoris: Secondary | ICD-10-CM | POA: Diagnosis not present

## 2015-10-26 DIAGNOSIS — B351 Tinea unguium: Secondary | ICD-10-CM | POA: Diagnosis not present

## 2015-10-26 DIAGNOSIS — M79672 Pain in left foot: Secondary | ICD-10-CM | POA: Diagnosis not present

## 2015-10-26 DIAGNOSIS — M79671 Pain in right foot: Secondary | ICD-10-CM | POA: Diagnosis not present

## 2015-10-26 DIAGNOSIS — L89892 Pressure ulcer of other site, stage 2: Secondary | ICD-10-CM | POA: Diagnosis not present

## 2015-11-01 NOTE — Progress Notes (Signed)
Cardiology Office Note    Date:  11/02/2015   ID:  [name], Alferd Apa 09-16-1945, MRN [id number]  PCP:  Simona Huh, MD  Cardiologist:  Fransico Him, MD   Chief Complaint  Patient presents with  . Coronary Artery Disease  . Hypertension  . Hyperlipidemia    History of Present Illness:  [name] is a 70 y.o. female with a history of CAD, HTN and dyslipidemia presents today for followup. She is doing well. She denies any chest pain, SOB, DOE, LE edema, dizziness, palpitations or syncope.     Past Medical History:  Diagnosis Date  . Arthritis   . Coronary artery disease    s/p PCI of LAD/RCA, cath 2007 with 30% mid LAD, 20% OM1, 70% distal left circ too small for PCI, 90% acute RV mariginal too small for PCI  . Coronary stent occlusion   . Depression   . Diastolic dysfunction   . GERD (gastroesophageal reflux disease)   . Heart disease   . Hyperlipemia   . Hypertension   . RLS (restless legs syndrome)     Past Surgical History:  Procedure Laterality Date  . CARDIAC CATHETERIZATION    . CHOLECYSTECTOMY    . CORONARY STENT PLACEMENT     x2  . LUMBAR DISC SURGERY    . ROTATOR CUFF REPAIR Left    shoulder  . TENDON REPAIR Right    ankle  . TOTAL KNEE ARTHROPLASTY Left 12/21/2012   Dr Veverly Fells  . TOTAL KNEE ARTHROPLASTY Left 12/21/2012   Procedure: LEFT TOTAL KNEE ARTHROPLASTY;  Surgeon: Augustin Schooling, MD;  Location: [facility];  Service: Orthopedics;  Laterality: Left;    Current Medications: Outpatient Medications Prior to Visit  Medication Sig Dispense Refill  . aspirin EC 81 MG tablet Take 81 mg by mouth every evening.     . Calcium Carbonate-Vitamin D (CALCIUM 600+D) 600-400 MG-UNIT per tablet Take 1 tablet by mouth daily.    . cholecalciferol (VITAMIN D) 1000 UNITS tablet Take 1,000 Units by mouth daily.    . clopidogrel (PLAVIX) 75 MG tablet Take 75 mg by mouth every evening.     Marland Kitchen esomeprazole (NEXIUM) 20 MG capsule Take 20 mg by mouth  daily before breakfast.    . ezetimibe (ZETIA) 10 MG tablet Take 10 mg by mouth every evening.     Marland Kitchen ibuprofen (ADVIL,MOTRIN) 400 MG tablet Take 1 tablet (400 mg total) by mouth every 8 (eight) hours as needed. 30 tablet 0  . isosorbide mononitrate (IMDUR) 60 MG 24 hr tablet Take 60 mg by mouth daily.    . metoprolol succinate (TOPROL-XL) 25 MG 24 hr tablet Take 0.5 tablets (12.5 mg total) by mouth daily.    . Multiple Vitamins-Minerals (MULTIVITAMIN ADULT PO) Take 1 tablet by mouth daily.    . Omega-3 Fatty Acids (FISH OIL PO) Take 1,000 mg by mouth daily.    . pravastatin (PRAVACHOL) 80 MG tablet Take 80 mg by mouth daily.    Marland Kitchen rOPINIRole (REQUIP) 0.5 MG tablet Take 1-2 mg by mouth 2 (two) times daily as needed. For restless leg syndrome    . sertraline (ZOLOFT) 50 MG tablet Take 50 mg by mouth daily.     No facility-administered medications prior to visit.      Allergies:   Lipitor [atorvastatin]; Prednisone; and Limbitrol ds  [chlordiazepoxide-amitriptyline]   Social History   Social History  . Marital status: Legally Separated    Spouse name: [name]  .  Number of children: 1  . Years of education: GED   Occupational History  . Retired Unemployed   Social History Main Topics  . Smoking status: Never Smoker  . Smokeless tobacco: Never Used  . Alcohol use No  . Drug use: No  . Sexual activity: Not Asked   Other Topics Concern  . None   Social History Narrative   Pt lives at home alone.   Caffeine: 2-3 cups of coffee daily     Family History:  The patient's family history includes Heart Problems in her father.   ROS:   Please see the history of present illness.    ROS All other systems reviewed and are negative.   PHYSICAL EXAM:   VS:  BP 120/68   Pulse (!) 54   Ht 5\' 4"  (1.626 m)   Wt 199 lb 12.8 oz (90.6 kg)   SpO2 97%   BMI 34.30 kg/m    GEN: Well nourished, well developed, in no acute distress  HEENT: normal  Neck: no JVD, carotid bruits, or  masses Cardiac: RRR; no murmurs, rubs, or gallops,no edema.  Intact distal pulses bilaterally.  Respiratory:  clear to auscultation bilaterally, normal work of breathing GI: soft, nontender, nondistended, + BS MS: no deformity or atrophy  Skin: warm and dry, no rash Neuro:  Alert and Oriented x 3, Strength and sensation are intact Psych: euthymic mood, full affect  Wt Readings from Last 3 Encounters:  11/02/15 199 lb 12.8 oz (90.6 kg)  12/03/14 190 lb (86.2 kg)  10/29/14 198 lb 1.9 oz (89.9 kg)      Studies/Labs Reviewed:   EKG:  EKG is not ordered today.   Recent Labs: 06/14/2015: BUN 22; Creatinine, Ser 0.75; Hemoglobin 13.2; Platelets 203; Potassium 4.0; Sodium 139   Lipid Panel    Component Value Date/Time   CHOL 132 02/18/2013 0740   TRIG 117.0 02/18/2013 0740   HDL 57.70 02/18/2013 0740   CHOLHDL 2 02/18/2013 0740   VLDL 23.4 02/18/2013 0740   LDLCALC 51 02/18/2013 0740    Additional studies/ records that were reviewed today include:  none    ASSESSMENT:    1. Coronary artery disease due to lipid rich plaque   2. Essential hypertension   3. Hyperlipemia      PLAN:  In order of problems listed above:  1. ASCAD s/p PCI of LAD/RCA, cath 2007 with 30% mid LAD, 20% OM1, 70% distal left circ too small for PCI, 90% acute RV mariginal too small for PCI.  She has not had any angina.  Continue ASA/Plavix/statin/long acting nitrate and BB. 2. HTN - BP controlled on current meds.   Continue BB.   3. Hyperlipidemia - LDL goal < 70.  Check FLP and ALT.  Continue statin.      Medication Adjustments/Labs and Tests Ordered: Current medicines are reviewed at length with the patient today.  Concerns regarding medicines are outlined above.  Medication changes, Labs and Tests ordered today are listed in the Patient Instructions below.  There are no Patient Instructions on file for this visit.   Signed, Fransico Him, MD  11/02/2015 10:10 AM    [address], [place], [place]  [address] Phone: [phone]; Fax: [phone]

## 2015-11-02 ENCOUNTER — Encounter: Payer: Self-pay | Admitting: Cardiology

## 2015-11-02 ENCOUNTER — Encounter (INDEPENDENT_AMBULATORY_CARE_PROVIDER_SITE_OTHER): Payer: Self-pay

## 2015-11-02 ENCOUNTER — Ambulatory Visit (INDEPENDENT_AMBULATORY_CARE_PROVIDER_SITE_OTHER): Payer: Medicare Other | Admitting: Cardiology

## 2015-11-02 VITALS — BP 120/68 | HR 54 | Ht 64.0 in | Wt 199.8 lb

## 2015-11-02 DIAGNOSIS — I10 Essential (primary) hypertension: Secondary | ICD-10-CM

## 2015-11-02 DIAGNOSIS — E785 Hyperlipidemia, unspecified: Secondary | ICD-10-CM | POA: Diagnosis not present

## 2015-11-02 DIAGNOSIS — I2510 Atherosclerotic heart disease of native coronary artery without angina pectoris: Secondary | ICD-10-CM | POA: Diagnosis not present

## 2015-11-02 DIAGNOSIS — I2583 Coronary atherosclerosis due to lipid rich plaque: Principal | ICD-10-CM

## 2015-11-02 LAB — HEPATIC FUNCTION PANEL
ALT: 13 U/L (ref 6–29)
AST: 18 U/L (ref 10–35)
Albumin: 4 g/dL (ref 3.6–5.1)
Alkaline Phosphatase: 69 U/L (ref 33–130)
Bilirubin, Direct: 0.1 mg/dL (ref <=0.2)
Indirect Bilirubin: 0.6 mg/dL (ref 0.2–1.2)
Total Bilirubin: 0.7 mg/dL (ref 0.2–1.2)
Total Protein: 6.4 g/dL (ref 6.1–8.1)

## 2015-11-02 LAB — LIPID PANEL
Cholesterol: 166 mg/dL (ref 125–200)
HDL: 56 mg/dL (ref >=46)
LDL Cholesterol: 78 mg/dL (ref <130)
Total CHOL/HDL Ratio: 3 Ratio (ref <=5.0)
Triglycerides: 162 mg/dL — ABNORMAL HIGH (ref <150)
VLDL: 32 mg/dL — ABNORMAL HIGH (ref <30)

## 2015-11-02 NOTE — Patient Instructions (Signed)
Medication Instructions:  Your physician recommends that you continue on your current medications as directed. Please refer to the Current Medication list given to you today.   Labwork: TODAY: LFTs, Lipids  Testing/Procedures: None  Follow-Up: Your physician wants you to follow-up in: 1 year with Dr. Turner. You will receive a reminder letter in the mail two months in advance. If you don't receive a letter, please call our office to schedule the follow-up appointment.   Any Other Special Instructions Will Be Listed Below (If Applicable).     If you need a refill on your cardiac medications before your next appointment, please call your pharmacy.   

## 2015-11-04 ENCOUNTER — Telehealth: Payer: Self-pay

## 2015-11-04 DIAGNOSIS — E785 Hyperlipidemia, unspecified: Secondary | ICD-10-CM

## 2015-11-04 NOTE — Telephone Encounter (Signed)
4 month recall placed for lab work.

## 2015-11-04 NOTE — Telephone Encounter (Signed)
-----   Message from Sueanne Margarita, MD sent at 11/04/2015 11:11 AM EDT ----- Continue pravachol and zetia and try to cut back on fats and carbs and recheck FLP in 4 months

## 2015-11-19 DIAGNOSIS — M6281 Muscle weakness (generalized): Secondary | ICD-10-CM | POA: Diagnosis not present

## 2015-11-19 DIAGNOSIS — R5381 Other malaise: Secondary | ICD-10-CM | POA: Diagnosis not present

## 2015-11-23 DIAGNOSIS — R5381 Other malaise: Secondary | ICD-10-CM | POA: Diagnosis not present

## 2015-11-23 DIAGNOSIS — M6281 Muscle weakness (generalized): Secondary | ICD-10-CM | POA: Diagnosis not present

## 2015-11-25 DIAGNOSIS — R5381 Other malaise: Secondary | ICD-10-CM | POA: Diagnosis not present

## 2015-11-25 DIAGNOSIS — M6281 Muscle weakness (generalized): Secondary | ICD-10-CM | POA: Diagnosis not present

## 2015-12-01 DIAGNOSIS — M6281 Muscle weakness (generalized): Secondary | ICD-10-CM | POA: Diagnosis not present

## 2015-12-01 DIAGNOSIS — R5381 Other malaise: Secondary | ICD-10-CM | POA: Diagnosis not present

## 2015-12-08 DIAGNOSIS — R5381 Other malaise: Secondary | ICD-10-CM | POA: Diagnosis not present

## 2015-12-08 DIAGNOSIS — M6281 Muscle weakness (generalized): Secondary | ICD-10-CM | POA: Diagnosis not present

## 2015-12-10 DIAGNOSIS — R5381 Other malaise: Secondary | ICD-10-CM | POA: Diagnosis not present

## 2015-12-10 DIAGNOSIS — M6281 Muscle weakness (generalized): Secondary | ICD-10-CM | POA: Diagnosis not present

## 2015-12-22 DIAGNOSIS — M545 Low back pain: Secondary | ICD-10-CM | POA: Diagnosis not present

## 2015-12-30 DIAGNOSIS — M545 Low back pain: Secondary | ICD-10-CM | POA: Diagnosis not present

## 2016-01-04 DIAGNOSIS — M545 Low back pain: Secondary | ICD-10-CM | POA: Diagnosis not present

## 2016-01-07 DIAGNOSIS — M545 Low back pain: Secondary | ICD-10-CM | POA: Diagnosis not present

## 2016-01-12 DIAGNOSIS — M545 Low back pain: Secondary | ICD-10-CM | POA: Diagnosis not present

## 2016-01-14 DIAGNOSIS — M545 Low back pain: Secondary | ICD-10-CM | POA: Diagnosis not present

## 2016-01-19 DIAGNOSIS — M545 Low back pain: Secondary | ICD-10-CM | POA: Diagnosis not present

## 2016-01-21 DIAGNOSIS — M545 Low back pain: Secondary | ICD-10-CM | POA: Diagnosis not present

## 2016-01-22 DIAGNOSIS — M545 Low back pain: Secondary | ICD-10-CM | POA: Diagnosis not present

## 2016-01-26 DIAGNOSIS — M545 Low back pain: Secondary | ICD-10-CM | POA: Diagnosis not present

## 2016-01-28 DIAGNOSIS — M545 Low back pain: Secondary | ICD-10-CM | POA: Diagnosis not present

## 2016-02-04 DIAGNOSIS — G8929 Other chronic pain: Secondary | ICD-10-CM | POA: Diagnosis not present

## 2016-02-04 DIAGNOSIS — M545 Low back pain: Secondary | ICD-10-CM | POA: Diagnosis not present

## 2016-02-16 DIAGNOSIS — E785 Hyperlipidemia, unspecified: Secondary | ICD-10-CM | POA: Diagnosis not present

## 2016-02-17 DIAGNOSIS — G8929 Other chronic pain: Secondary | ICD-10-CM | POA: Diagnosis not present

## 2016-02-17 DIAGNOSIS — M545 Low back pain: Secondary | ICD-10-CM | POA: Diagnosis not present

## 2016-03-07 DIAGNOSIS — G8929 Other chronic pain: Secondary | ICD-10-CM | POA: Diagnosis not present

## 2016-03-07 DIAGNOSIS — M545 Low back pain: Secondary | ICD-10-CM | POA: Diagnosis not present

## 2016-04-08 ENCOUNTER — Ambulatory Visit (INDEPENDENT_AMBULATORY_CARE_PROVIDER_SITE_OTHER): Payer: Medicare Other

## 2016-04-08 ENCOUNTER — Ambulatory Visit (INDEPENDENT_AMBULATORY_CARE_PROVIDER_SITE_OTHER): Payer: Medicare Other | Admitting: Podiatry

## 2016-04-08 ENCOUNTER — Encounter: Payer: Self-pay | Admitting: Podiatry

## 2016-04-08 VITALS — BP 160/91 | HR 58 | Resp 16 | Ht 65.0 in | Wt 200.0 lb

## 2016-04-08 DIAGNOSIS — M79672 Pain in left foot: Secondary | ICD-10-CM | POA: Diagnosis not present

## 2016-04-08 DIAGNOSIS — M79671 Pain in right foot: Secondary | ICD-10-CM | POA: Diagnosis not present

## 2016-04-08 DIAGNOSIS — L84 Corns and callosities: Secondary | ICD-10-CM

## 2016-04-08 DIAGNOSIS — M779 Enthesopathy, unspecified: Secondary | ICD-10-CM

## 2016-04-08 DIAGNOSIS — M216X9 Other acquired deformities of unspecified foot: Secondary | ICD-10-CM | POA: Diagnosis not present

## 2016-04-08 MED ORDER — TRIAMCINOLONE ACETONIDE 10 MG/ML IJ SUSP
10.0000 mg | Freq: Once | INTRAMUSCULAR | Status: AC
  Administered 2016-04-08: 10 mg

## 2016-04-08 NOTE — Progress Notes (Signed)
   Subjective:    Patient ID: [name], female    DOB: January 05, 1946, 71 y.o.   MRN: [id number]  HPI Chief Complaint  Patient presents with  . Callouses    Bilateral; plantar forefoot-below great toes; pt stated, "Saw Dr. Mallie Mussel in [facility] was not satisfied witih doctor"      Review of Systems  Musculoskeletal: Positive for gait problem.  Hematological: Bruises/bleeds easily.  All other systems reviewed and are negative.      Objective:   Physical Exam        Assessment & Plan:

## 2016-04-10 NOTE — Progress Notes (Signed)
Subjective:     Patient ID: [name], female   DOB: 1945-07-07, 72 y.o.   MRN: [id number]  HPI patient presents with very thick callus formation plantar aspect first metatarsal right over left with rigid plantar deformity of the first metatarsal right over left. There is fluid between the bone and the underlying callus that's painful when pressed   Review of Systems  All other systems reviewed and are negative.      Objective:   Physical Exam  Constitutional: She is oriented to person, place, and time.  Cardiovascular: Intact distal pulses.   Musculoskeletal: Normal range of motion.  Neurological: She is oriented to person, place, and time.  Skin: Skin is warm.  Nursing note and vitals reviewed.  neurovascular status intact muscle strength adequate range of motion within normal limits with patient found to have inflammatory fluid buildup around the first MPJ right over left with rigid contracture of the first metatarsal segment and plantar keratotic lesion that's very painful when pressed. Patient's found have good digital perfusion and is well oriented 3     Assessment:     Inflammatory capsulitis fifth first MPJ right over left with rigid contracture of the metatarsal and lesion formation secondary to pressure    Plan:     H&P and all conditions reviewed and explained. I discussed the relationship of the metatarsal to her condition and that were not limited do surgery at the current time. Today I did do a plantar capsular injection right 3 mg [place] some Kenalog 5 mill grams Xylocaine and after appropriate numbness debrided the lesion fully bilateral and I will see her back when symptomatic. Ultimately could require sesamoidectomy possible elevating osteotomy or a very soft orthotics to try to reduce pressure depending on how long she gets better  X-ray indicates that there is plantar flexion first metatarsal with no indications of other pathology except for moderate  arthritis

## 2016-06-21 ENCOUNTER — Ambulatory Visit: Payer: Medicare Other | Admitting: Allergy and Immunology

## 2016-06-22 ENCOUNTER — Other Ambulatory Visit: Payer: Self-pay | Admitting: Gastroenterology

## 2016-08-17 ENCOUNTER — Encounter (HOSPITAL_COMMUNITY): Payer: Self-pay | Admitting: *Deleted

## 2016-08-23 ENCOUNTER — Ambulatory Visit (HOSPITAL_COMMUNITY)
Admission: RE | Admit: 2016-08-23 | Discharge: 2016-08-23 | Disposition: A | Discharge status: Home / Self Care | Payer: Medicare Other | Source: Ambulatory Visit | Attending: Gastroenterology | Admitting: Gastroenterology

## 2016-08-23 ENCOUNTER — Encounter (HOSPITAL_COMMUNITY)
Admission: RE | Disposition: A | Discharge status: Home / Self Care | Payer: Self-pay | Source: Ambulatory Visit | Attending: Gastroenterology

## 2016-08-23 ENCOUNTER — Encounter (HOSPITAL_COMMUNITY): Payer: Self-pay | Admitting: *Deleted

## 2016-08-23 ENCOUNTER — Ambulatory Visit (HOSPITAL_COMMUNITY): Payer: Medicare Other | Admitting: Anesthesiology

## 2016-08-23 DIAGNOSIS — G459 Transient cerebral ischemic attack, unspecified: Secondary | ICD-10-CM | POA: Diagnosis not present

## 2016-08-23 DIAGNOSIS — I10 Essential (primary) hypertension: Secondary | ICD-10-CM | POA: Insufficient documentation

## 2016-08-23 DIAGNOSIS — E7800 Pure hypercholesterolemia, unspecified: Secondary | ICD-10-CM | POA: Diagnosis not present

## 2016-08-23 DIAGNOSIS — Z955 Presence of coronary angioplasty implant and graft: Secondary | ICD-10-CM | POA: Insufficient documentation

## 2016-08-23 DIAGNOSIS — I2510 Atherosclerotic heart disease of native coronary artery without angina pectoris: Secondary | ICD-10-CM | POA: Diagnosis not present

## 2016-08-23 DIAGNOSIS — Z1211 Encounter for screening for malignant neoplasm of colon: Secondary | ICD-10-CM | POA: Diagnosis not present

## 2016-08-23 DIAGNOSIS — G2581 Restless legs syndrome: Secondary | ICD-10-CM | POA: Diagnosis not present

## 2016-08-23 DIAGNOSIS — E785 Hyperlipidemia, unspecified: Secondary | ICD-10-CM | POA: Diagnosis not present

## 2016-08-23 DIAGNOSIS — Z96652 Presence of left artificial knee joint: Secondary | ICD-10-CM | POA: Insufficient documentation

## 2016-08-23 DIAGNOSIS — F329 Major depressive disorder, single episode, unspecified: Secondary | ICD-10-CM | POA: Insufficient documentation

## 2016-08-23 HISTORY — DX: Headache, unspecified: R51.9

## 2016-08-23 HISTORY — PX: COLONOSCOPY WITH PROPOFOL: SHX5780

## 2016-08-23 HISTORY — DX: Headache: R51

## 2016-08-23 SURGERY — COLONOSCOPY WITH PROPOFOL
Anesthesia: Monitor Anesthesia Care

## 2016-08-23 MED ORDER — LIDOCAINE 2% (20 MG/ML) 5 ML SYRINGE
INTRAMUSCULAR | Status: AC
  Filled 2016-08-23: qty 5

## 2016-08-23 MED ORDER — PROPOFOL 10 MG/ML IV BOLUS
INTRAVENOUS | Status: AC
  Filled 2016-08-23: qty 20

## 2016-08-23 MED ORDER — SODIUM CHLORIDE 0.9 % IV SOLN: INTRAVENOUS | Status: DC

## 2016-08-23 MED ORDER — LIDOCAINE 2% (20 MG/ML) 5 ML SYRINGE
INTRAMUSCULAR | Status: DC | PRN
  Administered 2016-08-23: 50 mg via INTRAVENOUS

## 2016-08-23 MED ORDER — LACTATED RINGERS IV SOLN
INTRAVENOUS | Status: DC
  Administered 2016-08-23: 1000 mL via INTRAVENOUS

## 2016-08-23 MED ORDER — PROPOFOL 10 MG/ML IV BOLUS
INTRAVENOUS | Status: AC
  Filled 2016-08-23: qty 40

## 2016-08-23 MED ORDER — PROPOFOL 10 MG/ML IV BOLUS
INTRAVENOUS | Status: DC | PRN
  Administered 2016-08-23: 30 mg via INTRAVENOUS

## 2016-08-23 MED ORDER — PROPOFOL 500 MG/50ML IV EMUL
INTRAVENOUS | Status: DC | PRN
  Administered 2016-08-23: 140 ug/kg/min via INTRAVENOUS

## 2016-08-23 SURGICAL SUPPLY — 22 items

## 2016-08-23 NOTE — Anesthesia Preprocedure Evaluation (Addendum)
Anesthesia Evaluation  Patient identified by MRN, date of birth, ID band Patient awake    Reviewed: Allergy & Precautions, NPO status , Patient's Chart, lab work & pertinent test results  Airway Mallampati: II  TM Distance: >3 FB Neck ROM: Full    Dental no notable dental hx.    Pulmonary neg pulmonary ROS,    Pulmonary exam normal breath sounds clear to auscultation       Cardiovascular hypertension, + CAD  Normal cardiovascular exam Rhythm:Regular Rate:Normal     Neuro/Psych negative neurological ROS  negative psych ROS   GI/Hepatic negative GI ROS, Neg liver ROS,   Endo/Other  negative endocrine ROS  Renal/GU negative Renal ROS  negative genitourinary   Musculoskeletal negative musculoskeletal ROS (+)   Abdominal   Peds negative pediatric ROS (+)  Hematology negative hematology ROS (+)   Anesthesia Other Findings   Reproductive/Obstetrics negative OB ROS                             Anesthesia Physical Anesthesia Plan  ASA: III  Anesthesia Plan: MAC   Post-op Pain Management:    Induction: Intravenous  Airway Management Planned: Nasal Cannula  Additional Equipment:   Intra-op Plan:   Post-operative Plan:   Informed Consent: I have reviewed the patients History and Physical, chart, labs and discussed the procedure including the risks, benefits and alternatives for the proposed anesthesia with the patient or authorized representative who has indicated his/her understanding and acceptance.   Dental advisory given  Plan Discussed with: CRNA and Surgeon  Anesthesia Plan Comments:         Anesthesia Quick Evaluation

## 2016-08-23 NOTE — Transfer of Care (Signed)
Immediate Anesthesia Transfer of Care Note  Patient: [name]  Procedure(s) Performed: Procedure(s): COLONOSCOPY WITH PROPOFOL (N/A)  Patient Location: PACU and Endoscopy Unit  Anesthesia Type:MAC  Level of Consciousness: awake, alert , oriented and patient cooperative  Airway & Oxygen Therapy: Patient Spontanous Breathing and Patient connected to face mask oxygen  Post-op Assessment: Report given to RN, Post -op Vital signs reviewed and stable and Patient moving all extremities  Post vital signs: Reviewed and stable  Last Vitals:  Vitals:   08/23/16 1102  BP: (!) 155/84  Resp: 16  Temp: 36.6 C    Last Pain:  Vitals:   08/23/16 1102  TempSrc: Oral         Complications: No apparent anesthesia complications

## 2016-08-23 NOTE — H&P (Signed)
Procedure: Screening colonoscopy. Normal screening colonoscopy was performed on 02/28/2006. Chronic aspirin and Plavix to prevent coronary artery stent occlusion.  History: The patient is a 71 year old female born 05-Nov-1945. She is scheduled to undergo a repeat screening colonoscopy today.  Past medical history: Coronary artery disease. Coronary artery stent placement in September 2007. Chronic aspirin and Plavix therapy. Hypertension. Hypercholesterolemia. Aortic valve sclerosis. Diastolic dysfunction. Restless leg syndrome. Major depression. Cholecystectomy. Bilateral tubal ligation. Lumbar disc surgery. Left rotator cuff surgery. Right foot ligament repair. Left Baker's cyst surgery. Left total knee replacement surgery.  Exam: The patient is alert and lying comfortably on the endoscopy stretcher. Abdomen is soft and nontender to palpation. Lungs are clear to auscultation. Cardiac exam reveals a regular rhythm.  Plan: Proceed with screening colonoscopy

## 2016-08-23 NOTE — Op Note (Signed)
[facility] Patient Name: [name] Procedure Date: 08/23/2016 MRN: [id number] Attending MD: Garlan Fair , MD Date of Birth: May 19, 1945 CSN: [id number] Age: 71 Admit Type: Outpatient Procedure:                Colonoscopy Indications:              Screening for colorectal malignant neoplasm Providers:                Garlan Fair, MD, Cleda Daub, RN, Marcene Duos, Technician Referring MD:              Medicines:                Propofol per Anesthesia Complications:            No immediate complications. Estimated Blood Loss:     Estimated blood loss: none. Procedure:                Pre-Anesthesia Assessment:                           - Prior to the procedure, a History and Physical                            was performed, and patient medications and                            allergies were reviewed. The patient's tolerance of                            previous anesthesia was also reviewed. The risks                            and benefits of the procedure and the sedation                            options and risks were discussed with the patient.                            All questions were answered, and informed consent                            was obtained. Prior Anticoagulants: The patient has                            taken Plavix (clopidogrel), last dose was 7 days                            prior to procedure. ASA Grade Assessment: II - A                            patient with mild systemic disease. After reviewing  the risks and benefits, the patient was deemed in                            satisfactory condition to undergo the procedure.                           After obtaining informed consent, the colonoscope                            was passed under direct vision. Throughout the                            procedure, the patient's blood pressure, pulse, and            oxygen saturations were monitored continuously. The                            EC-[id number] ([id number]) scope was introduced through                            the anus and advanced to the the cecum, identified                            by appendiceal orifice and ileocecal valve. The                            colonoscopy was performed without difficulty. The                            patient tolerated the procedure well. The quality                            of the bowel preparation was good. The appendiceal                            orifice and the rectum were photographed. Restless                            legs made the exam challenging. Scope In: 11:36:26 AM Scope Out: 11:55:40 AM Scope Withdrawal Time: 0 hours 11 minutes 4 seconds  Total Procedure Duration: 0 hours 19 minutes 14 seconds  Findings:      The perianal and digital rectal examinations were normal.      The entire examined colon appeared normal. Left colonic diverticulosis       was present. Impression:               - The entire examined colon is normal.                           - No specimens collected. Moderate Sedation:      N/A- Per Anesthesia Care Recommendation:           - Patient has a contact number available for  emergencies. The signs and symptoms of potential                            delayed complications were discussed with the                            patient. Return to normal activities tomorrow.                            Written discharge instructions were provided to the                            patient.                           - Repeat colonoscopy in 5 years for screening                            purposes.                           - Resume previous diet.                           - Continue present medications. Procedure Code(s):        --- Professional ---                           Z6109, Colorectal cancer screening; colonoscopy on                             individual not meeting criteria for high risk Diagnosis Code(s):        --- Professional ---                           Z12.11, Encounter for screening for malignant                            neoplasm of colon CPT copyright 2016 American [employer] Association. All rights reserved. The codes documented in this report are preliminary and upon coder review may  be revised to meet current compliance requirements. Earle Gell, MD Garlan Fair, MD 08/23/2016 12:01:15 PM This report has been signed electronically. Number of Addenda: 0

## 2016-08-23 NOTE — Anesthesia Postprocedure Evaluation (Signed)
Anesthesia Post Note  Patient: [name]  Procedure(s) Performed: Procedure(s) (LRB): COLONOSCOPY WITH PROPOFOL (N/A)  Patient location during evaluation: PACU Anesthesia Type: MAC Level of consciousness: awake and alert Pain management: pain level controlled Vital Signs Assessment: post-procedure vital signs reviewed and stable Respiratory status: spontaneous breathing, nonlabored ventilation, respiratory function stable and patient connected to nasal cannula oxygen Cardiovascular status: stable and blood pressure returned to baseline Anesthetic complications: no       Last Vitals:  Vitals:   08/23/16 1210 08/23/16 1215  BP: (!) 154/84 (!) 166/73  Pulse: 62 60  Resp: 17 12  Temp:      Last Pain:  Vitals:   08/23/16 1102  TempSrc: Oral                 Raeden Schippers S

## 2016-08-23 NOTE — Discharge Instructions (Signed)
Colonoscopy, Adult, Care After  This sheet gives you information about how to care for yourself after your procedure. Your health care provider may also give you more specific instructions. If you have problems or questions, contact your health care provider.  What can I expect after the procedure?  After the procedure, it is common to have:  · A small amount of blood in your stool for 24 hours after the procedure.  · Some gas.  · Mild abdominal cramping or bloating.    Follow these instructions at home:  General instructions     · For the first 24 hours after the procedure:  ? Do not drive or use machinery.  ? Do not sign important documents.  ? Do not drink alcohol.  ? Do your regular daily activities at a slower pace than normal.  ? Eat soft, easy-to-digest foods.  ? Rest often.  · Take over-the-counter or prescription medicines only as told by your health care provider.  · It is up to you to get the results of your procedure. Ask your health care provider, or the department performing the procedure, when your results will be ready.  Relieving cramping and bloating   · Try walking around when you have cramps or feel bloated.  · Apply heat to your abdomen as told by your health care provider. Use a heat source that your health care provider recommends, such as a moist heat pack or a heating pad.  ? Place a towel between your skin and the heat source.  ? Leave the heat on for 20-30 minutes.  ? Remove the heat if your skin turns bright red. This is especially important if you are unable to feel pain, heat, or cold. You may have a greater risk of getting burned.  Eating and drinking   · Drink enough fluid to keep your urine clear or pale yellow.  · Resume your normal diet as instructed by your health care provider. Avoid heavy or fried foods that are hard to digest.  · Avoid drinking alcohol for as long as instructed by your health care provider.  Contact a health care provider if:  · You have blood in your stool 2-3  days after the procedure.  Get help right away if:  · You have more than a small spotting of blood in your stool.  · You pass large blood clots in your stool.  · Your abdomen is swollen.  · You have nausea or vomiting.  · You have a fever.  · You have increasing abdominal pain that is not relieved with medicine.  This information is not intended to replace advice given to you by your health care provider. Make sure you discuss any questions you have with your health care provider.  Document Released: 10/27/2003 Document Revised: 12/07/2015 Document Reviewed: 05/26/2015  Elsevier Interactive Patient Education © 2017 [employer]vier Inc[employer]

## 2016-08-25 ENCOUNTER — Encounter (HOSPITAL_COMMUNITY): Payer: Self-pay | Admitting: Gastroenterology

## 2016-08-29 NOTE — Addendum Note (Signed)
Addendum  created 08/29/16 1457 by Myrtie Soman, MD   Sign clinical note

## 2016-08-29 NOTE — Anesthesia Postprocedure Evaluation (Signed)
Anesthesia Post Note  Patient: [name]  Procedure(s) Performed: Procedure(s) (LRB): COLONOSCOPY WITH PROPOFOL (N/A)     Anesthesia Post Evaluation  Last Vitals:  Vitals:   08/23/16 1210 08/23/16 1215  BP: (!) 154/84 (!) 166/73  Pulse: 62 60  Resp: 17 12  Temp:      Last Pain:  Vitals:   08/24/16 1211  TempSrc:   PainSc: 0-No pain                 Neyland Pettengill S

## 2016-09-02 ENCOUNTER — Other Ambulatory Visit: Payer: Self-pay | Admitting: Family Medicine

## 2016-09-02 DIAGNOSIS — Z1231 Encounter for screening mammogram for malignant neoplasm of breast: Secondary | ICD-10-CM

## 2016-09-06 DIAGNOSIS — H04123 Dry eye syndrome of bilateral lacrimal glands: Secondary | ICD-10-CM | POA: Diagnosis not present

## 2016-09-12 DIAGNOSIS — N9089 Other specified noninflammatory disorders of vulva and perineum: Secondary | ICD-10-CM | POA: Diagnosis not present

## 2016-09-13 DIAGNOSIS — N9089 Other specified noninflammatory disorders of vulva and perineum: Secondary | ICD-10-CM | POA: Diagnosis not present

## 2016-09-15 ENCOUNTER — Ambulatory Visit
Admission: RE | Admit: 2016-09-15 | Discharge: 2016-09-15 | Disposition: A | Discharge status: Home / Self Care | Payer: Medicare Other | Source: Ambulatory Visit | Attending: Family Medicine | Admitting: Family Medicine

## 2016-09-15 DIAGNOSIS — Z1231 Encounter for screening mammogram for malignant neoplasm of breast: Secondary | ICD-10-CM | POA: Diagnosis not present

## 2016-10-11 DIAGNOSIS — N9089 Other specified noninflammatory disorders of vulva and perineum: Secondary | ICD-10-CM | POA: Diagnosis not present

## 2016-10-19 DIAGNOSIS — R7301 Impaired fasting glucose: Secondary | ICD-10-CM | POA: Diagnosis not present

## 2016-10-19 DIAGNOSIS — E7800 Pure hypercholesterolemia, unspecified: Secondary | ICD-10-CM | POA: Diagnosis not present

## 2016-10-19 DIAGNOSIS — I10 Essential (primary) hypertension: Secondary | ICD-10-CM | POA: Diagnosis not present

## 2016-10-19 DIAGNOSIS — Z1389 Encounter for screening for other disorder: Secondary | ICD-10-CM | POA: Diagnosis not present

## 2016-10-19 DIAGNOSIS — G2581 Restless legs syndrome: Secondary | ICD-10-CM | POA: Diagnosis not present

## 2016-10-19 DIAGNOSIS — F411 Generalized anxiety disorder: Secondary | ICD-10-CM | POA: Diagnosis not present

## 2016-10-19 DIAGNOSIS — K219 Gastro-esophageal reflux disease without esophagitis: Secondary | ICD-10-CM | POA: Diagnosis not present

## 2016-10-19 DIAGNOSIS — F39 Unspecified mood [affective] disorder: Secondary | ICD-10-CM | POA: Diagnosis not present

## 2016-10-19 DIAGNOSIS — I2510 Atherosclerotic heart disease of native coronary artery without angina pectoris: Secondary | ICD-10-CM | POA: Diagnosis not present

## 2016-10-19 DIAGNOSIS — F322 Major depressive disorder, single episode, severe without psychotic features: Secondary | ICD-10-CM | POA: Diagnosis not present

## 2016-10-21 ENCOUNTER — Telehealth: Payer: Self-pay | Admitting: Cardiology

## 2016-10-21 DIAGNOSIS — Z79899 Other long term (current) drug therapy: Secondary | ICD-10-CM

## 2016-10-21 DIAGNOSIS — E7849 Other hyperlipidemia: Secondary | ICD-10-CM

## 2016-10-21 MED ORDER — ROSUVASTATIN CALCIUM 20 MG PO TABS: 20.0000 mg | ORAL_TABLET | Freq: Every day | ORAL | 3 refills | Status: DC

## 2016-10-21 NOTE — Telephone Encounter (Signed)
Follow Up:    Returning [name]'s call from yesterday,concerning  Her lab results.

## 2016-10-21 NOTE — Telephone Encounter (Signed)
Notes recorded by Erskine Emery, Claude on 10/20/2016 at 10:56 AM EDT Would recommend change over to Crestor 20mg  daily (which should be more potent) and continue Zetia 10mg  daily and repeat lipids in 8-12 weeks. I see she was once on Crestor 10mg  daily (not sure why this was stopped), but if patient was intolerant to Crestor we can schedule her in lipid clinic for discussion of PCSK9i.   Called patient about her lab results. Per Tana Coast, pharmacist, Would recommend change over to Crestor 20mg  daily (which should be more potent) and continue Zetia 10mg  daily and repeat lipids in 8-12 weeks. I see she was once on Crestor 10mg  daily (not sure why this was stopped), but if patient was intolerant to Crestor we can schedule her in lipid clinic for discussion of PCSK9i. Patient stated she does not know why her Crestor was discontinued, but she would like to try it again. Informed patient that if she had any trouble with taking the medication to give our office a call and we could schedule her to see the lipid clinic. Discontinued patient's pravastatin, and sent Crestor to patient's mail order pharmacy.  Patient verbalized understanding and will come in for fasting lab work in September.

## 2016-11-07 ENCOUNTER — Encounter: Payer: Self-pay | Admitting: Cardiology

## 2016-11-24 ENCOUNTER — Ambulatory Visit: Payer: Medicare Other | Admitting: Cardiology

## 2016-11-24 ENCOUNTER — Ambulatory Visit (INDEPENDENT_AMBULATORY_CARE_PROVIDER_SITE_OTHER): Payer: Medicare Other | Admitting: Cardiology

## 2016-11-24 ENCOUNTER — Encounter: Payer: Self-pay | Admitting: Cardiology

## 2016-11-24 VITALS — BP 138/80 | HR 52 | Ht 65.0 in | Wt 197.6 lb

## 2016-11-24 DIAGNOSIS — I10 Essential (primary) hypertension: Secondary | ICD-10-CM | POA: Diagnosis not present

## 2016-11-24 DIAGNOSIS — I2510 Atherosclerotic heart disease of native coronary artery without angina pectoris: Secondary | ICD-10-CM

## 2016-11-24 DIAGNOSIS — E7800 Pure hypercholesterolemia, unspecified: Secondary | ICD-10-CM

## 2016-11-24 NOTE — Progress Notes (Signed)
Cardiology Office Note:    Date:  11/24/2016   ID:  [name], DOB [birth year], MRN [id number]  PCP:  Gaynelle Arabian, MD  Cardiologist:  Fransico Him, MD   Referring MD: Gaynelle Arabian, MD   Chief Complaint  Patient presents with  . Coronary Artery Disease  . Hypertension  . Hyperlipidemia    History of Present Illness:    [name] is a 71 y.o. female with a hx of CAD (s/p PCI of LAD/RCA 2007 with 30% mid LAD, 20% OM1, 70% distal left circ too small for PCI, 90% acute RV mariginal too small for PCI), HTN and dyslipidemia.  She is here today for followup.  She is doing well. She denies any chest pain, SOB, DOE, LE edema, dizziness (except when standing up too fast), palpitations or syncope.She stays very active working out in the yard a lot.   Past Medical History:  Diagnosis Date  . Arthritis   . Coronary artery disease    s/p PCI of LAD/RCA, cath 2007 with 30% mid LAD, 20% OM1, 70% distal left circ too small for PCI, 90% acute RV mariginal too small for PCI  . Depression   . Diastolic dysfunction   . GERD (gastroesophageal reflux disease)   . Headache    hx migraines 32 yrs ago  . Hyperlipemia   . Hypertension   . RLS (restless legs syndrome)     Past Surgical History:  Procedure Laterality Date  . both wrist surgery  1985  . CARDIAC CATHETERIZATION    . CHOLECYSTECTOMY    . COLONOSCOPY WITH PROPOFOL N/A 08/23/2016   Procedure: COLONOSCOPY WITH PROPOFOL;  Surgeon: Garlan Fair, MD;  Location: [facility];  Service: Endoscopy;  Laterality: N/A;  . CORONARY STENT PLACEMENT  2006   x2  . LUMBAR DISC SURGERY    . ROTATOR CUFF REPAIR Left    shoulder  . TENDON REPAIR Right    ankle  . TOTAL KNEE ARTHROPLASTY Left 12/21/2012   Procedure: LEFT TOTAL KNEE ARTHROPLASTY;  Surgeon: Augustin Schooling, MD;  Location: [facility];  Service: Orthopedics;  Laterality: Left;    Current Medications: Current Meds  Medication Sig  . aspirin EC 81 MG tablet Take  81 mg by mouth every evening.   . Calcium Carbonate-Vitamin D (CALCIUM 600+D) 600-400 MG-UNIT per tablet Take 1 tablet by mouth daily.  . Carboxymethylcellul-Glycerin (CLEAR EYES FOR DRY EYES OP) Place 1 drop into both eyes 2 (two) times daily.  . Cholecalciferol (VITAMIN D) 2000 units CAPS Take 2,000 Units by mouth daily.  . clopidogrel (PLAVIX) 75 MG tablet Take 75 mg by mouth every evening.   Marland Kitchen esomeprazole (NEXIUM) 20 MG capsule Take 20 mg by mouth daily before breakfast.  . ezetimibe (ZETIA) 10 MG tablet Take 10 mg by mouth every evening.   . isosorbide mononitrate (IMDUR) 60 MG 24 hr tablet Take 60 mg by mouth daily.  . metoprolol succinate (TOPROL-XL) 25 MG 24 hr tablet Take 0.5 tablets (12.5 mg total) by mouth daily.  . Multiple Vitamins-Minerals (MULTIVITAMIN ADULT PO) Take 1 tablet by mouth daily.  . Omega-3 Fatty Acids (FISH OIL PO) Take 1 capsule by mouth daily.   Marland Kitchen rOPINIRole (REQUIP) 0.5 MG tablet Take 0.5-1 mg by mouth 2 (two) times daily. For restless leg syndrome 1 IN THE MORNING AND 2 AT NIGHT  . rosuvastatin (CRESTOR) 20 MG tablet Take 1 tablet (20 mg total) by mouth daily.  . sertraline (ZOLOFT) 50 MG tablet  Take 50 mg by mouth at bedtime.      Allergies:   Lipitor [atorvastatin]; Prednisone; and Limbitrol ds  [chlordiazepoxide-amitriptyline]   Social History   Social History  . Marital status: Legally Separated    Spouse name: [name]  . Number of children: 1  . Years of education: GED   Occupational History  . Retired Unemployed   Social History Main Topics  . Smoking status: Never Smoker  . Smokeless tobacco: Never Used  . Alcohol use No  . Drug use: No  . Sexual activity: Not Asked   Other Topics Concern  . None   Social History Narrative   Pt lives at home alone.   Caffeine: 2-3 cups of coffee daily     Family History: The patient's family history includes Heart Problems in her father.  ROS:   Please see the history of present illness.     All  other systems reviewed and are negative.  EKGs/Labs/Other Studies Reviewed:    The following studies were reviewed today: none  EKG:  EKG is  ordered today.  The ekg ordered today demonstrates sinus bradycardia at 52bpm with no ST changse  Recent Labs: No results found for requested labs within last 8760 hours.   Recent Lipid Panel    Component Value Date/Time   CHOL 166 11/02/2015 1023   TRIG 162 (H) 11/02/2015 1023   HDL 56 11/02/2015 1023   CHOLHDL 3.0 11/02/2015 1023   VLDL 32 (H) 11/02/2015 1023   LDLCALC 78 11/02/2015 1023    Physical Exam:    VS:  BP 138/80   Pulse (!) 52   Ht 5\' 5"  (1.651 m)   Wt 197 lb 9.6 oz (89.6 kg)   BMI 32.88 kg/m     Wt Readings from Last 3 Encounters:  11/24/16 197 lb 9.6 oz (89.6 kg)  08/23/16 195 lb (88.5 kg)  04/08/16 200 lb (90.7 kg)     GEN:  Well nourished, well developed in no acute distress HEENT: Normal NECK: No JVD; No carotid bruits LYMPHATICS: No lymphadenopathy CARDIAC: RRR, norubs, gallops.  1/6 SM at RUSB RESPIRATORY:  Clear to auscultation without rales, wheezing or rhonchi  ABDOMEN: Soft, non-tender, non-distended MUSCULOSKELETAL:  No edema; No deformity  SKIN: Warm and dry NEUROLOGIC:  Alert and oriented x 3 PSYCHIATRIC:  Normal affect   ASSESSMENT:    1. Coronary artery disease involving native coronary artery of native heart without angina pectoris   2. Essential hypertension   3. Pure hypercholesterolemia    PLAN:    In order of problems listed above:  1.  ASCAD s/p PCI of LAD/RCA 2009 with cath also showing 30% mid LAD, 20% OM1, 70% distal left circ too small for PCI, 90% acute RV mariginal too small for PCI.  She has not had any anginal symptoms since I saw him.  She will continue on ASA 81mg  daily, Plavix 75mg  daily, Imdur 60mg  daily, crestor and BB.  2.  HTN- her BP is well controlled on current meds.  She will continue on Toprol 12.5mg  daily.    3.  Hyperlipidemia with LDL goal < 70  She will  continue on Crestor 20mg  daily and Zetia 10mg  daily.  I will get a copy of her last FLP from her PCP.     Medication Adjustments/Labs and Tests Ordered: Current medicines are reviewed at length with the patient today.  Concerns regarding medicines are outlined above.  No orders of the defined types were placed in  this encounter.  No orders of the defined types were placed in this encounter.   Signed, Fransico Him, MD  11/24/2016 9:30 AM    [place]

## 2016-11-24 NOTE — Patient Instructions (Signed)

## 2016-12-13 ENCOUNTER — Other Ambulatory Visit: Payer: Medicare Other

## 2016-12-13 DIAGNOSIS — E784 Other hyperlipidemia: Secondary | ICD-10-CM | POA: Diagnosis not present

## 2016-12-13 DIAGNOSIS — E7849 Other hyperlipidemia: Secondary | ICD-10-CM

## 2016-12-13 DIAGNOSIS — Z79899 Other long term (current) drug therapy: Secondary | ICD-10-CM

## 2016-12-14 LAB — LIPID PANEL
Chol/HDL Ratio: 2.5 ratio (ref 0.0–4.4)
Cholesterol, Total: 159 mg/dL (ref 100–199)
HDL: 64 mg/dL (ref >39)
LDL Calculated: 65 mg/dL (ref 0–99)
Triglycerides: 149 mg/dL (ref 0–149)
VLDL Cholesterol Cal: 30 mg/dL (ref 5–40)

## 2016-12-14 LAB — HEPATIC FUNCTION PANEL
ALT: 14 IU/L (ref 0–32)
AST: 27 IU/L (ref 0–40)
Albumin: 4.5 g/dL (ref 3.5–4.8)
Alkaline Phosphatase: 67 IU/L (ref 39–117)
Bilirubin Total: 0.8 mg/dL (ref 0.0–1.2)
Bilirubin, Direct: 0.24 mg/dL (ref 0.00–0.40)
Total Protein: 7.4 g/dL (ref 6.0–8.5)

## 2017-02-06 DIAGNOSIS — N9089 Other specified noninflammatory disorders of vulva and perineum: Secondary | ICD-10-CM | POA: Diagnosis not present

## 2017-02-06 DIAGNOSIS — B379 Candidiasis, unspecified: Secondary | ICD-10-CM | POA: Diagnosis not present

## 2017-04-06 DIAGNOSIS — S51811A Laceration without foreign body of right forearm, initial encounter: Secondary | ICD-10-CM | POA: Diagnosis not present

## 2017-05-03 DIAGNOSIS — L409 Psoriasis, unspecified: Secondary | ICD-10-CM | POA: Diagnosis not present

## 2017-06-29 ENCOUNTER — Encounter: Payer: Self-pay | Admitting: Podiatry

## 2017-06-29 ENCOUNTER — Ambulatory Visit (INDEPENDENT_AMBULATORY_CARE_PROVIDER_SITE_OTHER): Payer: Medicare Other | Admitting: Podiatry

## 2017-06-29 ENCOUNTER — Ambulatory Visit (INDEPENDENT_AMBULATORY_CARE_PROVIDER_SITE_OTHER): Payer: Medicare Other

## 2017-06-29 DIAGNOSIS — L309 Dermatitis, unspecified: Secondary | ICD-10-CM | POA: Diagnosis not present

## 2017-06-29 DIAGNOSIS — M779 Enthesopathy, unspecified: Secondary | ICD-10-CM

## 2017-06-29 DIAGNOSIS — M7750 Other enthesopathy of unspecified foot: Secondary | ICD-10-CM | POA: Diagnosis not present

## 2017-06-29 DIAGNOSIS — M722 Plantar fascial fibromatosis: Secondary | ICD-10-CM

## 2017-06-29 MED ORDER — TRIAMCINOLONE ACETONIDE 10 MG/ML IJ SUSP
10.0000 mg | Freq: Once | INTRAMUSCULAR | Status: AC
Start: 2017-06-29 — End: 2017-06-29
  Administered 2017-06-29: 10 mg

## 2017-07-06 NOTE — Progress Notes (Signed)
Subjective:   Patient ID: [name], female   DOB: 72 y.o.   MRN: [id number]   HPI Patient presents with a lot of pain in the plantar aspect of the left heel arch and also has some skin patches that she is due to see a dermatologist for and she has questions for me concerning   ROS      Objective:  Physical Exam  Neurovascular status unchanged with discomfort plantar aspect left heel with inflammation fluid around the medial band and patches in her left and right lower leg that appear to be scaly     Assessment:  Some form of dermatitis condition bilateral with inflammatory fasciitis left     Plan:  Patient will try a steroid cream for the areas and then see a dermatologist and today I focused on the heel and injected the plantar fascial left 3 mg Kenalog 5 mg Xylocaine and advised on supportive shoe gear.  X-ray indicates spur but did not indicate indications currently of acute stress fracture or advanced arthritis

## 2017-08-14 DIAGNOSIS — L304 Erythema intertrigo: Secondary | ICD-10-CM | POA: Diagnosis not present

## 2017-08-14 DIAGNOSIS — B353 Tinea pedis: Secondary | ICD-10-CM | POA: Diagnosis not present

## 2017-09-25 DIAGNOSIS — B353 Tinea pedis: Secondary | ICD-10-CM | POA: Diagnosis not present

## 2017-09-28 ENCOUNTER — Other Ambulatory Visit: Payer: Self-pay | Admitting: Cardiology

## 2017-10-05 DIAGNOSIS — M7062 Trochanteric bursitis, left hip: Secondary | ICD-10-CM | POA: Diagnosis not present

## 2017-10-05 DIAGNOSIS — M1711 Unilateral primary osteoarthritis, right knee: Secondary | ICD-10-CM | POA: Diagnosis not present

## 2017-10-09 ENCOUNTER — Other Ambulatory Visit: Payer: Self-pay | Admitting: Cardiology

## 2017-10-13 DIAGNOSIS — G4733 Obstructive sleep apnea (adult) (pediatric): Secondary | ICD-10-CM | POA: Diagnosis not present

## 2017-10-13 DIAGNOSIS — R5383 Other fatigue: Secondary | ICD-10-CM | POA: Diagnosis not present

## 2017-10-13 DIAGNOSIS — E559 Vitamin D deficiency, unspecified: Secondary | ICD-10-CM | POA: Diagnosis not present

## 2017-10-19 DIAGNOSIS — R7301 Impaired fasting glucose: Secondary | ICD-10-CM | POA: Diagnosis not present

## 2017-10-19 DIAGNOSIS — G2581 Restless legs syndrome: Secondary | ICD-10-CM | POA: Diagnosis not present

## 2017-10-19 DIAGNOSIS — I10 Essential (primary) hypertension: Secondary | ICD-10-CM | POA: Diagnosis not present

## 2017-10-19 DIAGNOSIS — K219 Gastro-esophageal reflux disease without esophagitis: Secondary | ICD-10-CM | POA: Diagnosis not present

## 2017-10-19 DIAGNOSIS — F322 Major depressive disorder, single episode, severe without psychotic features: Secondary | ICD-10-CM | POA: Diagnosis not present

## 2017-10-19 DIAGNOSIS — E7800 Pure hypercholesterolemia, unspecified: Secondary | ICD-10-CM | POA: Diagnosis not present

## 2017-10-19 DIAGNOSIS — F39 Unspecified mood [affective] disorder: Secondary | ICD-10-CM | POA: Diagnosis not present

## 2017-10-19 DIAGNOSIS — I2510 Atherosclerotic heart disease of native coronary artery without angina pectoris: Secondary | ICD-10-CM | POA: Diagnosis not present

## 2017-10-19 DIAGNOSIS — F411 Generalized anxiety disorder: Secondary | ICD-10-CM | POA: Diagnosis not present

## 2017-10-25 DIAGNOSIS — G4733 Obstructive sleep apnea (adult) (pediatric): Secondary | ICD-10-CM | POA: Diagnosis not present

## 2017-10-31 DIAGNOSIS — G4733 Obstructive sleep apnea (adult) (pediatric): Secondary | ICD-10-CM | POA: Diagnosis not present

## 2017-10-31 DIAGNOSIS — R5383 Other fatigue: Secondary | ICD-10-CM | POA: Diagnosis not present

## 2017-10-31 DIAGNOSIS — G4761 Periodic limb movement disorder: Secondary | ICD-10-CM | POA: Diagnosis not present

## 2017-11-16 DIAGNOSIS — G4733 Obstructive sleep apnea (adult) (pediatric): Secondary | ICD-10-CM | POA: Diagnosis not present

## 2017-11-21 DIAGNOSIS — E559 Vitamin D deficiency, unspecified: Secondary | ICD-10-CM | POA: Diagnosis not present

## 2017-11-21 DIAGNOSIS — G4733 Obstructive sleep apnea (adult) (pediatric): Secondary | ICD-10-CM | POA: Diagnosis not present

## 2017-11-21 DIAGNOSIS — G2581 Restless legs syndrome: Secondary | ICD-10-CM | POA: Diagnosis not present

## 2017-11-21 DIAGNOSIS — R5383 Other fatigue: Secondary | ICD-10-CM | POA: Diagnosis not present

## 2017-11-21 DIAGNOSIS — G4761 Periodic limb movement disorder: Secondary | ICD-10-CM | POA: Diagnosis not present

## 2017-12-29 DIAGNOSIS — G4733 Obstructive sleep apnea (adult) (pediatric): Secondary | ICD-10-CM | POA: Diagnosis not present

## 2018-01-09 DIAGNOSIS — L309 Dermatitis, unspecified: Secondary | ICD-10-CM | POA: Diagnosis not present

## 2018-01-09 DIAGNOSIS — B354 Tinea corporis: Secondary | ICD-10-CM | POA: Diagnosis not present

## 2018-01-09 DIAGNOSIS — Z0189 Encounter for other specified special examinations: Secondary | ICD-10-CM | POA: Diagnosis not present

## 2018-01-15 ENCOUNTER — Encounter: Payer: Self-pay | Admitting: Cardiology

## 2018-01-15 ENCOUNTER — Ambulatory Visit (INDEPENDENT_AMBULATORY_CARE_PROVIDER_SITE_OTHER): Payer: Medicare Other | Admitting: Cardiology

## 2018-01-15 VITALS — BP 132/80 | HR 45 | Ht 65.0 in | Wt 205.0 lb

## 2018-01-15 DIAGNOSIS — E7800 Pure hypercholesterolemia, unspecified: Secondary | ICD-10-CM

## 2018-01-15 DIAGNOSIS — I2510 Atherosclerotic heart disease of native coronary artery without angina pectoris: Secondary | ICD-10-CM | POA: Diagnosis not present

## 2018-01-15 DIAGNOSIS — I10 Essential (primary) hypertension: Secondary | ICD-10-CM

## 2018-01-15 NOTE — Patient Instructions (Addendum)
Medication Instructions:  Stop: Toprol 25 mg  If you need a refill on your cardiac medications before your next appointment, please call your pharmacy.   Lab work:  If you have labs (blood work) drawn today and your tests are completely normal, you will receive your results only by: Marland Kitchen MyChart Message (if you have MyChart) OR . A paper copy in the mail If you have any lab test that is abnormal or we need to change your treatment, we will call you to review the results.  Follow-Up: At [facility], you and your health needs are our priority.  As part of our continuing mission to provide you with exceptional heart care, we have created designated Provider Care Teams.  These Care Teams include your primary Cardiologist (physician) and Advanced Practice Providers (APPs -  Physician Assistants and Nurse Practitioners) who all work together to provide you with the care you need, when you need it. You will need a follow up appointment in 1 years.  Please call our office 2 months in advance to schedule this appointment.  You may see Fransico Him, MD or one of the following Advanced Practice Providers on your designated Care Team:   [place], PA-C Melina Copa, PA-C . Ermalinda Barrios, PA-C  Any Other Special Instructions Will Be Listed Below (If Applicable). Take blood pressure for 1 week in the morning and call with results.

## 2018-01-15 NOTE — Progress Notes (Signed)
Cardiology Office Note:    Date:  01/15/2018   ID:  [name], DOB [birth year], MRN [id number]  PCP:  Gaynelle Arabian, MD  Cardiologist:  Fransico Him, MD    Referring MD: Gaynelle Arabian, MD   Chief Complaint  Patient presents with  . Coronary Artery Disease  . Hypertension  . Hyperlipidemia    History of Present Illness:    [name] is a 72 y.o. female with a hx of CAD (s/p PCI of LAD/RCA 2007 with 30% mid LAD, 20% OM1, 70% distal left circ too small for PCI, 90% acute RV mariginal too small for PCI), HTN and dyslipidemia.  She is here today for followup and is doing well.  She denies any chest pain or pressure, SOB, DOE, PND, orthopnea, LE edema, dizziness, palpitations or syncope. She is compliant with her meds and is tolerating meds with no SE.    Past Medical History:  Diagnosis Date  . Arthritis   . Coronary artery disease    s/p PCI of LAD/RCA, cath 2007 with 30% mid LAD, 20% OM1, 70% distal left circ too small for PCI, 90% acute RV mariginal too small for PCI  . Depression   . Diastolic dysfunction   . GERD (gastroesophageal reflux disease)   . Headache    hx migraines 32 yrs ago  . Hyperlipemia   . Hypertension   . RLS (restless legs syndrome)     Past Surgical History:  Procedure Laterality Date  . both wrist surgery  1985  . CARDIAC CATHETERIZATION    . CHOLECYSTECTOMY    . COLONOSCOPY WITH PROPOFOL N/A 08/23/2016   Procedure: COLONOSCOPY WITH PROPOFOL;  Surgeon: Garlan Fair, MD;  Location: [facility];  Service: Endoscopy;  Laterality: N/A;  . CORONARY STENT PLACEMENT  2006   x2  . LUMBAR DISC SURGERY    . ROTATOR CUFF REPAIR Left    shoulder  . TENDON REPAIR Right    ankle  . TOTAL KNEE ARTHROPLASTY Left 12/21/2012   Procedure: LEFT TOTAL KNEE ARTHROPLASTY;  Surgeon: Augustin Schooling, MD;  Location: [facility];  Service: Orthopedics;  Laterality: Left;    Current Medications: Current Meds  Medication Sig  . aspirin EC 81 MG  tablet Take 81 mg by mouth every evening.   . Calcium Carbonate-Vitamin D (CALCIUM 600+D) 600-400 MG-UNIT per tablet Take 1 tablet by mouth daily.  . Carboxymethylcellul-Glycerin (CLEAR EYES FOR DRY EYES OP) Place 1 drop into both eyes 2 (two) times daily.  . Cholecalciferol (VITAMIN D) 2000 units CAPS Take 2,000 Units by mouth daily.  . clopidogrel (PLAVIX) 75 MG tablet Take 75 mg by mouth every evening.   Marland Kitchen esomeprazole (NEXIUM) 20 MG capsule Take 20 mg by mouth daily before breakfast.  . ezetimibe (ZETIA) 10 MG tablet Take 10 mg by mouth every evening.   . fluconazole (DIFLUCAN) 150 MG tablet Take 150 mg by mouth daily.  . isosorbide mononitrate (IMDUR) 60 MG 24 hr tablet Take 60 mg by mouth daily.  . metoprolol succinate (TOPROL-XL) 25 MG 24 hr tablet Take 0.5 tablets (12.5 mg total) by mouth daily.  . Multiple Vitamins-Minerals (MULTIVITAMIN ADULT PO) Take 1 tablet by mouth daily.  . Omega-3 Fatty Acids (FISH OIL PO) Take 1 capsule by mouth daily.   Marland Kitchen rOPINIRole (REQUIP) 0.5 MG tablet Take 0.5-1 mg by mouth 2 (two) times daily. For restless leg syndrome 1 IN THE MORNING AND 2 AT NIGHT  . rosuvastatin (CRESTOR) 20 MG tablet  Take 1 tablet (20 mg total) by mouth daily.  . sertraline (ZOLOFT) 50 MG tablet Take 50 mg by mouth at bedtime.      Allergies:   Lipitor [atorvastatin]; Other; Prednisone; and Limbitrol ds  [chlordiazepoxide-amitriptyline]   Social History   Socioeconomic History  . Marital status: Legally Separated    Spouse name: [name]  . Number of children: 1  . Years of education: GED  . Highest education level: Not on file  Occupational History  . Occupation: Retired    Fish farm manager: UNEMPLOYED  Social Needs  . Financial resource strain: Not on file  . Food insecurity:    Worry: Not on file    Inability: Not on file  . Transportation needs:    Medical: Not on file    Non-medical: Not on file  Tobacco Use  . Smoking status: Never Smoker  . Smokeless tobacco: Never  Used  Substance and Sexual Activity  . Alcohol use: No  . Drug use: No  . Sexual activity: Not on file  Lifestyle  . Physical activity:    Days per week: Not on file    Minutes per session: Not on file  . Stress: Not on file  Relationships  . Social connections:    Talks on phone: Not on file    Gets together: Not on file    Attends religious service: Not on file    Active member of club or organization: Not on file    Attends meetings of clubs or organizations: Not on file    Relationship status: Not on file  Other Topics Concern  . Not on file  Social History Narrative   Pt lives at home alone.   Caffeine: 2-3 cups of coffee daily     Family History: The patient's family history includes Heart Problems in her father.  ROS:   Please see the history of present illness.    ROS  All other systems reviewed and negative.   EKGs/Labs/Other Studies Reviewed:    The following studies were reviewed today: none  EKG:  EKG is  ordered today.  The ekg ordered today demonstrates sinus bradycardia at 45 bpm with no ST-T wave of normalities.  Recent Labs: No results found for requested labs within last 8760 hours.   Recent Lipid Panel    Component Value Date/Time   CHOL 159 12/13/2016 1233   TRIG 149 12/13/2016 1233   HDL 64 12/13/2016 1233   CHOLHDL 2.5 12/13/2016 1233   CHOLHDL 3.0 11/02/2015 1023   VLDL 32 (H) 11/02/2015 1023   LDLCALC 65 12/13/2016 1233    Physical Exam:    VS:  BP 132/80   Pulse (!) 45   Ht 5\' 5"  (1.651 m)   Wt 205 lb (93 kg)   BMI 34.11 kg/m     Wt Readings from Last 3 Encounters:  01/15/18 205 lb (93 kg)  11/24/16 197 lb 9.6 oz (89.6 kg)  08/23/16 195 lb (88.5 kg)     GEN:  Well nourished, well developed in no acute distress HEENT: Normal NECK: No JVD; No carotid bruits LYMPHATICS: No lymphadenopathy CARDIAC: RRR, no murmurs, rubs, gallops RESPIRATORY:  Clear to auscultation without rales, wheezing or rhonchi  ABDOMEN: Soft,  non-tender, non-distended MUSCULOSKELETAL:  No edema; No deformity  SKIN: Warm and dry NEUROLOGIC:  Alert and oriented x 3 PSYCHIATRIC:  Normal affect   ASSESSMENT:    1. Coronary artery disease involving native coronary artery of native heart without angina pectoris  2. Essential hypertension   3. Pure hypercholesterolemia    PLAN:    In order of problems listed above:  1.  ASCAD - s/p PCI of LAD/RCA 2007 with 30% mid LAD, 20% OM1, 70% distal left circ too small for PCI, 90% acute RV mariginal too small for PCI.  She denies any anginal symptoms.  She will continue on aspirin 81 mg daily, Imdur 60 mg daily, Plavix 75 mg daily and statin.  2.  HTN -BP is well controlled on exam today.  She is very bradycardic on exam today so I have asked her to stop her Toprol.  I have asked her to check her blood pressure daily for a week and write it down and call us with the results.  If blood pressure starts trending upward will need to add on amlodipine.  3.  Hyperlipidemia -LDL goal is less than 70.  Her LDL was 55 and ALT normal at 13 on 10/19/2017.  She will continue on Zetia 10 mg daily and Crestor 20 mg daily.   Medication Adjustments/Labs and Tests Ordered: Current medicines are reviewed at length with the patient today.  Concerns regarding medicines are outlined above.  Orders Placed This Encounter  Procedures  . EKG 12-Lead   No orders of the defined types were placed in this encounter.   Signed, Fransico Him, MD  01/15/2018 10:23 AM    [place]

## 2018-03-07 DIAGNOSIS — G4733 Obstructive sleep apnea (adult) (pediatric): Secondary | ICD-10-CM | POA: Diagnosis not present

## 2018-03-07 DIAGNOSIS — E559 Vitamin D deficiency, unspecified: Secondary | ICD-10-CM | POA: Diagnosis not present

## 2018-03-07 DIAGNOSIS — G4761 Periodic limb movement disorder: Secondary | ICD-10-CM | POA: Diagnosis not present

## 2018-03-07 DIAGNOSIS — R5383 Other fatigue: Secondary | ICD-10-CM | POA: Diagnosis not present

## 2018-03-07 DIAGNOSIS — G2581 Restless legs syndrome: Secondary | ICD-10-CM | POA: Diagnosis not present

## 2018-03-27 DIAGNOSIS — H04123 Dry eye syndrome of bilateral lacrimal glands: Secondary | ICD-10-CM | POA: Diagnosis not present

## 2018-04-10 DIAGNOSIS — M79605 Pain in left leg: Secondary | ICD-10-CM | POA: Diagnosis not present

## 2018-04-10 DIAGNOSIS — M79662 Pain in left lower leg: Secondary | ICD-10-CM | POA: Diagnosis not present

## 2018-04-11 DIAGNOSIS — M79605 Pain in left leg: Secondary | ICD-10-CM | POA: Diagnosis not present

## 2018-04-25 DIAGNOSIS — M79605 Pain in left leg: Secondary | ICD-10-CM | POA: Diagnosis not present

## 2018-05-08 DIAGNOSIS — G4733 Obstructive sleep apnea (adult) (pediatric): Secondary | ICD-10-CM | POA: Diagnosis not present

## 2018-05-08 DIAGNOSIS — G2581 Restless legs syndrome: Secondary | ICD-10-CM | POA: Diagnosis not present

## 2018-05-08 DIAGNOSIS — E559 Vitamin D deficiency, unspecified: Secondary | ICD-10-CM | POA: Diagnosis not present

## 2018-05-08 DIAGNOSIS — G4761 Periodic limb movement disorder: Secondary | ICD-10-CM | POA: Diagnosis not present

## 2018-05-08 DIAGNOSIS — R5383 Other fatigue: Secondary | ICD-10-CM | POA: Diagnosis not present

## 2018-05-14 DIAGNOSIS — E559 Vitamin D deficiency, unspecified: Secondary | ICD-10-CM | POA: Diagnosis not present

## 2018-06-04 DIAGNOSIS — G459 Transient cerebral ischemic attack, unspecified: Secondary | ICD-10-CM | POA: Diagnosis not present

## 2018-06-04 DIAGNOSIS — I10 Essential (primary) hypertension: Secondary | ICD-10-CM | POA: Diagnosis not present

## 2018-06-04 DIAGNOSIS — R531 Weakness: Secondary | ICD-10-CM | POA: Diagnosis not present

## 2018-06-04 DIAGNOSIS — R4701 Aphasia: Secondary | ICD-10-CM | POA: Diagnosis not present

## 2018-06-05 DIAGNOSIS — R4182 Altered mental status, unspecified: Secondary | ICD-10-CM | POA: Diagnosis not present

## 2018-06-05 DIAGNOSIS — Z23 Encounter for immunization: Secondary | ICD-10-CM | POA: Diagnosis not present

## 2018-06-05 DIAGNOSIS — E860 Dehydration: Secondary | ICD-10-CM | POA: Diagnosis present

## 2018-06-05 DIAGNOSIS — G2581 Restless legs syndrome: Secondary | ICD-10-CM | POA: Diagnosis not present

## 2018-06-05 DIAGNOSIS — I160 Hypertensive urgency: Secondary | ICD-10-CM | POA: Diagnosis not present

## 2018-06-05 DIAGNOSIS — G9341 Metabolic encephalopathy: Secondary | ICD-10-CM | POA: Diagnosis present

## 2018-06-05 DIAGNOSIS — Z72820 Sleep deprivation: Secondary | ICD-10-CM | POA: Diagnosis not present

## 2018-06-05 DIAGNOSIS — G4733 Obstructive sleep apnea (adult) (pediatric): Secondary | ICD-10-CM | POA: Diagnosis present

## 2018-06-05 DIAGNOSIS — T50915A Adverse effect of multiple unspecified drugs, medicaments and biological substances, initial encounter: Secondary | ICD-10-CM | POA: Diagnosis present

## 2018-06-05 DIAGNOSIS — F418 Other specified anxiety disorders: Secondary | ICD-10-CM | POA: Diagnosis present

## 2018-06-05 DIAGNOSIS — R29700 NIHSS score 0: Secondary | ICD-10-CM | POA: Diagnosis present

## 2018-06-05 DIAGNOSIS — R4701 Aphasia: Secondary | ICD-10-CM | POA: Diagnosis not present

## 2018-06-05 DIAGNOSIS — R339 Retention of urine, unspecified: Secondary | ICD-10-CM | POA: Diagnosis present

## 2018-06-05 DIAGNOSIS — N390 Urinary tract infection, site not specified: Secondary | ICD-10-CM | POA: Diagnosis not present

## 2018-06-05 DIAGNOSIS — E871 Hypo-osmolality and hyponatremia: Secondary | ICD-10-CM | POA: Diagnosis not present

## 2018-06-05 DIAGNOSIS — I10 Essential (primary) hypertension: Secondary | ICD-10-CM | POA: Diagnosis present

## 2018-06-05 DIAGNOSIS — Z79899 Other long term (current) drug therapy: Secondary | ICD-10-CM | POA: Diagnosis not present

## 2018-06-05 DIAGNOSIS — I6523 Occlusion and stenosis of bilateral carotid arteries: Secondary | ICD-10-CM | POA: Diagnosis not present

## 2018-06-05 DIAGNOSIS — R531 Weakness: Secondary | ICD-10-CM | POA: Diagnosis not present

## 2018-06-05 DIAGNOSIS — B9689 Other specified bacterial agents as the cause of diseases classified elsewhere: Secondary | ICD-10-CM | POA: Diagnosis present

## 2018-06-05 DIAGNOSIS — G459 Transient cerebral ischemic attack, unspecified: Secondary | ICD-10-CM | POA: Diagnosis not present

## 2018-06-05 DIAGNOSIS — Z7902 Long term (current) use of antithrombotics/antiplatelets: Secondary | ICD-10-CM | POA: Diagnosis not present

## 2018-06-05 DIAGNOSIS — R4781 Slurred speech: Secondary | ICD-10-CM | POA: Diagnosis not present

## 2018-06-05 DIAGNOSIS — I517 Cardiomegaly: Secondary | ICD-10-CM | POA: Diagnosis not present

## 2018-06-05 DIAGNOSIS — I2510 Atherosclerotic heart disease of native coronary artery without angina pectoris: Secondary | ICD-10-CM | POA: Diagnosis not present

## 2018-06-11 ENCOUNTER — Telehealth: Payer: Self-pay | Admitting: Diagnostic Neuroimaging

## 2018-06-11 NOTE — Telephone Encounter (Signed)
Pt is wanting to switch to Dr. Jaynee Eagles. Patient was last seen 2014 by Dr. Leta Baptist. Please advise if it is ok for pt to be scheduled w Dr. Jaynee Eagles. Patient is being referred for cognitive eval, TIA/CVA was negative, possible dementia.

## 2018-06-12 NOTE — Telephone Encounter (Signed)
Perfectly fine thanks 

## 2018-06-13 DIAGNOSIS — R41841 Cognitive communication deficit: Secondary | ICD-10-CM | POA: Diagnosis not present

## 2018-06-13 DIAGNOSIS — G451 Carotid artery syndrome (hemispheric): Secondary | ICD-10-CM | POA: Diagnosis not present

## 2018-06-18 ENCOUNTER — Telehealth: Payer: Self-pay | Admitting: Neurology

## 2018-06-18 NOTE — Telephone Encounter (Signed)
Dr. Ahern aware. 

## 2018-06-18 NOTE — Telephone Encounter (Signed)
Pt's daughter called about r/s the appt tomorrow. She said the patient calls her everyday and ask what day is it, she cannot remember conversations had 2 hours prior. She can remember her childhood. The daughter's greatest concern is keeping her from driving. She is not suppose to drive but she is so concerned she could not keep her from driving.  She is aware patients are not being seen unless urgent. She will call pt's PCP.   FYI

## 2018-06-19 ENCOUNTER — Ambulatory Visit: Payer: Medicare Other | Admitting: Neurology

## 2018-07-12 DIAGNOSIS — S93402A Sprain of unspecified ligament of left ankle, initial encounter: Secondary | ICD-10-CM | POA: Diagnosis not present

## 2018-07-17 DIAGNOSIS — M25572 Pain in left ankle and joints of left foot: Secondary | ICD-10-CM | POA: Diagnosis not present

## 2018-07-31 DIAGNOSIS — M25572 Pain in left ankle and joints of left foot: Secondary | ICD-10-CM | POA: Diagnosis not present

## 2018-08-16 DIAGNOSIS — S8262XD Displaced fracture of lateral malleolus of left fibula, subsequent encounter for closed fracture with routine healing: Secondary | ICD-10-CM | POA: Diagnosis not present

## 2018-08-16 DIAGNOSIS — M25572 Pain in left ankle and joints of left foot: Secondary | ICD-10-CM | POA: Diagnosis not present

## 2018-09-19 ENCOUNTER — Ambulatory Visit: Payer: Medicare Other | Admitting: Neurology

## 2018-09-25 DIAGNOSIS — M25572 Pain in left ankle and joints of left foot: Secondary | ICD-10-CM | POA: Diagnosis not present

## 2018-10-11 ENCOUNTER — Telehealth: Payer: Self-pay | Admitting: Neurology

## 2018-10-11 NOTE — Telephone Encounter (Signed)
Pt daughter has called to give Dr Jaynee Eagles and RN Romelle Starcher a heads up re: pt.  Daughter states pt has major memory issues and is denial, pt wants to drive which concerns daughter due to decline in memory.  Daughter would like to discuss this with RN before pt's appointment on Wed. The 22nd of July.  Daughter states that if RN is unable to call her before the appointment she will hand RN a note explaining what she will be unable to discuss in the presence of pt.

## 2018-10-17 ENCOUNTER — Encounter: Payer: Self-pay | Admitting: Neurology

## 2018-10-17 ENCOUNTER — Other Ambulatory Visit: Payer: Self-pay

## 2018-10-17 ENCOUNTER — Ambulatory Visit (INDEPENDENT_AMBULATORY_CARE_PROVIDER_SITE_OTHER): Payer: Medicare Other | Admitting: Neurology

## 2018-10-17 VITALS — BP 164/70 | HR 60 | Temp 98.4°F | Ht 64.5 in | Wt 213.0 lb

## 2018-10-17 DIAGNOSIS — F0391 Unspecified dementia with behavioral disturbance: Secondary | ICD-10-CM | POA: Diagnosis not present

## 2018-10-17 NOTE — Patient Instructions (Addendum)
Need to review records from [place] No driving per [facility] for one year
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
## 2018-10-17 NOTE — Telephone Encounter (Signed)
Dr. Jaynee Eagles will speak to daughter at appt.
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
## 2018-10-18 NOTE — Telephone Encounter (Signed)
Noted thanks °

## 2018-10-18 NOTE — Telephone Encounter (Signed)
[name], please let the patient know Dr. Jaynee Eagles says she is unable to drive.

## 2018-10-18 NOTE — Telephone Encounter (Signed)
Pt has called asking to speak with Dr Jaynee Eagles re: her keys and not being able to drive.

## 2018-10-21 ENCOUNTER — Encounter: Payer: Self-pay | Admitting: Neurology

## 2018-10-21 DIAGNOSIS — F0391 Unspecified dementia with behavioral disturbance: Secondary | ICD-10-CM | POA: Insufficient documentation

## 2018-10-21 DIAGNOSIS — F03918 Unspecified dementia, unspecified severity, with other behavioral disturbance: Secondary | ICD-10-CM | POA: Insufficient documentation

## 2018-10-22 ENCOUNTER — Telehealth: Payer: Self-pay | Admitting: *Deleted

## 2018-10-22 DIAGNOSIS — G2581 Restless legs syndrome: Secondary | ICD-10-CM | POA: Diagnosis not present

## 2018-10-22 DIAGNOSIS — R413 Other amnesia: Secondary | ICD-10-CM | POA: Diagnosis not present

## 2018-10-22 DIAGNOSIS — F411 Generalized anxiety disorder: Secondary | ICD-10-CM | POA: Diagnosis not present

## 2018-10-22 DIAGNOSIS — K219 Gastro-esophageal reflux disease without esophagitis: Secondary | ICD-10-CM | POA: Diagnosis not present

## 2018-10-22 DIAGNOSIS — I10 Essential (primary) hypertension: Secondary | ICD-10-CM | POA: Diagnosis not present

## 2018-10-22 DIAGNOSIS — F321 Major depressive disorder, single episode, moderate: Secondary | ICD-10-CM | POA: Diagnosis not present

## 2018-10-22 DIAGNOSIS — I2510 Atherosclerotic heart disease of native coronary artery without angina pectoris: Secondary | ICD-10-CM | POA: Diagnosis not present

## 2018-10-22 DIAGNOSIS — Z8673 Personal history of transient ischemic attack (TIA), and cerebral infarction without residual deficits: Secondary | ICD-10-CM | POA: Diagnosis not present

## 2018-10-22 DIAGNOSIS — F39 Unspecified mood [affective] disorder: Secondary | ICD-10-CM | POA: Diagnosis not present

## 2018-10-22 DIAGNOSIS — E7800 Pure hypercholesterolemia, unspecified: Secondary | ICD-10-CM | POA: Diagnosis not present

## 2018-10-22 DIAGNOSIS — R7301 Impaired fasting glucose: Secondary | ICD-10-CM | POA: Diagnosis not present

## 2018-10-22 NOTE — Telephone Encounter (Signed)
Pt request faxed to [facility] requesting records, all imaging reports.

## 2018-10-23 ENCOUNTER — Telehealth: Payer: Self-pay | Admitting: *Deleted

## 2018-10-23 NOTE — Telephone Encounter (Signed)
Dr. Jaynee Eagles has signed the form.  It has been faxed and confirmed to the [facility].  Also, a copy has been sent for scanning.

## 2018-10-23 NOTE — Telephone Encounter (Signed)
[place] [employer] - Request for Driver Re-Examination form completed.

## 2018-10-23 NOTE — Telephone Encounter (Signed)
-----   Message from Melvenia Beam, MD sent at 10/21/2018 12:00 PM EDT ----- Regarding: DMV form to be sent Romelle Starcher, lets fill out a DMV form and ask for her license to be revoked for dementia

## 2018-10-24 ENCOUNTER — Telehealth: Payer: Self-pay | Admitting: *Deleted

## 2018-10-24 DIAGNOSIS — E7800 Pure hypercholesterolemia, unspecified: Secondary | ICD-10-CM | POA: Diagnosis not present

## 2018-10-24 DIAGNOSIS — R7301 Impaired fasting glucose: Secondary | ICD-10-CM | POA: Diagnosis not present

## 2018-10-24 DIAGNOSIS — I10 Essential (primary) hypertension: Secondary | ICD-10-CM | POA: Diagnosis not present

## 2018-10-24 NOTE — Telephone Encounter (Signed)
R/c note labs and imaging reports. Pt records on [name].

## 2018-10-25 ENCOUNTER — Telehealth: Payer: Self-pay | Admitting: Neurology

## 2018-10-25 NOTE — Telephone Encounter (Signed)
pts daugter is requesting a call back to discuss her decline since her last appt

## 2018-10-29 NOTE — Telephone Encounter (Signed)
Returned daughter Renee's call (on [place]). Had a long discussion. She expressed her desire to please keep this information confidential as she does not want her mother (the patient) getting wind of this. She asked if it would be possible for Dr. Jaynee Eagles to call her to discuss. She also wanted to know what the records from [facility] showed and Dr. Cathren Laine thoughts on them were. She stated that last Wednesday pt went to PCP for routine check with blood work. They went to lunch, no issues. On Thursday she got a notification that pt had withdrew from her bank account to pay taxes. The pt was taken to the bank by someone whom the family does not know. The pt told other family members that she did not want to speak with the daughter Joseph Art again. The daughter said the family members told her the reason was because she will not give the pt her keys back. The daughter said this isn't the first time the pt has done things like this but she said if the pt is becoming incompetent she would like to know so she can help her. However if she is not incompetent she may need to "back off" because of the stress that this has been causing her for years. She manages pt's medications and these were taken to the pt on Saturday by pt's brother but he has expressed he will not do this long term.

## 2018-10-31 NOTE — Telephone Encounter (Signed)
Noted thanks °
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
## 2018-11-06 ENCOUNTER — Telehealth: Payer: Self-pay | Admitting: *Deleted

## 2018-11-06 NOTE — Telephone Encounter (Signed)
I called [employer] unable to reach pt.

## 2018-11-19 ENCOUNTER — Telehealth: Payer: Self-pay | Admitting: Neurology

## 2018-11-19 NOTE — Telephone Encounter (Signed)
Pt is returning a call and wanting to speak to the RN. Please call back as soon as available.

## 2018-11-20 NOTE — Telephone Encounter (Signed)
I called the pt back. [name] had tried to call the patient on 8/11. Call transferred to medical records.
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
## 2018-12-18 ENCOUNTER — Telehealth: Payer: Self-pay | Admitting: Neurology

## 2018-12-18 NOTE — Telephone Encounter (Signed)
Pt's daughter called wanting to speak to the provider and give her a heads up on what her mother has been doing. She states that the pt has hired an Forensic psychologist because she is not happy that her licence has been revoked and that she is not allowed to drive anymore. Please call daughter back when available.

## 2018-12-19 ENCOUNTER — Telehealth: Payer: Self-pay

## 2018-12-19 ENCOUNTER — Telehealth: Payer: Self-pay | Admitting: *Deleted

## 2018-12-19 NOTE — Telephone Encounter (Signed)
Noted thanks °

## 2018-12-19 NOTE — Telephone Encounter (Signed)
[name], would you be able to call and discuss with daughter please?
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
## 2018-12-19 NOTE — Telephone Encounter (Signed)
R/c medical records from [facility]. Pt notes on [employer].

## 2018-12-19 NOTE — Telephone Encounter (Signed)
[name] and [name], lets discuss thank you

## 2019-01-01 DIAGNOSIS — S0003XA Contusion of scalp, initial encounter: Secondary | ICD-10-CM | POA: Diagnosis not present

## 2019-01-09 DIAGNOSIS — M25572 Pain in left ankle and joints of left foot: Secondary | ICD-10-CM | POA: Diagnosis not present

## 2019-08-12 DIAGNOSIS — M4150 Other secondary scoliosis, site unspecified: Secondary | ICD-10-CM | POA: Insufficient documentation

## 2019-08-12 DIAGNOSIS — Z8679 Personal history of other diseases of the circulatory system: Secondary | ICD-10-CM | POA: Insufficient documentation

## 2019-08-12 DIAGNOSIS — M4180 Other forms of scoliosis, site unspecified: Secondary | ICD-10-CM | POA: Insufficient documentation

## 2019-08-14 DIAGNOSIS — M5136 Other intervertebral disc degeneration, lumbar region: Secondary | ICD-10-CM | POA: Insufficient documentation

## 2020-01-09 ENCOUNTER — Observation Stay (HOSPITAL_COMMUNITY)
Admission: EM | Admit: 2020-01-09 | Discharge: 2020-01-10 | Disposition: A | Discharge status: Home / Self Care | Payer: Medicare Other | Attending: Internal Medicine | Admitting: Internal Medicine

## 2020-01-09 ENCOUNTER — Emergency Department (HOSPITAL_COMMUNITY): Payer: Medicare Other

## 2020-01-09 ENCOUNTER — Encounter (HOSPITAL_COMMUNITY): Payer: Self-pay

## 2020-01-09 DIAGNOSIS — I10 Essential (primary) hypertension: Secondary | ICD-10-CM | POA: Diagnosis present

## 2020-01-09 DIAGNOSIS — F0391 Unspecified dementia with behavioral disturbance: Secondary | ICD-10-CM | POA: Insufficient documentation

## 2020-01-09 DIAGNOSIS — I5033 Acute on chronic diastolic (congestive) heart failure: Secondary | ICD-10-CM | POA: Diagnosis not present

## 2020-01-09 DIAGNOSIS — Z9861 Coronary angioplasty status: Secondary | ICD-10-CM | POA: Diagnosis not present

## 2020-01-09 DIAGNOSIS — I110 Hypertensive heart disease with heart failure: Secondary | ICD-10-CM | POA: Insufficient documentation

## 2020-01-09 DIAGNOSIS — I480 Paroxysmal atrial fibrillation: Secondary | ICD-10-CM | POA: Diagnosis present

## 2020-01-09 DIAGNOSIS — I4891 Unspecified atrial fibrillation: Secondary | ICD-10-CM | POA: Diagnosis not present

## 2020-01-09 DIAGNOSIS — E785 Hyperlipidemia, unspecified: Secondary | ICD-10-CM | POA: Diagnosis not present

## 2020-01-09 DIAGNOSIS — Z7982 Long term (current) use of aspirin: Secondary | ICD-10-CM | POA: Insufficient documentation

## 2020-01-09 DIAGNOSIS — D689 Coagulation defect, unspecified: Secondary | ICD-10-CM

## 2020-01-09 DIAGNOSIS — Z79899 Other long term (current) drug therapy: Secondary | ICD-10-CM | POA: Insufficient documentation

## 2020-01-09 DIAGNOSIS — I2510 Atherosclerotic heart disease of native coronary artery without angina pectoris: Secondary | ICD-10-CM | POA: Insufficient documentation

## 2020-01-09 DIAGNOSIS — Z20822 Contact with and (suspected) exposure to covid-19: Secondary | ICD-10-CM | POA: Diagnosis not present

## 2020-01-09 DIAGNOSIS — Z96652 Presence of left artificial knee joint: Secondary | ICD-10-CM | POA: Diagnosis not present

## 2020-01-09 DIAGNOSIS — R079 Chest pain, unspecified: Secondary | ICD-10-CM | POA: Diagnosis present

## 2020-01-09 DIAGNOSIS — R7989 Other specified abnormal findings of blood chemistry: Secondary | ICD-10-CM

## 2020-01-09 DIAGNOSIS — R945 Abnormal results of liver function studies: Secondary | ICD-10-CM

## 2020-01-09 LAB — HEPATIC FUNCTION PANEL
ALT: 20 U/L (ref 0–44)
AST: 42 U/L — ABNORMAL HIGH (ref 15–41)
Albumin: 4 g/dL (ref 3.5–5.0)
Alkaline Phosphatase: 63 U/L (ref 38–126)
Bilirubin, Direct: 0.3 mg/dL — ABNORMAL HIGH (ref 0.0–0.2)
Indirect Bilirubin: 0.8 mg/dL (ref 0.3–0.9)
Total Bilirubin: 1.1 mg/dL (ref 0.3–1.2)
Total Protein: 7 g/dL (ref 6.5–8.1)

## 2020-01-09 LAB — CBC
HCT: 43.4 % (ref 36.0–46.0)
Hemoglobin: 13.7 g/dL (ref 12.0–15.0)
MCH: 28.2 pg (ref 26.0–34.0)
MCHC: 31.6 g/dL (ref 30.0–36.0)
MCV: 89.3 fL (ref 80.0–100.0)
Platelets: 229 10*3/uL (ref 150–400)
RBC: 4.86 MIL/uL (ref 3.87–5.11)
RDW: 13.2 % (ref 11.5–15.5)
WBC: 8.4 10*3/uL (ref 4.0–10.5)
nRBC: 0 % (ref 0.0–0.2)

## 2020-01-09 LAB — APTT: aPTT: 35 seconds (ref 24–36)

## 2020-01-09 LAB — PROTIME-INR
INR: 2.9 — ABNORMAL HIGH (ref 0.8–1.2)
Prothrombin Time: 29 seconds — ABNORMAL HIGH (ref 11.4–15.2)

## 2020-01-09 LAB — RESPIRATORY PANEL BY RT PCR (FLU A&B, COVID)
Influenza A by PCR: NEGATIVE
Influenza B by PCR: NEGATIVE
SARS Coronavirus 2 by RT PCR: NEGATIVE

## 2020-01-09 LAB — BASIC METABOLIC PANEL
Anion gap: 10 (ref 5–15)
BUN: 17 mg/dL (ref 8–23)
CO2: 21 mmol/L — ABNORMAL LOW (ref 22–32)
Calcium: 10.2 mg/dL (ref 8.9–10.3)
Chloride: 109 mmol/L (ref 98–111)
Creatinine, Ser: 0.71 mg/dL (ref 0.44–1.00)
GFR, Estimated: >60 mL/min (ref >60)
Glucose, Bld: 116 mg/dL — ABNORMAL HIGH (ref 70–99)
Potassium: 4.5 mmol/L (ref 3.5–5.1)
Sodium: 140 mmol/L (ref 135–145)

## 2020-01-09 LAB — MAGNESIUM: Magnesium: 2.1 mg/dL (ref 1.7–2.4)

## 2020-01-09 LAB — TSH: TSH: 2.201 u[IU]/mL (ref 0.350–4.500)

## 2020-01-09 MED ORDER — METOPROLOL SUCCINATE ER 25 MG PO TB24
25.0000 mg | ORAL_TABLET | Freq: Every day | ORAL | Status: DC
  Administered 2020-01-10: 25 mg via ORAL
  Filled 2020-01-09: qty 1

## 2020-01-09 MED ORDER — METOPROLOL TARTRATE 5 MG/5ML IV SOLN: 5.0000 mg | INTRAVENOUS | Status: DC | PRN

## 2020-01-09 MED ORDER — ASPIRIN EC 81 MG PO TBEC: 81.0000 mg | DELAYED_RELEASE_TABLET | Freq: Every evening | ORAL | Status: DC

## 2020-01-09 MED ORDER — PANTOPRAZOLE SODIUM 40 MG PO TBEC
40.0000 mg | DELAYED_RELEASE_TABLET | Freq: Every day | ORAL | Status: DC
  Administered 2020-01-10: 40 mg via ORAL
  Filled 2020-01-09: qty 1

## 2020-01-09 MED ORDER — EZETIMIBE 10 MG PO TABS: 10.0000 mg | ORAL_TABLET | Freq: Every evening | ORAL | Status: DC

## 2020-01-09 MED ORDER — ROPINIROLE HCL 1 MG PO TABS
1.0000 mg | ORAL_TABLET | Freq: Every day | ORAL | Status: DC
  Administered 2020-01-10: 1 mg via ORAL
  Filled 2020-01-09 (×2): qty 1

## 2020-01-09 MED ORDER — ACETAMINOPHEN 325 MG PO TABS: 650.0000 mg | ORAL_TABLET | ORAL | Status: DC | PRN

## 2020-01-09 MED ORDER — PRAVASTATIN SODIUM 40 MG PO TABS: 80.0000 mg | ORAL_TABLET | Freq: Every evening | ORAL | Status: DC

## 2020-01-09 MED ORDER — RIVAROXABAN 20 MG PO TABS
20.0000 mg | ORAL_TABLET | Freq: Every day | ORAL | Status: DC
  Filled 2020-01-09: qty 1

## 2020-01-09 MED ORDER — ROPINIROLE HCL 0.5 MG PO TABS
0.5000 mg | ORAL_TABLET | Freq: Every day | ORAL | Status: DC
  Administered 2020-01-10: 0.5 mg via ORAL
  Filled 2020-01-09: qty 1

## 2020-01-09 MED ORDER — ONDANSETRON HCL 4 MG/2ML IJ SOLN: 4.0000 mg | Freq: Four times a day (QID) | INTRAMUSCULAR | Status: DC | PRN

## 2020-01-09 MED ORDER — METOPROLOL TARTRATE 25 MG PO TABS
25.0000 mg | ORAL_TABLET | Freq: Once | ORAL | Status: AC
  Administered 2020-01-09: 25 mg via ORAL
  Filled 2020-01-09: qty 1

## 2020-01-09 MED ORDER — ISOSORBIDE MONONITRATE ER 60 MG PO TB24
60.0000 mg | ORAL_TABLET | Freq: Every day | ORAL | Status: DC
  Administered 2020-01-10: 60 mg via ORAL
  Filled 2020-01-09: qty 1

## 2020-01-09 MED ORDER — SERTRALINE HCL 50 MG PO TABS
50.0000 mg | ORAL_TABLET | Freq: Every day | ORAL | Status: DC
  Administered 2020-01-10: 50 mg via ORAL
  Filled 2020-01-09 (×2): qty 1

## 2020-01-09 MED ORDER — APIXABAN 5 MG PO TABS
5.0000 mg | ORAL_TABLET | Freq: Two times a day (BID) | ORAL | Status: DC
  Administered 2020-01-09: 5 mg via ORAL
  Filled 2020-01-09: qty 1

## 2020-01-09 MED ORDER — MAGNESIUM OXIDE 400 (241.3 MG) MG PO TABS
400.0000 mg | ORAL_TABLET | Freq: Every day | ORAL | Status: DC
  Administered 2020-01-10: 400 mg via ORAL
  Filled 2020-01-09: qty 1

## 2020-01-09 MED ORDER — MELATONIN 5 MG PO TABS
10.0000 mg | ORAL_TABLET | Freq: Every day | ORAL | Status: DC | PRN
  Filled 2020-01-09: qty 2

## 2020-01-09 NOTE — ED Notes (Signed)
Notified MD, Pt converted herself out of Afib RVR now in NSR, will obtain another EKG

## 2020-01-09 NOTE — Discharge Instructions (Addendum)

## 2020-01-09 NOTE — Consult Note (Addendum)
Cardiology Consultation:   Patient ID: [name] MRN: [id number]; DOB: [birth year]  Admit date: 01/09/2020 Date of Consult: 01/09/2020  Primary Care Provider: Gaynelle Arabian, MD [facility] Cardiologist: Fransico Him, MD  [place] Electrophysiologist:  None    Patient Profile:   [name] is a 74 y.o. female with a hx of CAD status post PCI of LAD/RCA in 2007, hypertension, hyperlipidemia who is being seen today for the evaluation of atrial fibrillation at the request of Dr. Kathrynn Humble  History of Present Illness:   [name] is a 74 year old female with history of CAD status post PCI LAD/RCA in 2007.  Cath also showed 30% mid LAD, 20% OM1, 70% distal left circumflex too small for PCI, 90% acute RV marginal too small for PCI.  Last echocardiogram 10/29/2014 showed EF 60 to 65%, no significant valvular disease.  Reported onset of chest tightness this afternoon around 3pm.  Denies any dyspnea, lightheadedness, or syncope.  Daughter reports that patient had complained of 3-4 similar episodes recently.  On presentation to the ED, noted to be in A. fib with RVR, rates up to 160s.  Initial vital signs notable for BP 142/107, SPO2 96%, pulse 155.  Labs notable for creatinine 0.7, potassium 4.5, hemoglobin 13.7, INR 2.9, TSH 2.2.  EKG shows atrial fibrillation, rate 166, diffuse ST depressions.  Follow-up EKG shows sinus rhythm with rate 80, resolution of ST depressions.  Past Medical History:  Diagnosis Date  . Arthritis   . Coronary artery disease    s/p PCI of LAD/RCA, cath 2007 with 30% mid LAD, 20% OM1, 70% distal left circ too small for PCI, 90% acute RV mariginal too small for PCI  . Depression   . Diastolic dysfunction   . GERD (gastroesophageal reflux disease)   . Headache    hx migraines 32 yrs ago  . Hyperlipemia   . Hypertension   . RLS (restless legs syndrome)     Past Surgical History:  Procedure Laterality Date  . both wrist surgery  1985  .  CARDIAC CATHETERIZATION    . CHOLECYSTECTOMY    . COLONOSCOPY WITH PROPOFOL N/A 08/23/2016   Procedure: COLONOSCOPY WITH PROPOFOL;  Surgeon: Garlan Fair, MD;  Location: [facility];  Service: Endoscopy;  Laterality: N/A;  . CORONARY STENT PLACEMENT  2006   x2  . LUMBAR DISC SURGERY    . ROTATOR CUFF REPAIR Left    shoulder  . TENDON REPAIR Right    ankle  . TOTAL KNEE ARTHROPLASTY Left 12/21/2012   Procedure: LEFT TOTAL KNEE ARTHROPLASTY;  Surgeon: Augustin Schooling, MD;  Location: [place];  Service: Orthopedics;  Laterality: Left;      Inpatient Medications: Scheduled Meds: . apixaban  5 mg Oral BID  . metoprolol tartrate  25 mg Oral Once   Continuous Infusions:  PRN Meds: metoprolol tartrate  Allergies:    Allergies  Allergen Reactions  . Lipitor [Atorvastatin] Other (See Comments)    "does not like to take"  . Other   . Prednisone Other (See Comments)    Can't sleep  . Limbitrol Ds  [Chlordiazepoxide-Amitriptyline] Rash    Social History:   Social History   Socioeconomic History  . Marital status: Legally Separated    Spouse name: [name]  . Number of children: 1  . Years of education: GED  . Highest education level: Not on file  Occupational History  . Occupation: Retired    Fish farm manager: UNEMPLOYED  Tobacco Use  . Smoking status:  Never Smoker  . Smokeless tobacco: Never Used  Vaping Use  . Vaping Use: Never used  Substance and Sexual Activity  . Alcohol use: No  . Drug use: No  . Sexual activity: Not on file  Other Topics Concern  . Not on file  Social History Narrative   Pt lives at home alone.   Caffeine: 2-3 cups of coffee daily   Her daughter manages her medications   Social Determinants of Health   Financial Resource Strain:   . Difficulty of Paying Living Expenses: Not on file  Food Insecurity:   . Worried About Charity fundraiser in the Last Year: Not on file  . Ran Out of Food in the Last Year: Not on file  Transportation Needs:   .  Lack of Transportation (Medical): Not on file  . Lack of Transportation (Non-Medical): Not on file  Physical Activity:   . Days of Exercise per Week: Not on file  . Minutes of Exercise per Session: Not on file  Stress:   . Feeling of Stress : Not on file  Social Connections:   . Frequency of Communication with Friends and Family: Not on file  . Frequency of Social Gatherings with Friends and Family: Not on file  . Attends Religious Services: Not on file  . Active Member of Clubs or Organizations: Not on file  . Attends Archivist Meetings: Not on file  . Marital Status: Not on file  Intimate Partner Violence:   . Fear of Current or Ex-Partner: Not on file  . Emotionally Abused: Not on file  . Physically Abused: Not on file  . Sexually Abused: Not on file    Family History:    Family History  Problem Relation Age of Onset  . Heart Problems Mother   . Dementia Mother   . Heart Problems Father   . Lymphoma Sister   . ALS Sister   . Parkinson's disease Sister   . Dementia Sister   . Lymphoma Sister      ROS:  Please see the history of present illness.   All other ROS reviewed and negative.     Physical Exam/Data:   Vitals:   01/09/20 1645 01/09/20 1700 01/09/20 1715 01/09/20 1800  BP: 126/85 (!) 143/90 (!) 142/96 (!) 129/114  Pulse: (!) 159   90  Resp: (!) 22 20 (!) 26   Temp:      TempSrc:      SpO2: 97%     Weight:      Height:       No intake or output data in the 24 hours ending 01/09/20 1924 Last 3 Weights 01/09/2020 10/17/2018 01/15/2018  Weight (lbs) 215 lb 213 lb 205 lb  Weight (kg) 97.523 kg 96.616 kg 92.987 kg     Body mass index is 36.9 kg/m.  General:  in no acute distress HEENT: normal Neck: no JVD Cardiac:  normal S1, S2; RRR; no murmur  Lungs:  clear to auscultation bilaterally, no wheezing, rhonchi or rales  Abd: soft, nontender, no hepatomegaly  Ext: no edema Musculoskeletal:  No deformities, BUE and BLE strength normal and  equal Skin: warm and dry  Neuro:  CNs 2-12 intact, no focal abnormalities noted Psych:  Normal affect   EKG:  The EKG was personally reviewed and demonstrates:  atrial fibrillation, rate 166, diffuse ST depressions.  Follow-up EKG shows sinus rhythm with rate 80, resolution of ST depressions. Telemetry:  Telemetry was personally reviewed and  demonstrates:  AF with rates 150s initially, currently in NSR with rate 70s  Relevant CV Studies:   Laboratory Data:  High Sensitivity Troponin:  No results for input(s): TROPONINIHS in the last 720 hours.   Chemistry Recent Labs  Lab 01/09/20 1725  NA 140  K 4.5  CL 109  CO2 21*  GLUCOSE 116*  BUN 17  CREATININE 0.71  CALCIUM 10.2  GFRNONAA >60  ANIONGAP 10    No results for input(s): PROT, ALBUMIN, AST, ALT, ALKPHOS, BILITOT in the last 168 hours. Hematology Recent Labs  Lab 01/09/20 1725  WBC 8.4  RBC 4.86  HGB 13.7  HCT 43.4  MCV 89.3  MCH 28.2  MCHC 31.6  RDW 13.2  PLT 229   BNPNo results for input(s): BNP, PROBNP in the last 168 hours.  DDimer No results for input(s): DDIMER in the last 168 hours.   Radiology/Studies:  No results found.   Assessment and Plan:   Atrial fibrillation: New diagnosis.  CHA2DS2-VASc score 3 (hypertension, age, CAD).  Presented with A. fib with RVR, converted to sinus rhythm in ED -Will need to start anticoagulation, but unclear why INR is elevated (?cirrhosis, would check LFTs and RUQ [place]).  Can hold anticoagulation for now while working up coagulopathy -Increase Toprol-XL to 50 mg daily -Recommend discontinuing Plavix to avoid triple therapy as no recent stents placed.  Continue aspirin 81 mg daily -Echocardiogram  CAD: Status post PCI in 2007 to RCA/LAD -Continue aspirin 81 mg daily -Discontinue Plavix as above to avoid triple therapy -Increase Toprol-XL to 50 mg daily as above -Can continue statin if LFTs are normal  Coagulopathy: INR elevated to 2.9, unclear reason.  Check  LFTs, RUQ [place] to evaluate for liver disease.  Monitor INR.   For questions or updates, please contact [place] Please consult [email or web address] for contact info under    Signed, Donato Heinz, MD  01/09/2020 7:24 PM

## 2020-01-09 NOTE — ED Notes (Signed)
Patient transported to Ultrasound 

## 2020-01-09 NOTE — Progress Notes (Addendum)
ANTICOAGULATION CONSULT NOTE - Initial Consult  Pharmacy Consult for Xarelto Indication: atrial fibrillation  Allergies  Allergen Reactions  . Lipitor [Atorvastatin] Other (See Comments)    "does not like to take"  . Other   . Prednisone Other (See Comments)    Can't sleep  . Limbitrol Ds  [Chlordiazepoxide-Amitriptyline] Rash    Patient Measurements: Height: 5\' 4"  (162.6 cm) Weight: 97.5 kg (215 lb) IBW/kg (Calculated) : 54.7  Vital Signs: Temp: 98.4 F (36.9 C) (10/14 1634) Temp Source: Oral (10/14 1634) BP: 129/114 (10/14 1800) Pulse Rate: 90 (10/14 1800)  Labs: Recent Labs    01/09/20 1725  HGB 13.7  HCT 43.4  PLT 229  APTT 35  LABPROT 29.0*  INR 2.9*  CREATININE 0.71    Estimated Creatinine Clearance: 69.9 mL/min (by C-G formula based on SCr of 0.71 mg/dL).   Medical History: Past Medical History:  Diagnosis Date  . Arthritis   . Coronary artery disease    s/p PCI of LAD/RCA, cath 2007 with 30% mid LAD, 20% OM1, 70% distal left circ too small for PCI, 90% acute RV mariginal too small for PCI  . Depression   . Diastolic dysfunction   . GERD (gastroesophageal reflux disease)   . Headache    hx migraines 32 yrs ago  . Hyperlipemia   . Hypertension   . RLS (restless legs syndrome)    Assessment: 61 YOF presenting with CP, found to be in Afib with RVR, no hx of Afib and not on anticoagulation PTA.  CrCl (TBW) > 50 mL/min  Goal of Therapy:  Monitor platelets by anticoagulation protocol: Yes   Plan:  Xarelto 20mg  PO once daily Monitor renal function, s/s bleeding  Addendum: Now consulted for Eliquis, Age<80, Wt >60kg, SCr 0.71 Eliquis 5mg  PO BID  Bertis Ruddy, PharmD Clinical Pharmacist ED Pharmacist Phone # [phone] 01/09/2020 7:09 PM

## 2020-01-09 NOTE — ED Provider Notes (Signed)
[place] EMERGENCY DEPARTMENT Provider Note   CSN: [id number] Arrival date & time: 01/09/20  1626     History Chief Complaint  Patient presents with  . Atrial Fibrillation    [name] is a 74 y.o. female.  HPI     74 year old female comes in a chief complaint of chest pain.  Patient has history of CAD, CHF, hypertension, hyperlipidemia.  Her daughter had come to help her with groceries.  Patient was complaining of chest pain to her therefore EMS was called.  Patient reports that she was having epigastric chest pain, similar to heartburn.  She has had these episodes off and on for the last year or so.  Typically the symptoms resolve, however the episode today was not resolving, which got her concerned.  When EMS arrived they noted that patient was in A. fib with RVR.  Patient denies any palpitations.  She denied any shortness of breath, exertional chest pain, dizziness, near fainting or fainting spells.  Patient denies any recent illnesses.  She takes her medications as prescribed, although daughter thinks that given patient's dementia she could have been missing doses.  Past Medical History:  Diagnosis Date  . Arthritis   . Coronary artery disease    s/p PCI of LAD/RCA, cath 2007 with 30% mid LAD, 20% OM1, 70% distal left circ too small for PCI, 90% acute RV mariginal too small for PCI  . Depression   . Diastolic dysfunction   . GERD (gastroesophageal reflux disease)   . Headache    hx migraines 32 yrs ago  . Hyperlipemia   . Hypertension   . RLS (restless legs syndrome)     Patient Active Problem List   Diagnosis Date Noted  . Dementia with behavioral disturbance ([place]) 10/21/2018  . Insomnia 02/13/2013  . Coronary artery disease   . Hypertension   . Hyperlipemia   . Diastolic dysfunction   . RLS (restless legs syndrome)   . Memory loss 08/29/2012  . TIA (transient ischemic attack) 07/31/2012  . Muscle spasm 07/31/2012    Past  Surgical History:  Procedure Laterality Date  . both wrist surgery  1985  . CARDIAC CATHETERIZATION    . CHOLECYSTECTOMY    . COLONOSCOPY WITH PROPOFOL N/A 08/23/2016   Procedure: COLONOSCOPY WITH PROPOFOL;  Surgeon: Garlan Fair, MD;  Location: [facility];  Service: Endoscopy;  Laterality: N/A;  . CORONARY STENT PLACEMENT  2006   x2  . LUMBAR DISC SURGERY    . ROTATOR CUFF REPAIR Left    shoulder  . TENDON REPAIR Right    ankle  . TOTAL KNEE ARTHROPLASTY Left 12/21/2012   Procedure: LEFT TOTAL KNEE ARTHROPLASTY;  Surgeon: Augustin Schooling, MD;  Location: [place];  Service: Orthopedics;  Laterality: Left;     OB History   No obstetric history on file.     Family History  Problem Relation Age of Onset  . Heart Problems Mother   . Dementia Mother   . Heart Problems Father   . Lymphoma Sister   . ALS Sister   . Parkinson's disease Sister   . Dementia Sister   . Lymphoma Sister     Social History   Tobacco Use  . Smoking status: Never Smoker  . Smokeless tobacco: Never Used  Vaping Use  . Vaping Use: Never used  Substance Use Topics  . Alcohol use: No  . Drug use: No    Home Medications Prior to Admission medications  Medication Sig Start Date End Date Taking? Authorizing Provider  aspirin EC 81 MG tablet Take 81 mg by mouth every evening.    Yes [provider]  Calcium Carbonate-Vitamin D (CALCIUM 600+D) 600-400 MG-UNIT per tablet Take 1 tablet by mouth daily.   Yes [provider]  Carboxymethylcellul-Glycerin (CLEAR EYES FOR DRY EYES OP) Place 1 drop into both eyes 2 (two) times daily.   Yes [provider]  Cholecalciferol (VITAMIN D) 2000 units CAPS Take 2,000 Units by mouth daily.   Yes [provider]  clopidogrel (PLAVIX) 75 MG tablet Take 75 mg by mouth daily.    Yes [provider]  ezetimibe (ZETIA) 10 MG tablet Take 10 mg by mouth every evening.    Yes [provider]  isosorbide mononitrate  (IMDUR) 60 MG 24 hr tablet Take 60 mg by mouth daily.   Yes [provider]  magnesium oxide (MAG-OX) 400 MG tablet Take 400 mg by mouth daily.   Yes [provider]  Melatonin 10 MG TABS Take 1 tablet by mouth daily as needed (for sleep).   Yes [provider]  metoprolol succinate (TOPROL-XL) 25 MG 24 hr tablet Take 25 mg by mouth daily.   Yes [provider]  Omega-3 Fatty Acids (FISH OIL PO) Take 1 capsule by mouth daily.    Yes [provider]  pantoprazole (PROTONIX) 40 MG tablet Take 40 mg by mouth daily.   Yes [provider]  pravastatin (PRAVACHOL) 80 MG tablet Take 80 mg by mouth every evening.   Yes [provider]  rOPINIRole (REQUIP) 0.5 MG tablet Take 0.5-1 mg by mouth 2 (two) times daily. For restless leg syndrome 1 IN THE MORNING AND 2 AT NIGHT   Yes [provider]  sertraline (ZOLOFT) 50 MG tablet Take 50 mg by mouth at bedtime.    Yes [provider]  rosuvastatin (CRESTOR) 20 MG tablet Take 1 tablet (20 mg total) by mouth daily. Patient not taking: Reported on 01/09/2020 09/29/17   Sueanne Margarita, MD    Allergies    Lipitor [atorvastatin], Other, Prednisone, and Limbitrol ds  [chlordiazepoxide-amitriptyline]  Review of Systems   Review of Systems  Constitutional: Positive for activity change.  Respiratory: Negative for shortness of breath.   Cardiovascular: Negative for chest pain.  Gastrointestinal: Negative for nausea and vomiting.  Neurological: Positive for dizziness.  All other systems reviewed and are negative.   Physical Exam Updated Vital Signs BP 124/87   Pulse 85   Temp 98.4 F (36.9 C) (Oral)   Resp (!) 21   Ht 5\' 4"  (1.626 m)   Wt 97.5 kg   SpO2 98%   BMI 36.90 kg/m   Physical Exam Vitals and nursing note reviewed.  Constitutional:      Appearance: She is well-developed.  HENT:     Head: Normocephalic and atraumatic.  Eyes:     Pupils: Pupils are equal,  round, and reactive to light.  Cardiovascular:     Rate and Rhythm: Tachycardia present. Rhythm irregular.     Heart sounds: Normal heart sounds.  Pulmonary:     Effort: Pulmonary effort is normal. No respiratory distress.  Abdominal:     General: There is no distension.     Palpations: Abdomen is soft.     Tenderness: There is no abdominal tenderness. There is no guarding or rebound.  Musculoskeletal:     Cervical back: Neck supple.  Skin:    General: Skin  is warm and dry.  Neurological:     Mental Status: She is alert and oriented to person, place, and time.     ED Results / Procedures / Treatments   Labs (all labs ordered are listed, but only abnormal results are displayed) Labs Reviewed  BASIC METABOLIC PANEL - Abnormal; Notable for the following components:      Result Value   CO2 21 (*)    Glucose, Bld 116 (*)    All other components within normal limits  PROTIME-INR - Abnormal; Notable for the following components:   Prothrombin Time 29.0 (*)    INR 2.9 (*)    All other components within normal limits  RESPIRATORY PANEL BY RT PCR (FLU A&B, COVID)  MAGNESIUM  CBC  APTT  TSH  HEPATIC FUNCTION PANEL    EKG EKG Interpretation  Date/Time:  Thursday January 09 2020 16:33:14 EDT Ventricular Rate:  166 PR Interval:    QRS Duration: 81 QT Interval:  271 QTC Calculation: 451 R Axis:   13 Text Interpretation: Atrial fibrillation with rapid V-rate Left ventricular hypertrophy ST depression, probably rate related afib is new Confirmed by Varney Biles [id number]) on 01/09/2020 7:02:53 PM   EKG Interpretation  Date/Time:  Thursday January 09 2020 18:38:30 EDT Ventricular Rate:  80 PR Interval:    QRS Duration: 77 QT Interval:  361 QTC Calculation: 417 R Axis:   6 Text Interpretation: Sinus rhythm Abnormal R-wave progression, early transition No acute changes No significant change since last tracing Confirmed by Varney Biles ([id number]) on 01/09/2020 7:03:40 PM          Radiology DG Chest Port 1 View  Result Date: 01/09/2020 CLINICAL DATA:  Chest tightness and shortness of breath EXAM: PORTABLE CHEST 1 VIEW COMPARISON:  06/04/2018 FINDINGS: Cardiac shadow is within normal limits. Aortic calcifications are seen. The lungs are well aerated bilaterally. No bony abnormality is noted. IMPRESSION: No active disease. Electronically Signed   By: Inez Catalina M.D.   On: 01/09/2020 19:49    Procedures .Critical Care Performed by: Varney Biles, MD Authorized by: Varney Biles, MD   Critical care provider statement:    Critical care time (minutes):  35   Critical care was necessary to treat or prevent imminent or life-threatening deterioration of the following conditions:  Cardiac failure   Critical care was time spent personally by me on the following activities:  Discussions with consultants, evaluation of patient's response to treatment, examination of patient, ordering and performing treatments and interventions, ordering and review of laboratory studies, ordering and review of radiographic studies, pulse oximetry, re-evaluation of patient's condition, obtaining history from patient or surrogate and review of old charts   (including critical care time)  Medications Ordered in ED Medications  metoprolol tartrate (LOPRESSOR) injection 5 mg (has no administration in time range)  metoprolol tartrate (LOPRESSOR) tablet 25 mg (25 mg Oral Given 01/09/20 1928)    ED Course  I have reviewed the triage vital signs and the nursing notes.  Pertinent labs & imaging results that were available during my care of the patient were reviewed by me and considered in my medical decision making (see chart for details).    MDM Rules/Calculators/A&P                          74 year old female comes in with chief complaint of chest pain.  She has history of CAD.  Chest pain has resolved. Patient is noted with new onset  A. Fib.  Chest pain could be because of demand.   EKG is reassuring besides the RVR.  Appropriate labs ordered.  Reassessment: After 5 mg IV metoprolol the patient's heart rate improved and she converted to sinus rhythm spontaneously.  Mali vas score is greater than 2.  However patient's INR is also over 2.  She is not on any blood thinners.  Discussed case with cardiology.  Because patient is coagulopathic, they are not comfortable starting her on DOAC without coagulopathy work-up.  Medicine has been consulted for admission.    Final Clinical Impression(s) / ED Diagnoses Final diagnoses:  Atrial fibrillation with RVR ([place])  Coagulopathy ([place])    Rx / DC Orders ED Discharge Orders         Ordered    Amb referral to AFIB Clinic        01/09/20 [place], Zachary Lovins, MD 01/09/20 2055

## 2020-01-09 NOTE — H&P (Signed)
History and Physical    [name]:[id number] DOB: October 25, 1945 DOA: 01/09/2020  PCP: Gaynelle Arabian, MD   Patient coming from: Home  Chief Complaint: Palpitations and not feeling well  HPI: [name] is a 74 y.o. female with medical history significant for history of CAD, CHF, hypertension, hyperlipidemia.  Her daughter had come to help her go grocery shopping but when her daughter arrived he was not feeling well.  She states she had a generalized fatigue and weakness with palpitations and a "funny feeling" in her chest.  She states it was not a chest pain or pressure but just felt "funny".  Her daughter called EMS and she was brought to the hospital.  She does report she was having some heartburn earlier in the day states it is now resolved. She has had these episodes off and on for the last year or so but has not had them worked up.  Typically the symptoms resolve, however the episode today was not resolving which concerned her. When EMS arrived they noted that patient was in A. fib with RVR.  Patient denies any palpitations.  She denied any shortness of breath, exertional chest pain, dizziness, near fainting or fainting spells.  ED Course: In the emergency room patient was given metoprolol.  Initial EKG showed atrial fibrillation with RVR.  Repeat EKG after metoprolol showed normal sinus rhythm with no acute ST elevation or depression.  Cardiology was consulted and had recommended starting Eliquis and further outpatient work-up.  Labs revealed an abnormal INR of 2.9 and with his coagulopathy Eliquis was not given and will be held until further work-up is completed.  Echocardiogram has been ordered.  Hospitalist services been asked to manage patient overnight  Review of Systems:  General: Reports generalized weakness earlier.  Denies fever, chills, weight loss, night sweats.  Denies dizziness.  Denies change in appetite HENT: Denies head trauma, headache, denies change in  hearing, tinnitus.  Denies nasal congestion or bleeding.  Denies sore throat, sores in mouth.  Denies difficulty swallowing Eyes: Denies blurry vision, pain in eye, drainage.  Denies discoloration of eyes. Neck: Denies pain.  Denies swelling.  Denies pain with movement. Cardiovascular: Reports palpitations earlier which is now resolved.  Denies chest pain.  Denies edema.  Denies orthopnea Respiratory: Denies shortness of breath, cough.  Denies wheezing.  Denies sputum production Gastrointestinal: Denies abdominal pain, swelling.  Denies nausea, vomiting, diarrhea.  Denies melena.  Denies hematemesis. Musculoskeletal: Denies limitation of movement.  Denies deformity or swelling.  Denies pain.  Denies arthralgias or myalgias. Genitourinary: Denies pelvic pain.  Denies urinary frequency or hesitancy.  Denies dysuria.  Skin: Denies rash.  Denies petechiae, purpura, ecchymosis. Neurological: Denies headache.  Denies syncope.  Denies seizure activity.  Denies weakness or paresthesia.  Denies slurred speech, drooping face.  Denies visual change. Psychiatric: Denies depression, anxiety.  Denies suicidal thoughts or ideation.  Denies hallucinations.  Past Medical History:  Diagnosis Date  . Arthritis   . Coronary artery disease    s/p PCI of LAD/RCA, cath 2007 with 30% mid LAD, 20% OM1, 70% distal left circ too small for PCI, 90% acute RV mariginal too small for PCI  . Depression   . Diastolic dysfunction   . GERD (gastroesophageal reflux disease)   . Headache    hx migraines 32 yrs ago  . Hyperlipemia   . Hypertension   . RLS (restless legs syndrome)     Past Surgical History:  Procedure Laterality Date  .  both wrist surgery  1985  . CARDIAC CATHETERIZATION    . CHOLECYSTECTOMY    . COLONOSCOPY WITH PROPOFOL N/A 08/23/2016   Procedure: COLONOSCOPY WITH PROPOFOL;  Surgeon: Garlan Fair, MD;  Location: [facility];  Service: Endoscopy;  Laterality: N/A;  . CORONARY STENT PLACEMENT  2006     x2  . LUMBAR DISC SURGERY    . ROTATOR CUFF REPAIR Left    shoulder  . TENDON REPAIR Right    ankle  . TOTAL KNEE ARTHROPLASTY Left 12/21/2012   Procedure: LEFT TOTAL KNEE ARTHROPLASTY;  Surgeon: Augustin Schooling, MD;  Location: [facility];  Service: Orthopedics;  Laterality: Left;    Social History  reports that she has never smoked. She has never used smokeless tobacco. She reports that she does not drink alcohol and does not use drugs.  Allergies  Allergen Reactions  . Lipitor [Atorvastatin] Other (See Comments)    "does not like to take"  . Other   . Prednisone Other (See Comments)    Can't sleep  . Limbitrol Ds  [Chlordiazepoxide-Amitriptyline] Rash    Family History  Problem Relation Age of Onset  . Heart Problems Mother   . Dementia Mother   . Heart Problems Father   . Lymphoma Sister   . ALS Sister   . Parkinson's disease Sister   . Dementia Sister   . Lymphoma Sister      Prior to Admission medications   Medication Sig Start Date End Date Taking? Authorizing Provider  aspirin EC 81 MG tablet Take 81 mg by mouth every evening.    Yes [provider]  Calcium Carbonate-Vitamin D (CALCIUM 600+D) 600-400 MG-UNIT per tablet Take 1 tablet by mouth daily.   Yes [provider]  Carboxymethylcellul-Glycerin (CLEAR EYES FOR DRY EYES OP) Place 1 drop into both eyes 2 (two) times daily.   Yes [provider]  Cholecalciferol (VITAMIN D) 2000 units CAPS Take 2,000 Units by mouth daily.   Yes [provider]  clopidogrel (PLAVIX) 75 MG tablet Take 75 mg by mouth daily.    Yes [provider]  ezetimibe (ZETIA) 10 MG tablet Take 10 mg by mouth every evening.    Yes [provider]  isosorbide mononitrate (IMDUR) 60 MG 24 hr tablet Take 60 mg by mouth daily.   Yes [provider]  magnesium oxide (MAG-OX) 400 MG tablet Take 400 mg by mouth daily.   Yes [provider]  Melatonin 10 MG TABS Take 1 tablet by  mouth daily as needed (for sleep).   Yes [provider]  metoprolol succinate (TOPROL-XL) 25 MG 24 hr tablet Take 25 mg by mouth daily.   Yes [provider]  Omega-3 Fatty Acids (FISH OIL PO) Take 1 capsule by mouth daily.    Yes [provider]  pantoprazole (PROTONIX) 40 MG tablet Take 40 mg by mouth daily.   Yes [provider]  pravastatin (PRAVACHOL) 80 MG tablet Take 80 mg by mouth every evening.   Yes [provider]  rOPINIRole (REQUIP) 0.5 MG tablet Take 0.5-1 mg by mouth 2 (two) times daily. For restless leg syndrome 1 IN THE MORNING AND 2 AT NIGHT   Yes [provider]  sertraline (ZOLOFT) 50 MG tablet Take 50 mg by mouth at bedtime.    Yes [provider]  rosuvastatin (CRESTOR) 20 MG tablet Take 1 tablet (20 mg total) by mouth daily. Patient not taking: Reported on 01/09/2020 09/29/17  Sueanne Margarita, MD    Physical Exam: Vitals:   01/09/20 1930 01/09/20 1945 01/09/20 2000 01/09/20 2045  BP: 121/79 124/87 133/88 128/70  Pulse:   81 70  Resp: 20 (!) 21 (!) 22 (!) 24  Temp:      TempSrc:      SpO2:   97% 95%  Weight:      Height:        Constitutional: NAD, calm, comfortable Vitals:   01/09/20 1930 01/09/20 1945 01/09/20 2000 01/09/20 2045  BP: 121/79 124/87 133/88 128/70  Pulse:   81 70  Resp: 20 (!) 21 (!) 22 (!) 24  Temp:      TempSrc:      SpO2:   97% 95%  Weight:      Height:       General: WDWN, Alert and oriented x3.  Eyes: EOMI, PERRL, lids and conjunctivae normal.  Sclera nonicteric HENT:  [place]/AT, external ears normal.  Nares patent without epistasis.  Mucous membranes are moist. Posterior pharynx clear of any exudate or lesions. Neck: Soft, normal range of motion, supple, no masses, no thyromegaly.  Trachea midline Respiratory: clear to auscultation bilaterally, no wheezing, no crackles. Normal respiratory effort. No accessory muscle use.  Cardiovascular: Regular rate and rhythm, no murmurs  / rubs / gallops. No extremity edema. 1+ pedal pulses. No carotid bruits.  Abdomen: Soft, no tenderness, nondistended, no rebound or guarding.  No masses palpated. No hepatosplenomegaly. Bowel sounds normoactive Musculoskeletal: FROM. no clubbing / cyanosis. No joint deformity upper and lower extremities. Normal muscle tone.  Skin: Warm, dry, intact no rashes, lesions, ulcers. No induration Neurologic: CN 2-12 grossly intact.  Normal speech.  Sensation intact, patella DTR +1 bilaterally. Strength 5/5 in all extremities.   Psychiatric: Normal judgment and insight.  Normal mood  CHA2DS2-VASc score is 5   Labs on Admission: I have personally reviewed following labs and imaging studies  CBC: Recent Labs  Lab 01/09/20 1725  WBC 8.4  HGB 13.7  HCT 43.4  MCV 89.3  PLT 294    Basic Metabolic Panel: Recent Labs  Lab 01/09/20 1725  NA 140  K 4.5  CL 109  CO2 21*  GLUCOSE 116*  BUN 17  CREATININE 0.71  CALCIUM 10.2  MG 2.1    GFR: Estimated Creatinine Clearance: 69.9 mL/min (by C-G formula based on SCr of 0.71 mg/dL).  Liver Function Tests: No results for input(s): AST, ALT, ALKPHOS, BILITOT, PROT, ALBUMIN in the last 168 hours.  Urine analysis:    Component Value Date/Time   COLORURINE YELLOW 02/02/2012 1246   APPEARANCEUR CLEAR 02/02/2012 1246   LABSPEC 1.003 (L) 02/02/2012 1246   PHURINE 6.5 02/02/2012 [place] 02/02/2012 1246   HGBUR NEGATIVE 02/02/2012 1246   BILIRUBINUR NEGATIVE 02/02/2012 1246   KETONESUR NEGATIVE 02/02/2012 1246   PROTEINUR NEGATIVE 02/02/2012 1246   UROBILINOGEN 0.2 02/02/2012 1246   NITRITE NEGATIVE 02/02/2012 1246   LEUKOCYTESUR NEGATIVE 02/02/2012 1246    Radiological Exams on Admission: DG Chest Port 1 View  Result Date: 01/09/2020 CLINICAL DATA:  Chest tightness and shortness of breath EXAM: PORTABLE CHEST 1 VIEW COMPARISON:  06/04/2018 FINDINGS: Cardiac shadow is within normal limits. Aortic calcifications are seen.  The lungs are well aerated bilaterally. No bony abnormality is noted. IMPRESSION: No active disease. Electronically Signed   By: Inez Catalina M.D.   On: 01/09/2020 19:49    EKG: Independently reviewed.  EKGs were reviewed.  Initial EKG showed atrial  fibrillation with RVR with a QTC of 451 with nonspecific ST changes.  Second EKG after metoprolol shows normal sinus rhythm with no acute ST elevation or depression.  QTc of 417  Assessment/Plan Principal Problem:   New onset atrial fibrillation ([place]) Ms. Liptak will be observed on cardiac telemetry with new onset atrial fibrillation.  She is now in normal sinus rhythm after receiving metoprolol.  She has been seen by cardiology and is started on oral metoprolol.  Echocardiogram is ordered and pending.  Active Problems:   Coagulopathy ([place]) With elevated INR of 2.9 not on any anticoagulation.  No history of bleeding, bruising or coagulation disorders. APTT was ordered and is normal.  Will order fibrinogen level.  Recheck PT/INR in the morning and if continues to be abnormal will consult hematology    Coronary artery disease Stable.  Continue home medications.    Hypertension Taking metoprolol and Imdur.   DVT prophylaxis: Was initially can be placed on Eliquis for anticoagulation but with her INR elevated at 2.9 this is being held until further work-up.  SCDs provided overnight for DVT prophylaxis Code Status:   Full code Family Communication:  Diagnosis and plan discussed with patient.  Patient verbalized understanding agrees with plan.  Further recommendation follow clinical indicated Disposition Plan:   Patient is from:  Home  Anticipated DC to:  Home  Anticipated DC date:  Anticipate less than 2 midnight stay in the hospital to treat acute medical condition  Anticipated DC barriers: No barriers to discharge identified at this time  Consults called:  Cardiology Admission status:  Observation  Severity of Illness: The appropriate  patient status for this patient is OBSERVATION. Observation status is judged to be reasonable and necessary in order to provide the required intensity of service to ensure the patient's safety. The patient's presenting symptoms, physical exam findings, and initial radiographic and laboratory data in the context of their medical condition is felt to place them at decreased risk for further clinical deterioration. Furthermore, it is anticipated that the patient will be medically stable for discharge from the hospital within 2 midnights of admission. The following factors support the patient status of observation.    Yevonne Aline Haeli Gerlich MD [facility]  How to contact the [facility] Attending or Consulting provider [place] or covering provider during after hours [place], for this patient?   1. Check the care team in [facility] and look for a) attending/consulting [facility] provider listed and b) the [facility] team listed 2. Log into [email or web address] and use [place]'s universal password to access. If you do not have the password, please contact the hospital operator. 3. Locate the [facility] provider you are looking for under [facility] and page to a number that you can be directly reached. 4. If you still have difficulty reaching the provider, please page the [facility] (Director on Call) for the Hospitalists listed on amion for assistance.  01/09/2020, 9:42 PM

## 2020-01-09 NOTE — ED Triage Notes (Signed)
Assume care from EMS, ems reports around 2pm pt reports chest pressure with nausea. EMS states upon arrival s/s had subdue however pt was in Afib RVR with no hx of Afib w/ HR in 150s. EMS states pt has an hx of 2 stents placement and is currently on Plavix.   Vitals  144/88 HR 156 O2 sats 96 % RA  CBG 175

## 2020-01-10 ENCOUNTER — Other Ambulatory Visit: Payer: Self-pay

## 2020-01-10 ENCOUNTER — Observation Stay (HOSPITAL_BASED_OUTPATIENT_CLINIC_OR_DEPARTMENT_OTHER): Payer: Medicare Other

## 2020-01-10 DIAGNOSIS — I4891 Unspecified atrial fibrillation: Secondary | ICD-10-CM

## 2020-01-10 DIAGNOSIS — I5033 Acute on chronic diastolic (congestive) heart failure: Secondary | ICD-10-CM

## 2020-01-10 DIAGNOSIS — I2510 Atherosclerotic heart disease of native coronary artery without angina pectoris: Secondary | ICD-10-CM

## 2020-01-10 DIAGNOSIS — I10 Essential (primary) hypertension: Secondary | ICD-10-CM

## 2020-01-10 LAB — PROTIME-INR
INR: 1.2 (ref 0.8–1.2)
Prothrombin Time: 14.9 seconds (ref 11.4–15.2)

## 2020-01-10 LAB — LIPID PANEL
Cholesterol: 139 mg/dL (ref 0–200)
HDL: 56 mg/dL (ref >40)
LDL Cholesterol: 64 mg/dL (ref 0–99)
Total CHOL/HDL Ratio: 2.5 RATIO
Triglycerides: 95 mg/dL (ref <150)
VLDL: 19 mg/dL (ref 0–40)

## 2020-01-10 LAB — COMPREHENSIVE METABOLIC PANEL
ALT: 21 U/L (ref 0–44)
AST: 35 U/L (ref 15–41)
Albumin: 3.8 g/dL (ref 3.5–5.0)
Alkaline Phosphatase: 59 U/L (ref 38–126)
Anion gap: 9 (ref 5–15)
BUN: 19 mg/dL (ref 8–23)
CO2: 24 mmol/L (ref 22–32)
Calcium: 10.1 mg/dL (ref 8.9–10.3)
Chloride: 107 mmol/L (ref 98–111)
Creatinine, Ser: 0.78 mg/dL (ref 0.44–1.00)
GFR, Estimated: >60 mL/min (ref >60)
Glucose, Bld: 89 mg/dL (ref 70–99)
Potassium: 4.4 mmol/L (ref 3.5–5.1)
Sodium: 140 mmol/L (ref 135–145)
Total Bilirubin: 1 mg/dL (ref 0.3–1.2)
Total Protein: 7.1 g/dL (ref 6.5–8.1)

## 2020-01-10 LAB — CBC
HCT: 41.9 % (ref 36.0–46.0)
Hemoglobin: 13.3 g/dL (ref 12.0–15.0)
MCH: 28.2 pg (ref 26.0–34.0)
MCHC: 31.7 g/dL (ref 30.0–36.0)
MCV: 89 fL (ref 80.0–100.0)
Platelets: 234 10*3/uL (ref 150–400)
RBC: 4.71 MIL/uL (ref 3.87–5.11)
RDW: 13.2 % (ref 11.5–15.5)
WBC: 9.4 10*3/uL (ref 4.0–10.5)
nRBC: 0 % (ref 0.0–0.2)

## 2020-01-10 LAB — FIBRINOGEN: Fibrinogen: 352 mg/dL (ref 210–475)

## 2020-01-10 LAB — ECHOCARDIOGRAM COMPLETE
Area-P 1/2: 2.71 cm2
Height: 64 in
S' Lateral: 2.7 cm
Weight: 3442.7 oz

## 2020-01-10 MED ORDER — APIXABAN 5 MG PO TABS
5.0000 mg | ORAL_TABLET | Freq: Two times a day (BID) | ORAL | Status: DC
  Administered 2020-01-10: 5 mg via ORAL
  Filled 2020-01-10: qty 1

## 2020-01-10 MED ORDER — FUROSEMIDE 20 MG PO TABS
20.0000 mg | ORAL_TABLET | Freq: Once | ORAL | Status: AC
  Administered 2020-01-10: 20 mg via ORAL
  Filled 2020-01-10: qty 1

## 2020-01-10 MED ORDER — APIXABAN 5 MG PO TABS
5.0000 mg | ORAL_TABLET | Freq: Two times a day (BID) | ORAL | 0 refills | Status: DC
Start: 2020-01-10 — End: 2020-02-10

## 2020-01-10 MED ORDER — INFLUENZA VAC A&B SA ADJ QUAD 0.5 ML IM PRSY: 0.5000 mL | PREFILLED_SYRINGE | INTRAMUSCULAR | Status: DC

## 2020-01-10 NOTE — Plan of Care (Signed)

## 2020-01-10 NOTE — Progress Notes (Signed)
Echocardiogram 2D Echocardiogram has been performed.  [place] Deputy Sherisa Gilvin 01/10/2020, 8:55 AM

## 2020-01-10 NOTE — Progress Notes (Signed)
Progress Note  Patient Name: [name] Date of Encounter: 01/10/2020  [facility] Cardiologist: Fransico Him, MD   Subjective   Maintaining sinus rhythm.  Feeling well.  Denies chest pain or shortness of breath.  She felt palpitations when in atrial fibrillation.  Notes that her brother has afib.   Inpatient Medications    Scheduled Meds: . aspirin EC  81 mg Oral QPM  . ezetimibe  10 mg Oral QPM  . [START ON 01/11/2020] influenza vaccine adjuvanted  0.5 mL Intramuscular Tomorrow-1000  . isosorbide mononitrate  60 mg Oral Daily  . magnesium oxide  400 mg Oral Daily  . metoprolol succinate  25 mg Oral Daily  . pantoprazole  40 mg Oral Daily  . pravastatin  80 mg Oral QPM  . rOPINIRole  0.5 mg Oral Daily  . rOPINIRole  1 mg Oral QHS  . sertraline  50 mg Oral QHS   Continuous Infusions:  PRN Meds: acetaminophen, melatonin, metoprolol tartrate, ondansetron (ZOFRAN) IV   Vital Signs    Vitals:   01/10/20 0343 01/10/20 0700 01/10/20 0753 01/10/20 0800  BP: 128/88  (!) 159/95   Pulse: 62  63   Resp:  15 16 (!) 22  Temp: 98.2 F (36.8 C)  98.1 F (36.7 C)   TempSrc: Oral  Oral   SpO2: 95%  97%   Weight: 97.6 kg     Height:       No intake or output data in the 24 hours ending 01/10/20 0934 Last 3 Weights 01/10/2020 01/10/2020 01/09/2020  Weight (lbs) 215 lb 2.7 oz 215 lb 3.2 oz 215 lb  Weight (kg) 97.6 kg 97.614 kg 97.523 kg      Telemetry    Sinus rhythm in the 60s - Personally Reviewed  ECG    No new tracings - Personally Reviewed  Physical Exam   VS:  BP (!) 159/95 (BP Location: Left Arm)   Pulse 63   Temp 98.1 F (36.7 C) (Oral)   Resp (!) 31   Ht 5\' 4"  (1.626 m)   Wt 97.6 kg   SpO2 97%   BMI 36.93 kg/m  , BMI Body mass index is 36.93 kg/m. GENERAL:  Well appearing HEENT: Pupils equal round and reactive, fundi not visualized, oral mucosa unremarkable NECK:  No jugular venous distention, waveform within normal limits, carotid  upstroke brisk and symmetric, no bruits LUNGS:  Clear to auscultation bilaterally HEART:  RRR.  PMI not displaced or sustained,S1 and S2 within normal limits, no S3, no S4, no clicks, no rubs, no murmurs ABD:  Flat, positive bowel sounds normal in frequency in pitch, no bruits, no rebound, no guarding, no midline pulsatile mass, no hepatomegaly, no splenomegaly EXT:  2 plus pulses throughout, no edema, no cyanosis no clubbing SKIN:  No rashes no nodules NEURO:  Cranial nerves II through XII grossly intact, motor grossly intact throughout PSYCH:  Cognitively intact, oriented to person place and time   Labs    High Sensitivity Troponin:  No results for input(s): TROPONINIHS in the last 720 hours.    Chemistry Recent Labs  Lab 01/09/20 1725 01/09/20 2112 01/10/20 0054  NA 140  --  140  K 4.5  --  4.4  CL 109  --  107  CO2 21*  --  24  GLUCOSE 116*  --  89  BUN 17  --  19  CREATININE 0.71  --  0.78  CALCIUM 10.2  --  10.1  PROT  --  7.0 7.1  ALBUMIN  --  4.0 3.8  AST  --  42* 35  ALT  --  20 21  ALKPHOS  --  63 59  BILITOT  --  1.1 1.0  GFRNONAA >60  --  >60  ANIONGAP 10  --  9     Hematology Recent Labs  Lab 01/09/20 1725 01/10/20 0054  WBC 8.4 9.4  RBC 4.86 4.71  HGB 13.7 13.3  HCT 43.4 41.9  MCV 89.3 89.0  MCH 28.2 28.2  MCHC 31.6 31.7  RDW 13.2 13.2  PLT 229 234    BNPNo results for input(s): BNP, PROBNP in the last 168 hours.   DDimer No results for input(s): DDIMER in the last 168 hours.   Radiology    DG Chest Port 1 View  Result Date: 01/09/2020 CLINICAL DATA:  Chest tightness and shortness of breath EXAM: PORTABLE CHEST 1 VIEW COMPARISON:  06/04/2018 FINDINGS: Cardiac shadow is within normal limits. Aortic calcifications are seen. The lungs are well aerated bilaterally. No bony abnormality is noted. IMPRESSION: No active disease. Electronically Signed   By: Inez Catalina M.D.   On: 01/09/2020 19:49   ECHOCARDIOGRAM COMPLETE  Result Date:  01/10/2020    ECHOCARDIOGRAM REPORT   Patient Name:   [name] Date of Exam: 01/10/2020 Medical Rec #:  [id number]         Height:       64.0 in Accession #:    [id number]        Weight:       215.2 lb Date of Birth:  12-Aug-1945        BSA:          2.018 m Patient Age:    74 years          BP:           128/88 mmHg Patient Gender: F                 HR:           62 bpm. Exam Location:  Inpatient Procedure: 2D Echo, Color Doppler and Cardiac Doppler Indications:    I48.91* Unspecified atrial fibrillation  History:        Patient has prior history of Echocardiogram examinations, most                 recent 10/29/2014. CAD, Arrythmias:Atrial Fibrillation; Risk                 Factors:Hypertension and Dyslipidemia.  Sonographer:    Raquel Sarna Senior RDCS Referring Phys: [id number] Donato Heinz  Sonographer Comments: Technically difficult due to patient moving (RLS). IMPRESSIONS  1. Left ventricular ejection fraction, by estimation, is 60 to 65%. The left ventricle has normal function. The left ventricle has no regional wall motion abnormalities. There is moderate left ventricular hypertrophy. Left ventricular diastolic parameters are consistent with Grade I diastolic dysfunction (impaired relaxation).  2. Right ventricular systolic function is normal. The right ventricular size is normal. Tricuspid regurgitation signal is inadequate for assessing PA pressure.  3. The mitral valve is normal in structure. Trivial mitral valve regurgitation. No evidence of mitral stenosis.  4. The aortic valve is normal in structure. Aortic valve regurgitation is not visualized. No aortic stenosis is present.  5. The inferior vena cava is normal in size with greater than 50% respiratory variability, suggesting right atrial pressure of 3 mmHg. FINDINGS  Left Ventricle: Left ventricular ejection fraction,  by estimation, is 60 to 65%. The left ventricle has normal function. The left ventricle has no regional wall motion  abnormalities. The left ventricular internal cavity size was normal in size. There is  moderate left ventricular hypertrophy. Left ventricular diastolic parameters are consistent with Grade I diastolic dysfunction (impaired relaxation). Right Ventricle: The right ventricular size is normal. No increase in right ventricular wall thickness. Right ventricular systolic function is normal. Tricuspid regurgitation signal is inadequate for assessing PA pressure. Left Atrium: Left atrial size was normal in size. Right Atrium: Right atrial size was normal in size. Pericardium: There is no evidence of pericardial effusion. Mitral Valve: The mitral valve is normal in structure. Mild to moderate mitral annular calcification. Trivial mitral valve regurgitation. No evidence of mitral valve stenosis. Tricuspid Valve: The tricuspid valve is normal in structure. Tricuspid valve regurgitation is trivial. No evidence of tricuspid stenosis. Aortic Valve: The aortic valve is normal in structure. Aortic valve regurgitation is not visualized. No aortic stenosis is present. Pulmonic Valve: The pulmonic valve was normal in structure. Pulmonic valve regurgitation is trivial. No evidence of pulmonic stenosis. Aorta: The aortic root and ascending aorta are structurally normal, with no evidence of dilitation. Venous: The inferior vena cava is normal in size with greater than 50% respiratory variability, suggesting right atrial pressure of 3 mmHg. IAS/Shunts: The interatrial septum appears to be lipomatous. No atrial level shunt detected by color flow Doppler.  LEFT VENTRICLE PLAX 2D LVIDd:         4.60 cm  Diastology LVIDs:         2.70 cm  LV e' medial:    6.31 cm/s LV PW:         1.40 cm  LV E/e' medial:  16.8 LV IVS:        1.40 cm  LV e' lateral:   9.36 cm/s LVOT diam:     2.00 cm  LV E/e' lateral: 11.3 LV SV:         85 LV SV Index:   42 LVOT Area:     3.14 cm  RIGHT VENTRICLE             IVC RV S prime:     13.50 cm/s  IVC diam: 1.90 cm  TAPSE (M-mode): 3.2 cm LEFT ATRIUM             Index       RIGHT ATRIUM           Index LA diam:        3.20 cm 1.59 cm/m  RA Area:     14.10 cm LA Vol (A2C):   41.8 ml 20.71 ml/m RA Volume:   32.00 ml  15.86 ml/m LA Vol (A4C):   36.6 ml 18.14 ml/m LA Biplane Vol: 39.7 ml 19.67 ml/m  AORTIC VALVE LVOT Vmax:   129.00 cm/s LVOT Vmean:  70.000 cm/s LVOT VTI:    0.272 m  AORTA Ao Root diam: 3.30 cm Ao Asc diam:  3.40 cm MITRAL VALVE MV Area (PHT): 2.71 cm     SHUNTS MV Decel Time: 280 msec     Systemic VTI:  0.27 m MV E velocity: 106.00 cm/s  Systemic Diam: 2.00 cm MV A velocity: 112.00 cm/s MV E/A ratio:  0.95 Cherlynn Kaiser MD Electronically signed by Cherlynn Kaiser MD Signature Date/Time: 01/10/2020/9:31:26 AM    Final    US Abdomen Limited RUQ (LIVER/GB)  Result Date: 01/09/2020 CLINICAL DATA:  Abnormal liver function tests EXAM:  ULTRASOUND ABDOMEN LIMITED RIGHT UPPER QUADRANT COMPARISON:  None. FINDINGS: Gallbladder: Absent Common bile duct: Diameter: 5 mm in proximal diameter Liver: The hepatic parenchyma demonstrates diffusely increased parenchymal echogenicity and slight coarsening of the parenchymal echotexture in keeping with changes of at least moderate hepatic steatosis. No focal intrahepatic masses are seen. There is no intrahepatic biliary ductal dilation. Liver size is within normal limits. Portal vein is patent on color Doppler imaging with normal direction of blood flow towards the liver. Other: No ascites IMPRESSION: Moderate hepatic steatosis. Status post cholecystectomy Electronically Signed   By: Fidela Salisbury MD   On: 01/09/2020 22:16    Cardiac Studies   Echo 01/10/20: 1. Left ventricular ejection fraction, by estimation, is 60 to 65%. The  left ventricle has normal function. The left ventricle has no regional  wall motion abnormalities. There is moderate left ventricular hypertrophy.  Left ventricular diastolic  parameters are consistent with Grade I diastolic dysfunction  (impaired  relaxation).  2. Right ventricular systolic function is normal. The right ventricular  size is normal. Tricuspid regurgitation signal is inadequate for assessing  PA pressure.  3. The mitral valve is normal in structure. Trivial mitral valve  regurgitation. No evidence of mitral stenosis.  4. The aortic valve is normal in structure. Aortic valve regurgitation is  not visualized. No aortic stenosis is present.  5. The inferior vena cava is normal in size with greater than 50%  respiratory variability, suggesting right atrial pressure of 3 mmHg.   Patient Profile     74 y.o. female with a hx of CAD status post PCI of LAD/RCA in 2007, hypertension, hyperlipidemia who is being seen today for the evaluation of atrial fibrillation.  Assessment & Plan    Atrial fibrillation - new diagnosis - converted to sinus rhythm in the ER.  If she has recurrent atrial fibrillation she will likely need antiarrhythmics, as her HR is in the 50-60s in sinus. - remains in sinus rhythm in the 60s - toprol 25 mg which is her home dose - INR 1.2, LFTs  This patients CHA2DS2-VASc Score and unadjusted Ischemic Stroke Rate (% per year) is equal to 7.2 % stroke rate/year from a score of 5 (age1, female, HTN, CAD) - Start Eliquis 5mg  bid. - Consider outpatient sleep study.  # Elevated INR:  INR was initially 2.9.  However this AM on repeat it was 1.2.  Suspect lab error.  Recommend anticoagulation as above.   Hypertension - continue BB and imdur - LVH on echo  CAD s/p PCI 2007 to RCA and LAD - continue ASA - discontinue plavix in the setting of need for anticoagulation - continue BB, imdur   Hyperlipidemia with LDL goal < 70 01/10/2020: Cholesterol 139; HDL 56; LDL Cholesterol 64; Triglycerides 95; VLDL 19 Continue statin and zetia, LFTs normal      For questions or updates, please contact [facility] Please consult [email or web address] for contact info under        Signed, Ledora Bottcher, PA  01/10/2020, 9:34 AM

## 2020-01-10 NOTE — Plan of Care (Signed)
Problem: Education: Goal: Knowledge of disease or condition will improve 01/10/2020 1253 by Camillia Herter, RN Outcome: Adequate for Discharge 01/10/2020 0834 by Camillia Herter, RN Outcome: Progressing 01/10/2020 0756 by Camillia Herter, RN Outcome: Progressing Goal: Understanding of medication regimen will improve 01/10/2020 1253 by Camillia Herter, RN Outcome: Adequate for Discharge 01/10/2020 0834 by Camillia Herter, RN Outcome: Progressing 01/10/2020 0756 by Camillia Herter, RN Outcome: Progressing Goal: Individualized Educational Video(s) 01/10/2020 1253 by Camillia Herter, RN Outcome: Adequate for Discharge 01/10/2020 0834 by Camillia Herter, RN Outcome: Progressing 01/10/2020 0756 by Camillia Herter, RN Outcome: Progressing   Problem: Activity: Goal: Ability to tolerate increased activity will improve 01/10/2020 1253 by Camillia Herter, RN Outcome: Adequate for Discharge 01/10/2020 0834 by Camillia Herter, RN Outcome: Progressing 01/10/2020 0756 by Camillia Herter, RN Outcome: Progressing   Problem: Cardiac: Goal: Ability to achieve and maintain adequate cardiopulmonary perfusion will improve 01/10/2020 1253 by Camillia Herter, RN Outcome: Adequate for Discharge 01/10/2020 0834 by Camillia Herter, RN Outcome: Progressing 01/10/2020 0756 by Camillia Herter, RN Outcome: Progressing   Problem: Health Behavior/Discharge Planning: Goal: Ability to safely manage health-related needs after discharge will improve 01/10/2020 1253 by Camillia Herter, RN Outcome: Adequate for Discharge 01/10/2020 0834 by Camillia Herter, RN Outcome: Progressing 01/10/2020 0756 by Camillia Herter, RN Outcome: Progressing   Problem: Education: Goal: Knowledge of General Education information will improve Description: Including pain rating scale, medication(s)/side effects and non-pharmacologic comfort measures 01/10/2020 1253 by Camillia Herter, RN Outcome: Adequate for Discharge 01/10/2020 0834 by  Camillia Herter, RN Outcome: Progressing 01/10/2020 0756 by Camillia Herter, RN Outcome: Progressing   Problem: Health Behavior/Discharge Planning: Goal: Ability to manage health-related needs will improve 01/10/2020 1253 by Camillia Herter, RN Outcome: Adequate for Discharge 01/10/2020 0834 by Camillia Herter, RN Outcome: Progressing 01/10/2020 0756 by Camillia Herter, RN Outcome: Progressing   Problem: Clinical Measurements: Goal: Ability to maintain clinical measurements within normal limits will improve 01/10/2020 1253 by Camillia Herter, RN Outcome: Adequate for Discharge 01/10/2020 0834 by Camillia Herter, RN Outcome: Progressing 01/10/2020 0756 by Camillia Herter, RN Outcome: Progressing Goal: Will remain free from infection 01/10/2020 1253 by Camillia Herter, RN Outcome: Adequate for Discharge 01/10/2020 0834 by Camillia Herter, RN Outcome: Progressing 01/10/2020 0756 by Camillia Herter, RN Outcome: Progressing Goal: Diagnostic test results will improve 01/10/2020 1253 by Camillia Herter, RN Outcome: Adequate for Discharge 01/10/2020 0834 by Camillia Herter, RN Outcome: Progressing 01/10/2020 0756 by Camillia Herter, RN Outcome: Progressing Goal: Respiratory complications will improve 01/10/2020 1253 by Camillia Herter, RN Outcome: Adequate for Discharge 01/10/2020 0834 by Camillia Herter, RN Outcome: Progressing 01/10/2020 0756 by Camillia Herter, RN Outcome: Progressing Goal: Cardiovascular complication will be avoided 01/10/2020 1253 by Camillia Herter, RN Outcome: Adequate for Discharge 01/10/2020 0834 by Camillia Herter, RN Outcome: Progressing 01/10/2020 0756 by Camillia Herter, RN Outcome: Progressing   Problem: Activity: Goal: Risk for activity intolerance will decrease 01/10/2020 1253 by Camillia Herter, RN Outcome: Adequate for Discharge 01/10/2020 0834 by Camillia Herter, RN Outcome: Progressing 01/10/2020 0756 by Camillia Herter, RN Outcome: Progressing    Problem: Nutrition: Goal: Adequate nutrition will be maintained 01/10/2020 1253 by Camillia Herter, RN Outcome: Adequate for Discharge 01/10/2020 0834 by Camillia Herter, RN Outcome: Progressing 01/10/2020 0756 by Camillia Herter, RN Outcome: Progressing  Problem: Coping: Goal: Level of anxiety will decrease 01/10/2020 1253 by Camillia Herter, RN Outcome: Adequate for Discharge 01/10/2020 0834 by Camillia Herter, RN Outcome: Progressing 01/10/2020 0756 by Camillia Herter, RN Outcome: Progressing   Problem: Elimination: Goal: Will not experience complications related to bowel motility 01/10/2020 1253 by Camillia Herter, RN Outcome: Adequate for Discharge 01/10/2020 0834 by Camillia Herter, RN Outcome: Progressing 01/10/2020 0756 by Camillia Herter, RN Outcome: Progressing Goal: Will not experience complications related to urinary retention 01/10/2020 1253 by Camillia Herter, RN Outcome: Adequate for Discharge 01/10/2020 0834 by Camillia Herter, RN Outcome: Progressing 01/10/2020 0756 by Camillia Herter, RN Outcome: Progressing   Problem: Pain Managment: Goal: General experience of comfort will improve 01/10/2020 1253 by Camillia Herter, RN Outcome: Adequate for Discharge 01/10/2020 0834 by Camillia Herter, RN Outcome: Progressing 01/10/2020 0756 by Camillia Herter, RN Outcome: Progressing   Problem: Safety: Goal: Ability to remain free from injury will improve 01/10/2020 1253 by Camillia Herter, RN Outcome: Adequate for Discharge 01/10/2020 0834 by Camillia Herter, RN Outcome: Progressing 01/10/2020 0756 by Camillia Herter, RN Outcome: Progressing   Problem: Skin Integrity: Goal: Risk for impaired skin integrity will decrease 01/10/2020 1253 by Camillia Herter, RN Outcome: Adequate for Discharge 01/10/2020 0834 by Camillia Herter, RN Outcome: Progressing 01/10/2020 0756 by Camillia Herter, RN Outcome: Progressing

## 2020-01-10 NOTE — Discharge Summary (Signed)
Physician Discharge Summary  [name]:[id number] DOB: 1945-08-13 DOA: 01/09/2020  PCP: Gaynelle Arabian, MD  Admit date: 01/09/2020 Discharge date: 01/10/2020  Admitted From: Home  Disposition:  Home   Recommendations for Outpatient Follow-up and new medication changes:  1. Follow up with Dr. Marisue Humble in 7 days.  2. Follow up with Cardiology as scheduled. 3. Discontinue aspirin and clopidogrel 4. Patient started on apixaban for anticoagulation.   Home Health: no   Equipment/Devices: no    Discharge Condition: stable  CODE STATUS: full  Diet recommendation: heart healthy   Brief/Interim Summary: [name] was admitted to the hospital with the working diagnosis of new onset atrial fibrillation.  74 year old female with past medical history for coronary artery disease, heart failure, hypertension and dyslipidemia.  Patient developed sudden onset of palpitations, associated with generalized fatigue and weakness.  No chest pain.  EMS was called and she was found in atrial fibrillation with rapid ventricular response.  On her initial physical examination her temperature was 98.4, blood pressure 142/107, heart rate 155, respiratory rate 24, oxygen saturation 96% on room air.  Patient received intravenous metoprolol converted into sinus rhythm, rate of 80 bpm, her lungs were clear to auscultation bilaterally, heart S1-S2, present rhythmic, soft abdomen, no lower extremity edema. Sodium 140, potassium 4.5, chloride 109, bicarb 21, glucose 116, BUN 17, creatinine 0.71, magnesium 2.1, white count 8.4, hemoglobin 13.7, hematocrit 43.4, platelets 229.  INR 2.9.  TSH 2.2.  SARS COVID-19 negative.  Urinalysis specific gravity 1.003. Chest radiograph with mild cardiomegaly, positive hilar vascular congestion bilaterally and interstitial infiltrates.  EKG: #1 166 bpm, normal axis, normal QTC, atrial fibrillation rhythm, poor R wave progression, ST segment depression V4 through V6, no  significant T wave changes.  #2 80 bpm, normal axis, normal intervals, sinus rhythm, poor R wave progression, no significant ST segment or T wave changes.  Patient was admitted to the telemetry ward, she maintained sinus rhythm.  Patient will be discharged home follow-up with cardiology as an outpatient.  1.  New onset atrial fibrillation with rapid ventricular response, complicated with acute on chronic diastolic heart failure exacerbation. Patient has been maintaining sinus rhythm.  Further work-up with echocardiography showed preserved LV systolic function, 60 to 01%. No regional wall motion normalities, right ventricle systolic function normal. No significant valvular disease.  Plan to continue AV blockade with metoprolol succinate 25 mg daily, her CHA2DS2-VASc score is 5, and she has been started on apixaban for antithrombotic therapy.  Today on her physical examination she does have mild rales at bases, her chest radiograph is suggestive of pulmonary edema, acute cardiogenic, will give patient 20 mg of furosemide x1 before discharge. Currently she is on room air oxygenation 95%.  2. Coronary artery disease. No angina. Continue statin, will hold on asa and clopidogrel now that patient is on apixaban.   3. Hypertension. Blood pressure has been well controlled, continue metoprolol and isosorbide.  4. Dyslipidemia. Cholesterol 139, HDL 56, LDL 64, triglycerides 95, continue Zetia and statin therapy.  5. Depression/ restless leg syndrome. Continue with sertraline and ropinarole.    Patient had falsely elevated INR, ruled out for coagulopathy. Repeat INR 1.2.  Discharge Diagnoses:  Principal Problem:   New onset atrial fibrillation [facility]) Active Problems:   Coronary artery disease   Hypertension   Hyperlipemia   Atrial fibrillation, new onset (HCC)   Acute on chronic diastolic CHF (congestive heart failure) [facility])    Discharge Instructions  Discharge Instructions    Amb  referral  to [facility]   Complete by: As directed      Allergies as of 01/10/2020      Reactions   Lipitor [atorvastatin] Other (See Comments)   "does not like to take"   Other    Prednisone Other (See Comments)   Can't sleep   Limbitrol Ds  [chlordiazepoxide-amitriptyline] Rash      Medication List    STOP taking these medications   aspirin EC 81 MG tablet   clopidogrel 75 MG tablet Commonly known as: PLAVIX   rosuvastatin 20 MG tablet Commonly known as: CRESTOR     TAKE these medications   apixaban 5 MG Tabs tablet Commonly known as: ELIQUIS Take 1 tablet (5 mg total) by mouth 2 (two) times daily.   Calcium 600+D 600-400 MG-UNIT tablet Generic drug: Calcium Carbonate-Vitamin D Take 1 tablet by mouth daily.   CLEAR EYES FOR DRY EYES OP Place 1 drop into both eyes 2 (two) times daily.   ezetimibe 10 MG tablet Commonly known as: ZETIA Take 10 mg by mouth every evening.   FISH OIL PO Take 1 capsule by mouth daily.   isosorbide mononitrate 60 MG 24 hr tablet Commonly known as: IMDUR Take 60 mg by mouth daily.   magnesium oxide 400 MG tablet Commonly known as: MAG-OX Take 400 mg by mouth daily.   Melatonin 10 MG Tabs Take 1 tablet by mouth daily as needed (for sleep).   metoprolol succinate 25 MG 24 hr tablet Commonly known as: TOPROL-XL Take 25 mg by mouth daily.   pantoprazole 40 MG tablet Commonly known as: PROTONIX Take 40 mg by mouth daily.   pravastatin 80 MG tablet Commonly known as: PRAVACHOL Take 80 mg by mouth every evening.   rOPINIRole 0.5 MG tablet Commonly known as: REQUIP Take 0.5-1 mg by mouth 2 (two) times daily. For restless leg syndrome 1 IN THE MORNING AND 2 AT NIGHT   sertraline 50 MG tablet Commonly known as: ZOLOFT Take 50 mg by mouth at bedtime.   Vitamin D 50 MCG (2000 UT) Caps Take 2,000 Units by mouth daily.       Allergies  Allergen Reactions  . Lipitor [Atorvastatin] Other (See Comments)    "does not like to take"   . Other   . Prednisone Other (See Comments)    Can't sleep  . Limbitrol Ds  [Chlordiazepoxide-Amitriptyline] Rash    Consultations:  Cardiology    Procedures/Studies: DG Chest Port 1 View  Result Date: 01/09/2020 CLINICAL DATA:  Chest tightness and shortness of breath EXAM: PORTABLE CHEST 1 VIEW COMPARISON:  06/04/2018 FINDINGS: Cardiac shadow is within normal limits. Aortic calcifications are seen. The lungs are well aerated bilaterally. No bony abnormality is noted. IMPRESSION: No active disease. Electronically Signed   By: Inez Catalina M.D.   On: 01/09/2020 19:49   ECHOCARDIOGRAM COMPLETE  Result Date: 01/10/2020    ECHOCARDIOGRAM REPORT   Patient Name:   SHEBA WHALING Date of Exam: 01/10/2020 Medical Rec #:  [id number]         Height:       64.0 in Accession #:    [id number]        Weight:       215.2 lb Date of Birth:  [birth year]        BSA:          2.018 m Patient Age:    74 years          BP:  128/88 mmHg Patient Gender: F                 HR:           62 bpm. Exam Location:  Inpatient Procedure: 2D Echo, Color Doppler and Cardiac Doppler Indications:    I48.91* Unspecified atrial fibrillation  History:        Patient has prior history of Echocardiogram examinations, most                 recent 10/29/2014. CAD, Arrythmias:Atrial Fibrillation; Risk                 Factors:Hypertension and Dyslipidemia.  Sonographer:    Raquel Sarna Senior RDCS Referring Phys: [id number] Donato Heinz  Sonographer Comments: Technically difficult due to patient moving (RLS). IMPRESSIONS  1. Left ventricular ejection fraction, by estimation, is 60 to 65%. The left ventricle has normal function. The left ventricle has no regional wall motion abnormalities. There is moderate left ventricular hypertrophy. Left ventricular diastolic parameters are consistent with Grade I diastolic dysfunction (impaired relaxation).  2. Right ventricular systolic function is normal. The right ventricular size is  normal. Tricuspid regurgitation signal is inadequate for assessing PA pressure.  3. The mitral valve is normal in structure. Trivial mitral valve regurgitation. No evidence of mitral stenosis.  4. The aortic valve is normal in structure. Aortic valve regurgitation is not visualized. No aortic stenosis is present.  5. The inferior vena cava is normal in size with greater than 50% respiratory variability, suggesting right atrial pressure of 3 mmHg. FINDINGS  Left Ventricle: Left ventricular ejection fraction, by estimation, is 60 to 65%. The left ventricle has normal function. The left ventricle has no regional wall motion abnormalities. The left ventricular internal cavity size was normal in size. There is  moderate left ventricular hypertrophy. Left ventricular diastolic parameters are consistent with Grade I diastolic dysfunction (impaired relaxation). Right Ventricle: The right ventricular size is normal. No increase in right ventricular wall thickness. Right ventricular systolic function is normal. Tricuspid regurgitation signal is inadequate for assessing PA pressure. Left Atrium: Left atrial size was normal in size. Right Atrium: Right atrial size was normal in size. Pericardium: There is no evidence of pericardial effusion. Mitral Valve: The mitral valve is normal in structure. Mild to moderate mitral annular calcification. Trivial mitral valve regurgitation. No evidence of mitral valve stenosis. Tricuspid Valve: The tricuspid valve is normal in structure. Tricuspid valve regurgitation is trivial. No evidence of tricuspid stenosis. Aortic Valve: The aortic valve is normal in structure. Aortic valve regurgitation is not visualized. No aortic stenosis is present. Pulmonic Valve: The pulmonic valve was normal in structure. Pulmonic valve regurgitation is trivial. No evidence of pulmonic stenosis. Aorta: The aortic root and ascending aorta are structurally normal, with no evidence of dilitation. Venous: The  inferior vena cava is normal in size with greater than 50% respiratory variability, suggesting right atrial pressure of 3 mmHg. IAS/Shunts: The interatrial septum appears to be lipomatous. No atrial level shunt detected by color flow Doppler.  LEFT VENTRICLE PLAX 2D LVIDd:         4.60 cm  Diastology LVIDs:         2.70 cm  LV e' medial:    6.31 cm/s LV PW:         1.40 cm  LV E/e' medial:  16.8 LV IVS:        1.40 cm  LV e' lateral:   9.36 cm/s LVOT diam:  2.00 cm  LV E/e' lateral: 11.3 LV SV:         85 LV SV Index:   42 LVOT Area:     3.14 cm  RIGHT VENTRICLE             IVC RV S prime:     13.50 cm/s  IVC diam: 1.90 cm TAPSE (M-mode): 3.2 cm LEFT ATRIUM             Index       RIGHT ATRIUM           Index LA diam:        3.20 cm 1.59 cm/m  RA Area:     14.10 cm LA Vol (A2C):   41.8 ml 20.71 ml/m RA Volume:   32.00 ml  15.86 ml/m LA Vol (A4C):   36.6 ml 18.14 ml/m LA Biplane Vol: 39.7 ml 19.67 ml/m  AORTIC VALVE LVOT Vmax:   129.00 cm/s LVOT Vmean:  70.000 cm/s LVOT VTI:    0.272 m  AORTA Ao Root diam: 3.30 cm Ao Asc diam:  3.40 cm MITRAL VALVE MV Area (PHT): 2.71 cm     SHUNTS MV Decel Time: 280 msec     Systemic VTI:  0.27 m MV E velocity: 106.00 cm/s  Systemic Diam: 2.00 cm MV A velocity: 112.00 cm/s MV E/A ratio:  0.95 Cherlynn Kaiser MD Electronically signed by Cherlynn Kaiser MD Signature Date/Time: 01/10/2020/9:31:26 AM    Final    US Abdomen Limited RUQ (LIVER/GB)  Result Date: 01/09/2020 CLINICAL DATA:  Abnormal liver function tests EXAM: ULTRASOUND ABDOMEN LIMITED RIGHT UPPER QUADRANT COMPARISON:  None. FINDINGS: Gallbladder: Absent Common bile duct: Diameter: 5 mm in proximal diameter Liver: The hepatic parenchyma demonstrates diffusely increased parenchymal echogenicity and slight coarsening of the parenchymal echotexture in keeping with changes of at least moderate hepatic steatosis. No focal intrahepatic masses are seen. There is no intrahepatic biliary ductal dilation. Liver size is  within normal limits. Portal vein is patent on color Doppler imaging with normal direction of blood flow towards the liver. Other: No ascites IMPRESSION: Moderate hepatic steatosis. Status post cholecystectomy Electronically Signed   By: Fidela Salisbury MD   On: 01/09/2020 22:16        Subjective: Patient is feeling better, no nausea or vomiting, no dyspnea or chest pain.   Discharge Exam: Vitals:   01/10/20 1000 01/10/20 1113  BP:  (!) 152/69  Pulse:  63  Resp: (!) 31 18  Temp:  98.5 F (36.9 C)  SpO2:  95%   Vitals:   01/10/20 0753 01/10/20 0800 01/10/20 1000 01/10/20 1113  BP: (!) 159/95   (!) 152/69  Pulse: 63   63  Resp: 16 (!) 22 (!) 31 18  Temp: 98.1 F (36.7 C)   98.5 F (36.9 C)  TempSrc: Oral   Oral  SpO2: 97%   95%  Weight:      Height:        General: Not in pain or dyspnea  Neurology: Awake and alert, non focal  E ENT: mild pallor, no icterus, oral mucosa moist Cardiovascular: No JVD. S1-S2 present, rhythmic, no gallops, rubs, or murmurs. No lower extremity edema. Pulmonary: positive breath sounds bilaterally, with no wheezing, or rhonchi, mild rales at bases bilaterally.  Gastrointestinal. Abdomen soft and non tender Skin. No rashes Musculoskeletal: no joint deformities   The results of significant diagnostics from this hospitalization (including imaging, microbiology, ancillary and laboratory) are listed below for reference.  Microbiology: Recent Results (from the past 240 hour(s))  Respiratory Panel by RT PCR (Flu A&B, Covid) - Nasopharyngeal Swab     Status: None   Collection Time: 01/09/20  7:35 PM   Specimen: Nasopharyngeal Swab  Result Value Ref Range Status   SARS Coronavirus 2 by RT PCR NEGATIVE NEGATIVE Final    Comment: (NOTE) SARS-CoV-2 target nucleic acids are NOT DETECTED.  The SARS-CoV-2 RNA is generally detectable in upper respiratoy specimens during the acute phase of infection. The lowest concentration of SARS-CoV-2 viral  copies this assay can detect is 131 copies/mL. A negative result does not preclude SARS-Cov-2 infection and should not be used as the sole basis for treatment or other patient management decisions. A negative result may occur with  improper specimen collection/handling, submission of specimen other than nasopharyngeal swab, presence of viral mutation(s) within the areas targeted by this assay, and inadequate number of viral copies (<131 copies/mL). A negative result must be combined with clinical observations, patient history, and epidemiological information. The expected result is Negative.  Fact Sheet for Patients:  PinkCheek.be  Fact Sheet for Healthcare Providers:  GravelBags.it  This test is no t yet approved or cleared by the [place] FDA and  has been authorized for detection and/or diagnosis of SARS-CoV-2 by FDA under an Emergency Use Authorization (EUA). This EUA will remain  in effect (meaning this test can be used) for the duration of the COVID-19 declaration under Section 564(b)(1) of the Act, 21 U.S.C. section 360bbb-3(b)(1), unless the authorization is terminated or revoked sooner.     Influenza A by PCR NEGATIVE NEGATIVE Final   Influenza B by PCR NEGATIVE NEGATIVE Final    Comment: (NOTE) The Xpert Xpress SARS-CoV-2/FLU/RSV assay is intended as an aid in  the diagnosis of influenza from Nasopharyngeal swab specimens and  should not be used as a sole basis for treatment. Nasal washings and  aspirates are unacceptable for Xpert Xpress SARS-CoV-2/FLU/RSV  testing.  Fact Sheet for Patients: PinkCheek.be  Fact Sheet for Healthcare Providers: GravelBags.it  This test is not yet approved or cleared by the [place] FDA and  has been authorized for detection and/or diagnosis of SARS-CoV-2 by  FDA under an Emergency Use Authorization (EUA). This  EUA will remain  in effect (meaning this test can be used) for the duration of the  Covid-19 declaration under Section 564(b)(1) of the Act, 21  U.S.C. section 360bbb-3(b)(1), unless the authorization is  terminated or revoked. Performed at [facility], [place] [address], [place], [place] [address]      Labs: BNP (last 3 results) No results for input(s): BNP in the last 8760 hours. Basic Metabolic Panel: Recent Labs  Lab 01/09/20 1725 01/10/20 0054  NA 140 140  K 4.5 4.4  CL 109 107  CO2 21* 24  GLUCOSE 116* 89  BUN 17 19  CREATININE 0.71 0.78  CALCIUM 10.2 10.1  MG 2.1  --    Liver Function Tests: Recent Labs  Lab 01/09/20 2112 01/10/20 0054  AST 42* 35  ALT 20 21  ALKPHOS 63 59  BILITOT 1.1 1.0  PROT 7.0 7.1  ALBUMIN 4.0 3.8   No results for input(s): LIPASE, AMYLASE in the last 168 hours. No results for input(s): AMMONIA in the last 168 hours. CBC: Recent Labs  Lab 01/09/20 1725 01/10/20 0054  WBC 8.4 9.4  HGB 13.7 13.3  HCT 43.4 41.9  MCV 89.3 89.0  PLT 229 234   Cardiac Enzymes: No  results for input(s): CKTOTAL, CKMB, CKMBINDEX, TROPONINI in the last 168 hours. BNP: Invalid input(s): POCBNP CBG: No results for input(s): GLUCAP in the last 168 hours. D-Dimer No results for input(s): DDIMER in the last 72 hours. Hgb A1c No results for input(s): HGBA1C in the last 72 hours. Lipid Profile Recent Labs    01/10/20 0054  CHOL 139  HDL 56  LDLCALC 64  TRIG 95  CHOLHDL 2.5   Thyroid function studies Recent Labs    01/09/20 1725  TSH 2.201   Anemia work up No results for input(s): VITAMINB12, FOLATE, FERRITIN, TIBC, IRON, RETICCTPCT in the last 72 hours. Urinalysis    Component Value Date/Time   COLORURINE YELLOW 02/02/2012 1246   APPEARANCEUR CLEAR 02/02/2012 1246   LABSPEC 1.003 (L) 02/02/2012 1246   PHURINE 6.5 02/02/2012 1246   GLUCOSEU NEGATIVE 02/02/2012 1246   HGBUR NEGATIVE 02/02/2012 1246   BILIRUBINUR NEGATIVE  02/02/2012 1246   KETONESUR NEGATIVE 02/02/2012 1246   PROTEINUR NEGATIVE 02/02/2012 1246   UROBILINOGEN 0.2 02/02/2012 1246   NITRITE NEGATIVE 02/02/2012 1246   LEUKOCYTESUR NEGATIVE 02/02/2012 1246   Sepsis Labs Invalid input(s): PROCALCITONIN,  WBC,  LACTICIDVEN Microbiology Recent Results (from the past 240 hour(s))  Respiratory Panel by RT PCR (Flu A&B, Covid) - Nasopharyngeal Swab     Status: None   Collection Time: 01/09/20  7:35 PM   Specimen: Nasopharyngeal Swab  Result Value Ref Range Status   SARS Coronavirus 2 by RT PCR NEGATIVE NEGATIVE Final    Comment: (NOTE) SARS-CoV-2 target nucleic acids are NOT DETECTED.  The SARS-CoV-2 RNA is generally detectable in upper respiratoy specimens during the acute phase of infection. The lowest concentration of SARS-CoV-2 viral copies this assay can detect is 131 copies/mL. A negative result does not preclude SARS-Cov-2 infection and should not be used as the sole basis for treatment or other patient management decisions. A negative result may occur with  improper specimen collection/handling, submission of specimen other than nasopharyngeal swab, presence of viral mutation(s) within the areas targeted by this assay, and inadequate number of viral copies (<131 copies/mL). A negative result must be combined with clinical observations, patient history, and epidemiological information. The expected result is Negative.  Fact Sheet for Patients:  PinkCheek.be  Fact Sheet for Healthcare Providers:  GravelBags.it  This test is no t yet approved or cleared by the [place] FDA and  has been authorized for detection and/or diagnosis of SARS-CoV-2 by FDA under an Emergency Use Authorization (EUA). This EUA will remain  in effect (meaning this test can be used) for the duration of the COVID-19 declaration under Section 564(b)(1) of the Act, 21 U.S.C. section 360bbb-3(b)(1),  unless the authorization is terminated or revoked sooner.     Influenza A by PCR NEGATIVE NEGATIVE Final   Influenza B by PCR NEGATIVE NEGATIVE Final    Comment: (NOTE) The Xpert Xpress SARS-CoV-2/FLU/RSV assay is intended as an aid in  the diagnosis of influenza from Nasopharyngeal swab specimens and  should not be used as a sole basis for treatment. Nasal washings and  aspirates are unacceptable for Xpert Xpress SARS-CoV-2/FLU/RSV  testing.  Fact Sheet for Patients: PinkCheek.be  Fact Sheet for Healthcare Providers: GravelBags.it  This test is not yet approved or cleared by the [place] FDA and  has been authorized for detection and/or diagnosis of SARS-CoV-2 by  FDA under an Emergency Use Authorization (EUA). This EUA will remain  in effect (meaning this test can be used) for the duration  of the  Covid-19 declaration under Section 564(b)(1) of the Act, 21  U.S.C. section 360bbb-3(b)(1), unless the authorization is  terminated or revoked. Performed at [facility], [place] [address], [place], [place] [address]      Time coordinating discharge: 45 minutes  SIGNED:   Tawni Millers, MD  [facility] 01/10/2020, 11:14 AM

## 2020-01-10 NOTE — Evaluation (Signed)
Physical Therapy Evaluation Patient Details Name: [name] MRN: [id number] DOB: [birth year] Today's Date: 01/10/2020   History of Present Illness  74 y.o. female with medical history significant for history of CAD, CHF, hypertension, hyperlipidemia. Pt with generalized fatigue and weakness with palpitations and a "funny feeling" in her chest. EMS brought to ED Initial EKG showed atrial fibrillation with RVR. Initial EKG showed atrial fibrillation with RVR. Repeat EKG after metoprolol showed normal sinus rhythm with no acute ST elevation or depression. Admitted for observation 01/09/20.    Clinical Impression  Patient evaluated by Physical Therapy with no further acute PT needs identified. Pt would benefit from DME use but she refuses. Pt is at her baseline level of function. All education has been completed and the patient has no further questions. Pt has no follow-up Physical Therapy or equipment needs. PT is signing off. Thank you for this referral.     Follow Up Recommendations No PT follow up;Supervision for mobility/OOB    Equipment Recommendations  None recommended by PT (has DME will likely not use)    Recommendations for Other Services       Precautions / Restrictions Precautions Precautions: Fall Precaution Comments: hx of falls Restrictions Weight Bearing Restrictions: No      Mobility  Bed Mobility Overal bed mobility: Needs Assistance Bed Mobility: Supine to Sit;Sit to Supine     Supine to sit: Min guard;HOB elevated Sit to supine: Min guard;HOB elevated   General bed mobility comments: min guard for safety, use of bed rail to pull to EoB  Transfers Overall transfer level: Needs assistance Equipment used: None Transfers: Sit to/from Stand Sit to Stand: Min guard         General transfer comment: pt impulsively gets up at [facility], asked to sit back down to fix leads, good power up, slightly unsteady on standing   Ambulation/Gait Ambulation/Gait  assistance: Min guard Gait Distance (Feet): 80 Feet Assistive device: None Gait Pattern/deviations: Step-through pattern;Decreased step length - right;Decreased step length - left;Scissoring;Shuffle Gait velocity: slowed   General Gait Details: pt refuses sock use for ambulation in hallway, pt drifts back and forth, with ocassional scissoring, therapist suggests AD use but pt refuses, instability but no LoB         Balance Overall balance assessment: Needs assistance Sitting-balance support: No upper extremity supported;Feet supported Sitting balance-Leahy Scale: Good     Standing balance support: No upper extremity supported;During functional activity Standing balance-Leahy Scale: Fair                               Pertinent Vitals/Pain Pain Assessment: No/denies pain    Home Living Family/patient expects to be discharged to:: Private residence Living Arrangements: Alone Available Help at Discharge: Friend(s);Available PRN/intermittently (daughter lives down the road from her) Type of Home: House Home Access: Level entry     Home Layout: One level Home Equipment: Cane - single point;Walker - 2 wheels;Walker - 4 wheels;Grab bars - tub/shower;Shower seat Additional Comments: reports she is transitioning to ALF when unit becomes available     Prior Function Level of Independence: Independent         Comments: daughter drives for shopping and appointments     Hand Dominance   Dominant Hand: Right    Extremity/Trunk Assessment   Upper Extremity Assessment Upper Extremity Assessment: Defer to OT evaluation    Lower Extremity Assessment Lower Extremity Assessment: Overall WFL for tasks assessed  Cervical / Trunk Assessment Cervical / Trunk Assessment: Kyphotic  Communication   Communication: No difficulties  Cognition Arousal/Alertness: Awake/alert Behavior During Therapy: WFL for tasks assessed/performed;Flat affect Overall Cognitive Status:  No family/caregiver present to determine baseline cognitive functioning                                 General Comments: impulsive and difficulties with command follow due to talking over therapist likely baseline       General Comments General comments (skin integrity, edema, etc.): at rest BP 129/68, HR 78-86      Assessment/Plan    PT Assessment Patent does not need any further PT services         PT Goals (Current goals can be found in the Care Plan section)  Acute Rehab PT Goals Patient Stated Goal: go home PT Goal Formulation: With patient     AM-PAC PT "6 Clicks" Mobility  Outcome Measure Help needed turning from your back to your side while in a flat bed without using bedrails?: None Help needed moving from lying on your back to sitting on the side of a flat bed without using bedrails?: A Little Help needed moving to and from a bed to a chair (including a wheelchair)?: None Help needed standing up from a chair using your arms (e.g., wheelchair or bedside chair)?: None Help needed to walk in hospital room?: None Help needed climbing 3-5 steps with a railing? : A Little 6 Click Score: 22    End of Session   Activity Tolerance: Patient tolerated treatment well Patient left: in bed;with call bell/phone within reach;with bed alarm set Nurse Communication: Mobility status PT Visit Diagnosis: Difficulty in walking, not elsewhere classified (R26.2)    Time: 7673-4193 PT Time Calculation (min) (ACUTE ONLY): 13 min   Charges:   PT Evaluation $PT Eval Moderate Complexity: 1 Mod          Tavari Loadholt B. Migdalia Dk PT, DPT Acute Rehabilitation Services Pager [phone] Office [phone]   [place] 01/10/2020, 10:24 AM

## 2020-01-10 NOTE — Evaluation (Signed)
Occupational Therapy Evaluation Patient Details Name: [name] MRN: [id number] DOB: 1945-11-08 Today's Date: 01/10/2020    History of Present Illness 74 y.o. female with medical history significant for history of CAD, CHF, hypertension, hyperlipidemia. Pt with generalized fatigue and weakness with palpitations and a "funny feeling" in her chest. EMS brought to ED Initial EKG showed atrial fibrillation with RVR. Initial EKG showed atrial fibrillation with RVR. Repeat EKG after metoprolol showed normal sinus rhythm with no acute ST elevation or depression. Admitted for observation 01/09/20.   Clinical Impression   PTA pt living alone with as needed assist for IADL from daughter. At time of eval, pt able to complete transfers and ADL at min guard- supervision level fors afety. Pt presents mildly impulsive with poor attention, memory, and awareness of deficits (unsure of baseline). Pt was able to demonstrate toilet transfer and functional mobility a household distance without external assist. Pt plans to move to ALF once unit is available, OT agrees with this decision for safety. Will continue to follow per POC listed below.    Follow Up Recommendations  No OT follow up;Other (comment);Supervision - Intermittent (Pt plans to move into ALF soon)    Equipment Recommendations  None recommended by OT    Recommendations for Other Services       Precautions / Restrictions Precautions Precautions: Fall Precaution Comments: hx of falls Restrictions Weight Bearing Restrictions: No      Mobility Bed Mobility Overal bed mobility: Needs Assistance Bed Mobility: Supine to Sit;Sit to Supine     Supine to sit: Min guard Sit to supine: Min guard   General bed mobility comments: min guard for safety, use of bed rail to pull to EoB  Transfers Overall transfer level: Needs assistance Equipment used: None Transfers: Sit to/from Stand Sit to Stand: Min guard         General  transfer comment: pt impulsively gets up at [facility], asked to sit back down to fix leads, good power up, slightly unsteady on standing     Balance Overall balance assessment: Needs assistance Sitting-balance support: No upper extremity supported;Feet supported Sitting balance-Leahy Scale: Good     Standing balance support: No upper extremity supported;During functional activity Standing balance-Leahy Scale: Fair Standing balance comment: min guard throughout functional activity to maintain safety.                           ADL either performed or assessed with clinical judgement   ADL                                         General ADL Comments: Pt able to complete ADLs at supervision level for safety. Increased cues needed for safety as well- pt is slightly impulsive and has history of falls. Refused to wear socks this session     Vision Baseline Vision/History: Wears glasses Wears Glasses: At all times Patient Visual Report: No change from baseline       Perception     Praxis      Pertinent Vitals/Pain Pain Assessment: No/denies pain     Hand Dominance Right   Extremity/Trunk Assessment Upper Extremity Assessment Upper Extremity Assessment: Overall WFL for tasks assessed   Lower Extremity Assessment Lower Extremity Assessment: Overall WFL for tasks assessed   Cervical / Trunk Assessment Cervical / Trunk Assessment: Kyphotic   Communication Communication  Communication: No difficulties   Cognition Arousal/Alertness: Awake/alert Behavior During Therapy: WFL for tasks assessed/performed Overall Cognitive Status: No family/caregiver present to determine baseline cognitive functioning Area of Impairment: Attention;Memory;Following commands;Safety/judgement;Problem solving                   Current Attention Level: Sustained Memory: Decreased short-term memory   Safety/Judgement: Decreased awareness of safety;Decreased awareness of  deficits   Problem Solving: Requires verbal cues General Comments: pt slightly impulsive, very tangential. requires cues for safety   General Comments  at rest BP 129/68, HR 78-86     Exercises     Shoulder Instructions      Home Living Family/patient expects to be discharged to:: Private residence Living Arrangements: Alone Available Help at Discharge: Family;Available PRN/intermittently (daughter lives close by) Type of Home: House Home Access: Level entry     Home Layout: One level     Bathroom Shower/Tub: Occupational psychologist: Handicapped height Bathroom Accessibility: Yes   Home Equipment: Cane - single point;Walker - 2 wheels;Walker - 4 wheels;Grab bars - tub/shower;Shower seat   Additional Comments: reports she is transitioning to ALF when unit becomes available       Prior Functioning/Environment Level of Independence: Independent        Comments: daughter drives for shopping and appointments. Does have history of falls        OT Problem List: Decreased knowledge of use of DME or AE;Decreased activity tolerance;Cardiopulmonary status limiting activity      OT Treatment/Interventions:      OT Goals(Current goals can be found in the care plan section) Acute Rehab OT Goals Patient Stated Goal: go home OT Goal Formulation: All assessment and education complete, DC therapy  OT Frequency:     Barriers to D/C:            Co-evaluation              AM-PAC OT "6 Clicks" Daily Activity     Outcome Measure Help from another person eating meals?: None Help from another person taking care of personal grooming?: None Help from another person toileting, which includes using toliet, bedpan, or urinal?: None Help from another person bathing (including washing, rinsing, drying)?: A Little Help from another person to put on and taking off regular upper body clothing?: None Help from another person to put on and taking off regular lower body  clothing?: None 6 Click Score: 23   End of Session Nurse Communication: Mobility status  Activity Tolerance: Patient tolerated treatment well Patient left: in bed;with call bell/phone within reach;with bed alarm set  OT Visit Diagnosis: Other abnormalities of gait and mobility (R26.89);Other symptoms and signs involving cognitive function                Time: 2119-4174 OT Time Calculation (min): 29 min Charges:  OT General Charges $OT Visit: 1 Visit OT Evaluation $OT Eval Moderate Complexity: [place], MSOT, OTR/L [facility] Office Number: [phone] Pager: [phone]  Zenovia Jarred 01/10/2020, 12:21 PM

## 2020-01-19 ENCOUNTER — Encounter: Payer: Self-pay | Admitting: Physician Assistant

## 2020-01-19 NOTE — Progress Notes (Addendum)
Cardiology Office Note    Date:  01/22/2020   ID:  [name], [name] [birth year], MRN [id number]  PCP:  Kristi Arabian, MD  Cardiologist:  Kristi Him, MD  Electrophysiologist:  None   Chief Complaint: f/u afib  History of Present Illness:   [name] is a 74 y.o. female with history of CAD (status post PCI of the LAD/RCA 2007), hypertension, hyperlipidemia, GERD, restless leg syndrome, arthritis, OSA (refuses CPAP) and remote migraines with recently diagnosed atrial fibrillation who presents for post hospital follow-up.  At time of her PCI in 2007, cath also showed 30% mid LAD, 20% OM1, 70% distal left circumflex too small for PCI, 90% acute RV marginal too small for PCI.  Stress test in 2014 was low risk. She recently presented to the hospital with episode of chest discomfort/palpitations with several similar episodes recently.  She was found to be in rapid atrial fibrillation with diffuse ST depressions.  2D echocardiogram 01/10/2020 showed EF 60-65% with moderate LVH, grade 1 diastolic dysfunction, otherwise no acute findings.  She spontaneously converted to sinus rhythm in the emergency room.  She was continued on her home dose of metoprolol.  It was felt that if she had recurrent atrial fibrillation she would likely require antiarrhythmics given her baseline heart rate in the 50s to 60s.  She was started on Eliquis.  Of note there was a spurious INR value during her admission felt to be lab error.  Her Plavix was discontinued in the context of needing to start anticoagulation.  The last cardiology note suggested to continue aspirin but it looks like this was also discontinued on her AVS.  She presents back for follow-up feeling well.  She denies any interim chest discomfort, shortness of breath, palpitations, orthopnea, syncope or bleeding.  She has not had any recent angina.  In the hospital sleep study was suggested although she clarifies that she had one about 2 years ago  and has no interest in revisiting treatment for this.  She could not tolerate the CPAP and does not wish to revisit.  She walks with a cane.  She is generally not very active, but does try to get out and walk to the mailbox or mill around in the yard. Her family generally requests that she only do so in the presence of other family since she apparently had a fall in April 2020 with an ankle fracture. She has not sustained any further falls.   Labwork independently reviewed: 12/2019 LDL 64, CMET wnl, CBC wnl, TSH wnl, Mg 2.1   Past Medical History:  Diagnosis Date  . Arthritis   . Coronary artery disease    s/p PCI of LAD/RCA, cath 2007 with 30% mid LAD, 20% OM1, 70% distal left circ too small for PCI, 90% acute RV mariginal too small for PCI  . Depression   . Diastolic dysfunction   . GERD (gastroesophageal reflux disease)   . Headache    hx migraines 32 yrs ago  . Hyperlipemia   . Hypertension   . PAF (paroxysmal atrial fibrillation) ([place])   . RLS (restless legs syndrome)     Past Surgical History:  Procedure Laterality Date  . both wrist surgery  1985  . CARDIAC CATHETERIZATION    . CHOLECYSTECTOMY    . COLONOSCOPY WITH PROPOFOL N/A 08/23/2016   Procedure: COLONOSCOPY WITH PROPOFOL;  Surgeon: Kristi Fair, MD;  Location: [facility];  Service: Endoscopy;  Laterality: N/A;  . CORONARY STENT PLACEMENT  2006  x2  . LUMBAR DISC SURGERY    . ROTATOR CUFF REPAIR Left    shoulder  . TENDON REPAIR Right    ankle  . TOTAL KNEE ARTHROPLASTY Left 12/21/2012   Procedure: LEFT TOTAL KNEE ARTHROPLASTY;  Surgeon: Kristi Schooling, MD;  Location: [place];  Service: Orthopedics;  Laterality: Left;    Current Medications: Current Meds  Medication Sig  . apixaban (ELIQUIS) 5 MG TABS tablet Take 1 tablet (5 mg total) by mouth 2 (two) times daily.  . Calcium Carbonate-Vitamin D (CALCIUM 600+D) 600-400 MG-UNIT per tablet Take 1 tablet by mouth daily.  . Carboxymethylcellul-Glycerin (CLEAR  EYES FOR DRY EYES OP) Place 1 drop into both eyes 2 (two) times daily.  . Cholecalciferol (VITAMIN D) 2000 units CAPS Take 2,000 Units by mouth daily.  Marland Kitchen ezetimibe (ZETIA) 10 MG tablet Take 10 mg by mouth every evening.   . isosorbide mononitrate (IMDUR) 60 MG 24 hr tablet Take 60 mg by mouth daily.  . magnesium oxide (MAG-OX) 400 MG tablet Take 400 mg by mouth daily.  . Melatonin 10 MG TABS Take 1 tablet by mouth daily as needed (for sleep).  . metoprolol succinate (TOPROL-XL) 25 MG 24 hr tablet Take 25 mg by mouth daily.  . Omega-3 Fatty Acids (FISH OIL PO) Take 1 capsule by mouth daily.   . pantoprazole (PROTONIX) 40 MG tablet Take 40 mg by mouth daily.  . pravastatin (PRAVACHOL) 80 MG tablet Take 80 mg by mouth every evening.  Marland Kitchen rOPINIRole (REQUIP) 0.5 MG tablet Take 0.5-1 mg by mouth 2 (two) times daily. For restless leg syndrome 1 IN THE MORNING AND 2 AT NIGHT  . sertraline (ZOLOFT) 50 MG tablet Take 50 mg by mouth at bedtime.       Allergies:   Lipitor [atorvastatin], Other, Prednisone, and Limbitrol ds  [chlordiazepoxide-amitriptyline]   Social History   Socioeconomic History  . Marital status: Legally Separated    Spouse name: [name]  . Number of children: 1  . Years of education: GED  . Highest education level: Not on file  Occupational History  . Occupation: Retired    Fish farm manager: UNEMPLOYED  Tobacco Use  . Smoking status: Never Smoker  . Smokeless tobacco: Never Used  Vaping Use  . Vaping Use: Never used  Substance and Sexual Activity  . Alcohol use: No  . Drug use: No  . Sexual activity: Not on file  Other Topics Concern  . Not on file  Social History Narrative   Pt lives at home alone.   Caffeine: 2-3 cups of coffee daily   Her daughter manages her medications   Social Determinants of Health   Financial Resource Strain:   . Difficulty of Paying Living Expenses: Not on file  Food Insecurity:   . Worried About Charity fundraiser in the Last Year: Not on  file  . Ran Out of Food in the Last Year: Not on file  Transportation Needs:   . Lack of Transportation (Medical): Not on file  . Lack of Transportation (Non-Medical): Not on file  Physical Activity:   . Days of Exercise per Week: Not on file  . Minutes of Exercise per Session: Not on file  Stress:   . Feeling of Stress : Not on file  Social Connections:   . Frequency of Communication with Friends and Family: Not on file  . Frequency of Social Gatherings with Friends and Family: Not on file  . Attends Religious Services: Not on file  .  Active Member of Clubs or Organizations: Not on file  . Attends Archivist Meetings: Not on file  . Marital Status: Not on file     Family History:  The patient's family history includes ALS in her sister; Dementia in her mother and sister; Heart Problems in her father and mother; Lymphoma in her sister and sister; Parkinson's disease in her sister.  ROS:   Please see the history of present illness.  All other systems are reviewed and otherwise negative.    EKGs/Labs/Other Studies Reviewed:    Studies reviewed are outlined and summarized above. Reports included below if pertinent.  2D Echo 01/10/20   1. Left ventricular ejection fraction, by estimation, is 60 to 65%. The  left ventricle has normal function. The left ventricle has no regional  wall motion abnormalities. There is moderate left ventricular hypertrophy.  Left ventricular diastolic  parameters are consistent with Grade I diastolic dysfunction (impaired  relaxation).  2. Right ventricular systolic function is normal. The right ventricular  size is normal. Tricuspid regurgitation signal is inadequate for assessing  PA pressure.  3. The mitral valve is normal in structure. Trivial mitral valve  regurgitation. No evidence of mitral stenosis.  4. The aortic valve is normal in structure. Aortic valve regurgitation is  not visualized. No aortic stenosis is present.  5.  The inferior vena cava is normal in size with greater than 50%  respiratory variability, suggesting right atrial pressure of 3 mmHg.     EKG:  EKG is not ordered today  Recent Labs: 01/09/2020: Magnesium 2.1; TSH 2.201 01/10/2020: ALT 21; BUN 19; Creatinine, Ser 0.78; Hemoglobin 13.3; Platelets 234; Potassium 4.4; Sodium 140  Recent Lipid Panel    Component Value Date/Time   CHOL 139 01/10/2020 0054   CHOL 159 12/13/2016 1233   TRIG 95 01/10/2020 0054   HDL 56 01/10/2020 0054   HDL 64 12/13/2016 1233   CHOLHDL 2.5 01/10/2020 0054   VLDL 19 01/10/2020 0054   LDLCALC 64 01/10/2020 0054   LDLCALC 65 12/13/2016 1233    PHYSICAL EXAM:    VS:  BP 140/82   Pulse 64   Ht 5\' 4"  (1.626 m)   Wt 215 lb 9.6 oz (97.8 kg)   BMI 37.01 kg/m   BMI: Body mass index is 37.01 kg/m.  GEN: Well nourished, well developed WF in no acute distress HEENT: normocephalic, atraumatic Neck: no JVD, carotid bruits, or masses Cardiac: RRR; no murmurs, rubs, or gallops, no edema  Respiratory:  clear to auscultation bilaterally, normal work of breathing GI: soft, nontender, nondistended, + BS MS: no deformity or atrophy Skin: warm and dry, no rash Neuro:  Alert and Oriented x 3, Strength and sensation are intact, follows commands Psych: euthymic mood, full affect  Wt Readings from Last 3 Encounters:  01/22/20 215 lb 9.6 oz (97.8 kg)  01/10/20 215 lb 2.7 oz (97.6 kg)  10/17/18 213 lb (96.6 kg)     ASSESSMENT & PLAN:   1. Paroxysmal atrial fibrillation - recently spontaneously converted to sinus rhythm in the emergency department.  No clear trigger at that time.  Fortunately she has not had any recurrent symptoms.  She is in NSR on exam today. We will continue Eliquis.  Bleeding precautions reviewed.  Will update her BMET/CBC today to ensure no acute changes.  Continue beta-blocker for now.  We did discuss the relationship between sleep apnea and atrial fibrillation but she has no interest in pursuing  further therapies for sleep  apnea at this time.  She will let us know if she changes her mind. 2. CAD - remote stenting noted. She clarifies that with her recent episode she did not really have chest pain so much as pounding palpitations with her atrial fibrillation.  She has been able to get around the house without any exertional angina or dyspnea.She was previously on dual antiplatelet therapy.  The last cardiology notes suggest to continue aspirin but discontinue Plavix.  Both were discontinued in the hospital per AVS. She does have a prior history of a fall and has not had any recent anginal events. I will reach out to Dr. Radford Pax for her input on aspirin.  Continue beta-blocker and statin therapy. 3. Essential HTN - blood pressure is mildly elevated today.  We discussed parameters for monitoring at home.  She will relay readings in 1 week.  Would consider switch from metoprolol to carvedilol if needed.  4. Hyperlipidemia - most recent labs showed LDL at goal.  Continue current therapy.  Disposition: F/u with Dr. Radford Pax in 3-4 months.   Medication Adjustments/Labs and Tests Ordered: Current medicines are reviewed at length with the patient today.  Concerns regarding medicines are outlined above. Medication changes, Labs and Tests ordered today are summarized above and listed in the Patient Instructions accessible in Encounters.   Signed, Charlie Pitter, PA-C  01/22/2020 4:03 PM    [facility] [address], [place], [place]  [address] Phone: [phone]; Fax: [phone]

## 2020-01-22 ENCOUNTER — Encounter: Payer: Self-pay | Admitting: Physician Assistant

## 2020-01-22 ENCOUNTER — Other Ambulatory Visit: Payer: Self-pay

## 2020-01-22 ENCOUNTER — Ambulatory Visit (INDEPENDENT_AMBULATORY_CARE_PROVIDER_SITE_OTHER): Payer: Medicare Other | Admitting: Physician Assistant

## 2020-01-22 VITALS — BP 140/82 | HR 64 | Ht 64.0 in | Wt 215.6 lb

## 2020-01-22 DIAGNOSIS — I480 Paroxysmal atrial fibrillation: Secondary | ICD-10-CM

## 2020-01-22 DIAGNOSIS — I10 Essential (primary) hypertension: Secondary | ICD-10-CM

## 2020-01-22 DIAGNOSIS — E785 Hyperlipidemia, unspecified: Secondary | ICD-10-CM | POA: Diagnosis not present

## 2020-01-22 DIAGNOSIS — I2510 Atherosclerotic heart disease of native coronary artery without angina pectoris: Secondary | ICD-10-CM

## 2020-01-22 NOTE — Patient Instructions (Addendum)
Medication Instructions:  Your physician recommends that you continue on your current medications as directed. Please refer to the Current Medication list given to you today.  *If you need a refill on your cardiac medications before your next appointment, please call your pharmacy*   Lab Work: TODAY:  BMET & CBC  If you have labs (blood work) drawn today and your tests are completely normal, you will receive your results only by: Marland Kitchen MyChart Message (if you have MyChart) OR . A paper copy in the mail If you have any lab test that is abnormal or we need to change your treatment, we will call you to review the results.   Testing/Procedures: None ordered   Follow-Up: At [facility], you and your health needs are our priority.  As part of our continuing mission to provide you with exceptional heart care, we have created designated Provider Care Teams.  These Care Teams include your primary Cardiologist (physician) and Advanced Practice Providers (APPs -  Physician Assistants and Nurse Practitioners) who all work together to provide you with the care you need, when you need it.  We recommend signing up for the patient portal called "MyChart".  Sign up information is provided on this After Visit Summary.  MyChart is used to connect with patients for Virtual Visits (Telemedicine).  Patients are able to view lab/test results, encounter notes, upcoming appointments, etc.  Non-urgent messages can be sent to your provider as well.   To learn more about what you can do with MyChart, go to [email or web address].    Your next appointment:   3 month(s)  The format for your next appointment:   In Person  Provider:   Fransico Him, MD   Other Instructions If you notice any bleeding such as blood in stool, black tarry stools, blood in urine, nosebleeds or any other unusual bleeding, call your doctor immediately. It is not normal to have this kind of bleeding while on a blood thinner and usually  indicates there is an underlying problem with one of your body systems that needs to be checked out.   I would recommend using a blood pressure cuff that goes on your arm. The wrist ones can be inaccurate. If possible, try to select one that also reports your heart rate. To check your blood pressure, choose a time at least 3 hours after taking your blood pressure medicines. If you can sample it at different times of the day, that's great - it might give you more information about how your blood pressure fluctuates. Remain seated in a chair for 5 minutes quietly beforehand, then check it. Please record a list of those readings and call us/send in MyChart message with them for our review in 1 week.

## 2020-01-23 LAB — CBC
Hematocrit: 38.8 % (ref 34.0–46.6)
Hemoglobin: 13.1 g/dL (ref 11.1–15.9)
MCH: 29.3 pg (ref 26.6–33.0)
MCHC: 33.8 g/dL (ref 31.5–35.7)
MCV: 87 fL (ref 79–97)
Platelets: 203 10*3/uL (ref 150–450)
RBC: 4.47 x10E6/uL (ref 3.77–5.28)
RDW: 12.6 % (ref 11.7–15.4)
WBC: 7.8 10*3/uL (ref 3.4–10.8)

## 2020-01-23 LAB — BASIC METABOLIC PANEL
BUN/Creatinine Ratio: 21 (ref 12–28)
BUN: 14 mg/dL (ref 8–27)
CO2: 23 mmol/L (ref 20–29)
Calcium: 10.3 mg/dL (ref 8.7–10.3)
Chloride: 105 mmol/L (ref 96–106)
Creatinine, Ser: 0.66 mg/dL (ref 0.57–1.00)
GFR calc Af Amer: 101 mL/min/{1.73_m2} (ref >59)
GFR calc non Af Amer: 87 mL/min/{1.73_m2} (ref >59)
Glucose: 83 mg/dL (ref 65–99)
Potassium: 4.5 mmol/L (ref 3.5–5.2)
Sodium: 142 mmol/L (ref 134–144)

## 2020-02-02 ENCOUNTER — Emergency Department (HOSPITAL_COMMUNITY): Payer: Medicare Other

## 2020-02-02 ENCOUNTER — Emergency Department (HOSPITAL_COMMUNITY)
Admission: EM | Admit: 2020-02-02 | Discharge: 2020-02-03 | Disposition: A | Discharge status: Home / Self Care | Payer: Medicare Other | Attending: Emergency Medicine | Admitting: Emergency Medicine

## 2020-02-02 ENCOUNTER — Encounter (HOSPITAL_COMMUNITY): Payer: Self-pay | Admitting: Emergency Medicine

## 2020-02-02 ENCOUNTER — Other Ambulatory Visit: Payer: Self-pay

## 2020-02-02 DIAGNOSIS — Z79899 Other long term (current) drug therapy: Secondary | ICD-10-CM | POA: Insufficient documentation

## 2020-02-02 DIAGNOSIS — R002 Palpitations: Secondary | ICD-10-CM | POA: Diagnosis present

## 2020-02-02 DIAGNOSIS — I2510 Atherosclerotic heart disease of native coronary artery without angina pectoris: Secondary | ICD-10-CM | POA: Diagnosis not present

## 2020-02-02 DIAGNOSIS — I110 Hypertensive heart disease with heart failure: Secondary | ICD-10-CM | POA: Insufficient documentation

## 2020-02-02 DIAGNOSIS — Z96652 Presence of left artificial knee joint: Secondary | ICD-10-CM | POA: Diagnosis not present

## 2020-02-02 DIAGNOSIS — I480 Paroxysmal atrial fibrillation: Secondary | ICD-10-CM | POA: Diagnosis not present

## 2020-02-02 DIAGNOSIS — I5033 Acute on chronic diastolic (congestive) heart failure: Secondary | ICD-10-CM | POA: Diagnosis not present

## 2020-02-02 DIAGNOSIS — Z955 Presence of coronary angioplasty implant and graft: Secondary | ICD-10-CM | POA: Diagnosis not present

## 2020-02-02 LAB — PROTIME-INR
INR: 1.1 (ref 0.8–1.2)
Prothrombin Time: 13.8 seconds (ref 11.4–15.2)

## 2020-02-02 LAB — BASIC METABOLIC PANEL
Anion gap: 10 (ref 5–15)
BUN: 16 mg/dL (ref 8–23)
CO2: 22 mmol/L (ref 22–32)
Calcium: 10.1 mg/dL (ref 8.9–10.3)
Chloride: 107 mmol/L (ref 98–111)
Creatinine, Ser: 0.84 mg/dL (ref 0.44–1.00)
GFR, Estimated: >60 mL/min (ref >60)
Glucose, Bld: 158 mg/dL — ABNORMAL HIGH (ref 70–99)
Potassium: 4.2 mmol/L (ref 3.5–5.1)
Sodium: 139 mmol/L (ref 135–145)

## 2020-02-02 LAB — CBC
HCT: 44.8 % (ref 36.0–46.0)
Hemoglobin: 14 g/dL (ref 12.0–15.0)
MCH: 28 pg (ref 26.0–34.0)
MCHC: 31.3 g/dL (ref 30.0–36.0)
MCV: 89.6 fL (ref 80.0–100.0)
Platelets: 215 10*3/uL (ref 150–400)
RBC: 5 MIL/uL (ref 3.87–5.11)
RDW: 13.1 % (ref 11.5–15.5)
WBC: 9.6 10*3/uL (ref 4.0–10.5)
nRBC: 0 % (ref 0.0–0.2)

## 2020-02-02 MED ORDER — ETOMIDATE 2 MG/ML IV SOLN
10.0000 mg | Freq: Once | INTRAVENOUS | Status: DC
  Filled 2020-02-02: qty 10

## 2020-02-02 NOTE — ED Notes (Signed)
Patient transported to X-ray 

## 2020-02-02 NOTE — ED Notes (Signed)
Please call pt's daughter at [phone] with an update.

## 2020-02-02 NOTE — ED Provider Notes (Signed)
[facility] EMERGENCY DEPARTMENT Provider Note   CSN: [id number] Arrival date & time: 02/02/20  2241     History Chief Complaint  Patient presents with  . Palpitations    [name] is a 74 y.o. female.  74 year old female with multiple medical problems document below who presents to the emergency department today with palpitations.  Patient states that she was recently in the hospital for atrial fibrillation.  Has been compliant with her anticoagulation.  Medicines and other medication changes for which she is also been compliant.  States around 730 started feeling palpitations.  She called her daughter over who apparently used a pulse ox and noticed that her heart rate is fast and she came here via EMS for further evaluation.  Currently EMS gave 10 of Cardizem which did not help.        Past Medical History:  Diagnosis Date  . Arthritis   . Coronary artery disease    s/p PCI of LAD/RCA, cath 2007 with 30% mid LAD, 20% OM1, 70% distal left circ too small for PCI, 90% acute RV mariginal too small for PCI  . Depression   . Diastolic dysfunction   . GERD (gastroesophageal reflux disease)   . Headache    hx migraines 32 yrs ago  . Hyperlipemia   . Hypertension   . OSA (obstructive sleep apnea)   . PAF (paroxysmal atrial fibrillation) ([place])   . RLS (restless legs syndrome)     Patient Active Problem List   Diagnosis Date Noted  . Acute on chronic diastolic CHF (congestive heart failure) ([place]) 01/10/2020  . New onset atrial fibrillation ([place]) 01/09/2020  . Coagulopathy ([place]) 01/09/2020  . Atrial fibrillation, new onset ([place]) 01/09/2020  . Dementia with behavioral disturbance ([place]) 10/21/2018  . Insomnia 02/13/2013  . Coronary artery disease   . Hypertension   . Hyperlipemia   . Diastolic dysfunction   . RLS (restless legs syndrome)   . Memory loss 08/29/2012  . TIA (transient ischemic attack) 07/31/2012  . Muscle spasm 07/31/2012    Past  Surgical History:  Procedure Laterality Date  . both wrist surgery  1985  . CARDIAC CATHETERIZATION    . CHOLECYSTECTOMY    . COLONOSCOPY WITH PROPOFOL N/A 08/23/2016   Procedure: COLONOSCOPY WITH PROPOFOL;  Surgeon: Garlan Fair, MD;  Location: [facility];  Service: Endoscopy;  Laterality: N/A;  . CORONARY STENT PLACEMENT  2006   x2  . LUMBAR DISC SURGERY    . ROTATOR CUFF REPAIR Left    shoulder  . TENDON REPAIR Right    ankle  . TOTAL KNEE ARTHROPLASTY Left 12/21/2012   Procedure: LEFT TOTAL KNEE ARTHROPLASTY;  Surgeon: Augustin Schooling, MD;  Location: [facility];  Service: Orthopedics;  Laterality: Left;     OB History   No obstetric history on file.     Family History  Problem Relation Age of Onset  . Heart Problems Mother   . Dementia Mother   . Heart Problems Father   . Lymphoma Sister   . ALS Sister   . Parkinson's disease Sister   . Dementia Sister   . Lymphoma Sister     Social History   Tobacco Use  . Smoking status: Never Smoker  . Smokeless tobacco: Never Used  Vaping Use  . Vaping Use: Never used  Substance Use Topics  . Alcohol use: No  . Drug use: No    Home Medications Prior to Admission medications   Medication  Sig Start Date End Date Taking? Authorizing Provider  apixaban (ELIQUIS) 5 MG TABS tablet Take 1 tablet (5 mg total) by mouth 2 (two) times daily. 01/10/20 02/09/20  Arrien, Jimmy Picket, MD  Calcium Carbonate-Vitamin D (CALCIUM 600+D) 600-400 MG-UNIT per tablet Take 1 tablet by mouth daily.    [provider]  Carboxymethylcellul-Glycerin (CLEAR EYES FOR DRY EYES OP) Place 1 drop into both eyes 2 (two) times daily.    [provider]  Cholecalciferol (VITAMIN D) 2000 units CAPS Take 2,000 Units by mouth daily.    [provider]  ezetimibe (ZETIA) 10 MG tablet Take 10 mg by mouth every evening.     [provider]  isosorbide mononitrate (IMDUR) 60 MG 24 hr tablet Take 60 mg by mouth daily.     [provider]  magnesium oxide (MAG-OX) 400 MG tablet Take 400 mg by mouth daily.    [provider]  Melatonin 10 MG TABS Take 1 tablet by mouth daily as needed (for sleep).    [provider]  metoprolol succinate (TOPROL-XL) 25 MG 24 hr tablet Take 25 mg by mouth daily.    [provider]  Omega-3 Fatty Acids (FISH OIL PO) Take 1 capsule by mouth daily.     [provider]  pantoprazole (PROTONIX) 40 MG tablet Take 40 mg by mouth daily.    [provider]  pravastatin (PRAVACHOL) 80 MG tablet Take 80 mg by mouth every evening.    [provider]  rOPINIRole (REQUIP) 0.5 MG tablet Take 0.5-1 mg by mouth 2 (two) times daily. For restless leg syndrome 1 IN THE MORNING AND 2 AT NIGHT    [provider]  sertraline (ZOLOFT) 50 MG tablet Take 50 mg by mouth at bedtime.     [provider]    Allergies    Lipitor [atorvastatin], Other, Prednisone, and Limbitrol ds  [chlordiazepoxide-amitriptyline]  Review of Systems   Review of Systems  All other systems reviewed and are negative.   Physical Exam Updated Vital Signs BP 124/86   Pulse (!) 55   Temp 98.2 F (36.8 C) (Oral)   Resp (!) 23   Ht 5\' 4"  (1.626 m)   Wt 97.8 kg   SpO2 98%   BMI 37.01 kg/m   Physical Exam Vitals and nursing note reviewed.  Constitutional:      Appearance: She is well-developed.  HENT:     Head: Normocephalic and atraumatic.     Mouth/Throat:     Mouth: Mucous membranes are moist.     Pharynx: Oropharynx is clear.  Eyes:     Pupils: Pupils are equal, round, and reactive to light.  Cardiovascular:     Rate and Rhythm: Tachycardia present. Rhythm irregular.  Pulmonary:     Effort: No respiratory distress.     Breath sounds: No stridor.  Abdominal:     General: Abdomen is flat. There is no distension.  Musculoskeletal:        General: No swelling or tenderness. Normal range of motion.     Cervical back: Normal range  of motion.  Skin:    General: Skin is warm and dry.  Neurological:     General: No focal deficit present.     Mental Status: She is alert.     ED Results / Procedures / Treatments   Labs (all labs ordered are listed, but only abnormal results are displayed) Labs Reviewed  BASIC METABOLIC PANEL - Abnormal; Notable for the  following components:      Result Value   Glucose, Bld 158 (*)    All other components within normal limits  TROPONIN I (HIGH SENSITIVITY) - Abnormal; Notable for the following components:   Troponin I (High Sensitivity) 24 (*)    All other components within normal limits  CBC  PROTIME-INR  TROPONIN I (HIGH SENSITIVITY)    EKG EKG Interpretation  Date/Time:  Sunday February 02 2020 23:25:38 EST Ventricular Rate:  152 PR Interval:    QRS Duration: 108 QT Interval:  298 QTC Calculation: 474 R Axis:   23 Text Interpretation: Atrial fibrillation with rapid V-rate Ventricular premature complex Abnormal R-wave progression, early transition ST depression, probably rate related afib replaces sinus Confirmed by Merrily Pew [phone]) on 02/02/2020 11:36:23 PM   Radiology DG Chest 2 View  Result Date: 02/02/2020 CLINICAL DATA:  Atrial fibrillation, tachycardia EXAM: CHEST - 2 VIEW COMPARISON:  01/09/2020 FINDINGS: The heart size and mediastinal contours are within normal limits. Both lungs are clear. The visualized skeletal structures are unremarkable. IMPRESSION: No active cardiopulmonary disease. Electronically Signed   By: Randa Ngo M.D.   On: 02/02/2020 23:25    Procedures .Critical Care Performed by: Merrily Pew, MD Authorized by: Merrily Pew, MD   Critical care provider statement:    Critical care time (minutes):  45   Critical care was necessary to treat or prevent imminent or life-threatening deterioration of the following conditions:  Circulatory failure   Critical care was time spent personally by me on the following activities:  Discussions  with consultants, evaluation of patient's response to treatment, examination of patient, ordering and performing treatments and interventions, ordering and review of laboratory studies, ordering and review of radiographic studies, pulse oximetry, re-evaluation of patient's condition, obtaining history from patient or surrogate and review of old charts   (including critical care time)  Medications Ordered in ED Medications - No data to display  ED Course  I have reviewed the triage vital signs and the nursing notes.  Pertinent labs & imaging results that were available during my care of the patient were reviewed by me and considered in my medical decision making (see chart for details).    MDM Rules/Calculators/A&P                         afib rvr. Anticoagulated. Onset around 1930, will likely cardiovert.   After discussion with the daughter at the A. fib likely started a bit later in the day however patient is definitely still a candidate.  Patient set up for procedural sedation.  Patient spontaneously converted to normal sinus rhythm with some episodic bradycardia in the mid 50s.  Stable at this time.  Pending troponin.  Troponin is 24.  That is likely type II from being in A. fib RVR.  Low suspicion for ACS that she does not have chest pain or other symptoms similar to that.  After discussion with the daughter we will hold on any further intervention.  Plan for discharge this time.  Follow-up with cardiology for further management of her paroxysmal atrial fibrillation.  Final Clinical Impression(s) / ED Diagnoses Final diagnoses:  Paroxysmal atrial fibrillation [facility])    Rx / DC Orders ED Discharge Orders    None       Melville Engen, Corene Cornea, MD 02/03/20 0510

## 2020-02-02 NOTE — ED Triage Notes (Signed)
Pt brought to ED by EMS from home for complains of palpitations, pt denies any SOB or pain at this time. On EMS arrival HR 150-190 irregular, Cardizem 10 mg IV x 2 given by EMS pta, HR 110- 150 after medication, BP 15090 R 19, SPO2 98% RA.

## 2020-02-03 DIAGNOSIS — I480 Paroxysmal atrial fibrillation: Secondary | ICD-10-CM | POA: Diagnosis not present

## 2020-02-03 LAB — TROPONIN I (HIGH SENSITIVITY): Troponin I (High Sensitivity): 24 ng/L — ABNORMAL HIGH (ref <18)

## 2020-02-03 NOTE — ED Notes (Signed)
Pt now in NSR; EKG obtained and given to Dr. Dayna Barker per his request.

## 2020-02-06 NOTE — Progress Notes (Signed)
Cardiology Office Note   Date:  02/07/2020   ID:  Daysha, Ashmore 09-18-1945, MRN [id number]  PCP:  Gaynelle Arabian, MD  Cardiologist:  Dr. Radford Pax    Chief Complaint  Patient presents with  . Hospitalization Follow-up  . Atrial Fibrillation   History of Present Illness: [name] is a 74 y.o. female who presents for post hospital. She has a hx of CAD status post PCI of LAD/RCA in 2007, hypertension, hyperlipidemia   Hx with last cath also showed 30% mid LAD, 20% OM1, 70% distal left circumflex too small for PCI, 90% acute RV marginal too small for PCI.  prior echocardiogram 10/29/2014 showed EF 60 to 65%, no significant valvular disease  Admitted in Oct for atrial fib.  She reported onset of chest tightness this afternoon around 3pm.  Denied any dyspnea, lightheadedness, or syncope.  Daughter reported that patient had complained of 3-4 similar episodes recently.  On presentation to the ED, noted to be in A. fib with RVR, rates up to 160s.  Initial vital signs notable for BP 142/107, SPO2 96%, pulse 155.  Labs notable for creatinine 0.7, potassium 4.5, hemoglobin 13.7, INR 2.9, TSH 2.2.  EKG shows atrial fibrillation, rate 166, diffuse ST depressions.  Follow-up EKG shows sinus rhythm with rate 80, resolution of ST depressions.  With the new a fib convert to SR in ER , had coagulopathy with INR of 2.9 on admit but next day 1.2-- lab error.     She was placed on eliquis in Oct and  plavix stopped asa continued along with BB and imdur    02/02/20 to ER back in a fib with RVR  Dilt 10 mg did not help she again spontaneously converted to SR.  Some Loletha Grayer in the 50s Her symptoms were feeling jittery.  She and daughter called EMS - they had her take extra ASA and she was taken to hospital.  She had no chest pain.  Troponin 24   Today she is maintainig SB with HR to 49.  No lightheadedness or dizziness.    She does have sleep apnea but has trouble with her mask.  So has not been  wearing.  She will contact her MD in [place] to see if another mask would be appropriate.  But did discuss that wearing the mask may help prevent some a fib.  Also discussed antiarrhythmics though may add to brady.  discused ablation   Which they did seem interested in hearing more about.      Past Medical History:  Diagnosis Date  . Arthritis   . Coronary artery disease    s/p PCI of LAD/RCA, cath 2007 with 30% mid LAD, 20% OM1, 70% distal left circ too small for PCI, 90% acute RV mariginal too small for PCI  . Depression   . Diastolic dysfunction   . GERD (gastroesophageal reflux disease)   . Headache    hx migraines 32 yrs ago  . Hyperlipemia   . Hypertension   . OSA (obstructive sleep apnea)   . PAF (paroxysmal atrial fibrillation) ([place])   . RLS (restless legs syndrome)     Past Surgical History:  Procedure Laterality Date  . both wrist surgery  1985  . CARDIAC CATHETERIZATION    . CHOLECYSTECTOMY    . COLONOSCOPY WITH PROPOFOL N/A 08/23/2016   Procedure: COLONOSCOPY WITH PROPOFOL;  Surgeon: Garlan Fair, MD;  Location: [facility];  Service: Endoscopy;  Laterality: N/A;  . CORONARY STENT PLACEMENT  2006   x2  . LUMBAR DISC SURGERY    . ROTATOR CUFF REPAIR Left    shoulder  . TENDON REPAIR Right    ankle  . TOTAL KNEE ARTHROPLASTY Left 12/21/2012   Procedure: LEFT TOTAL KNEE ARTHROPLASTY;  Surgeon: Augustin Schooling, MD;  Location: [facility];  Service: Orthopedics;  Laterality: Left;     Current Outpatient Medications  Medication Sig Dispense Refill  . apixaban (ELIQUIS) 5 MG TABS tablet Take 1 tablet (5 mg total) by mouth 2 (two) times daily. 60 tablet 0  . Calcium Carbonate-Vitamin D (CALCIUM 600+D) 600-400 MG-UNIT per tablet Take 1 tablet by mouth daily.    . Carboxymethylcellul-Glycerin (CLEAR EYES FOR DRY EYES OP) Place 1 drop into both eyes 2 (two) times daily.    . Cholecalciferol (VITAMIN D) 2000 units CAPS Take 2,000 Units by mouth daily.    Marland Kitchen ezetimibe  (ZETIA) 10 MG tablet Take 10 mg by mouth every evening.     . isosorbide mononitrate (IMDUR) 60 MG 24 hr tablet Take 60 mg by mouth daily.    . magnesium oxide (MAG-OX) 400 MG tablet Take 400 mg by mouth daily.    . Melatonin 10 MG TABS Take 1 tablet by mouth daily as needed (for sleep).    . metoprolol succinate (TOPROL-XL) 25 MG 24 hr tablet Take 1 tablet (25 mg total) by mouth daily. May take an additional one half tablet (12.5 mg) by mouth daily if needed for A-fib 135 tablet 3  . Omega-3 Fatty Acids (FISH OIL PO) Take 1 capsule by mouth daily.     . pantoprazole (PROTONIX) 40 MG tablet Take 40 mg by mouth daily.    . pravastatin (PRAVACHOL) 80 MG tablet Take 80 mg by mouth every evening.    Marland Kitchen rOPINIRole (REQUIP) 0.5 MG tablet Take 0.5-1 mg by mouth 2 (two) times daily. For restless leg syndrome 1 IN THE MORNING AND 2 AT NIGHT    . sertraline (ZOLOFT) 50 MG tablet Take 50 mg by mouth at bedtime.      No current facility-administered medications for this visit.    Allergies:   Lipitor [atorvastatin], Other, Prednisone, and Limbitrol ds  [chlordiazepoxide-amitriptyline]    Social History:  The patient  reports that she has never smoked. She has never used smokeless tobacco. She reports that she does not drink alcohol and does not use drugs.   Family History:  The patient's family history includes ALS in her sister; Dementia in her mother and sister; Heart Problems in her father and mother; Lymphoma in her sister and sister; Parkinson's disease in her sister.    ROS:  General:no colds or fevers, no weight changes Skin:no rashes or ulcers HEENT:no blurred vision, no congestion CV:see HPI PUL:see HPI GI:no diarrhea constipation or melena, no indigestion GU:no hematuria, no dysuria MS:no joint pain, no claudication Neuro:no syncope, no lightheadedness Endo:no diabetes, no thyroid disease  Wt Readings from Last 3 Encounters:  02/07/20 216 lb 6.4 oz (98.2 kg)  02/02/20 215 lb 9.8 oz  (97.8 kg)  01/22/20 215 lb 9.6 oz (97.8 kg)     PHYSICAL EXAM: VS:  BP 134/76   Pulse (!) 49   Ht 5\' 4"  (1.626 m)   Wt 216 lb 6.4 oz (98.2 kg)   SpO2 96%   BMI 37.14 kg/m  , BMI Body mass index is 37.14 kg/m. General:Pleasant affect, NAD Skin:Warm and dry, brisk capillary refill HEENT:normocephalic, sclera clear, mucus membranes moist Neck:supple, no JVD, no  bruits  Heart:S1S2 RRR without murmur, gallup, rub or click Lungs:clear without rales, rhonchi, or wheezes KWI:OXBD, non tender, + BS, do not palpate liver spleen or masses Ext:no lower ext edema, 2+ pedal pulses, 2+ radial pulses Neuro:alert and oriented X 3, MAE, follows commands, + facial symmetry    EKG:  EKG is ordered today. The ekg ordered today demonstrates SB at 49 no acute ST changes when compared to other SR EKGs   Recent Labs: 01/09/2020: Magnesium 2.1; TSH 2.201 01/10/2020: ALT 21 02/02/2020: BUN 16; Creatinine, Ser 0.84; Hemoglobin 14.0; Platelets 215; Potassium 4.2; Sodium 139    Lipid Panel    Component Value Date/Time   CHOL 139 01/10/2020 0054   CHOL 159 12/13/2016 1233   TRIG 95 01/10/2020 0054   HDL 56 01/10/2020 0054   HDL 64 12/13/2016 1233   CHOLHDL 2.5 01/10/2020 0054   VLDL 19 01/10/2020 0054   LDLCALC 64 01/10/2020 0054   LDLCALC 65 12/13/2016 1233       Other studies Reviewed: Additional studies/ records that were reviewed today include: . 01/10/20 Echo 01/10/20: 1. Left ventricular ejection fraction, by estimation, is 60 to 65%. The  left ventricle has normal function. The left ventricle has no regional  wall motion abnormalities. There is moderate left ventricular hypertrophy.  Left ventricular diastolic  parameters are consistent with Grade I diastolic dysfunction (impaired  relaxation).  2. Right ventricular systolic function is normal. The right ventricular  size is normal. Tricuspid regurgitation signal is inadequate for assessing  PA pressure.  3. The mitral valve  is normal in structure. Trivial mitral valve  regurgitation. No evidence of mitral stenosis.  4. The aortic valve is normal in structure. Aortic valve regurgitation is  not visualized. No aortic stenosis is present.  5. The inferior vena cava is normal in size with greater than 50%  respiratory variability, suggesting right atrial pressure of 3 mmHg.    ASSESSMENT AND PLAN:  1.  PAF has had 2 episodes.  TSH normal. EF normal + LVH. She is now on eliquis without bleeding with CHA2DS2VASc of 3.  HR today on toprol XL 25 mg is 49, no room to increase. Discussed briefly with DOD Dr. Quentin Ore and will schedule appt with EP. Possible ablation.  In mean time if she goes back into a fib she should relax and take a hlf of BB and give it 45 min or so to see if she converts.  Then she should call us.   Hospital notes and ER notes reviewed.    2. OSA but has been unable to wear mask.  she will touch base with MD in  who ordered to see if another mask would help.    3.  CAD with PCI to RCA and LAD in 2007  No angina and hs Troponin only in 20s with admit with RVR.  Plavix was stopped but ASA continued.  4.  HLD LDL goal < 70 on statin and zetia LDL 64 in Oct.   5.  HTN controlled.    6. Hx of insomnia    Current medicines are reviewed with the patient today.  The patient Has no concerns regarding medicines.  The following changes have been made:  See above Labs/ tests ordered today include:see above  Disposition:   FU:  see above  Signed, [name], NP  02/07/2020 12:31 PM    [address], [place], [place] [address]  [place], [place] Phone: [phone]; Fax: [phone]

## 2020-02-07 ENCOUNTER — Ambulatory Visit (INDEPENDENT_AMBULATORY_CARE_PROVIDER_SITE_OTHER): Payer: Medicare Other | Admitting: Cardiology

## 2020-02-07 ENCOUNTER — Encounter: Payer: Self-pay | Admitting: Cardiology

## 2020-02-07 ENCOUNTER — Other Ambulatory Visit: Payer: Self-pay

## 2020-02-07 VITALS — BP 134/76 | HR 49 | Ht 64.0 in | Wt 216.4 lb

## 2020-02-07 DIAGNOSIS — E785 Hyperlipidemia, unspecified: Secondary | ICD-10-CM | POA: Diagnosis not present

## 2020-02-07 DIAGNOSIS — I480 Paroxysmal atrial fibrillation: Secondary | ICD-10-CM | POA: Diagnosis not present

## 2020-02-07 DIAGNOSIS — I2510 Atherosclerotic heart disease of native coronary artery without angina pectoris: Secondary | ICD-10-CM | POA: Diagnosis not present

## 2020-02-07 DIAGNOSIS — I10 Essential (primary) hypertension: Secondary | ICD-10-CM | POA: Diagnosis not present

## 2020-02-07 DIAGNOSIS — G4709 Other insomnia: Secondary | ICD-10-CM

## 2020-02-07 MED ORDER — METOPROLOL SUCCINATE ER 25 MG PO TB24
25.0000 mg | ORAL_TABLET | Freq: Every day | ORAL | 3 refills | Status: DC
Start: 2020-02-07 — End: 2020-02-17

## 2020-02-07 NOTE — Patient Instructions (Addendum)
Medication Instructions:   You may take an extra one-half tablet daily of your metoprolol succinate (Toprol XL) by mouth as needed for A-fib.  *If you need a refill on your cardiac medications before your next appointment, please call your pharmacy*   Lab Work: None If you have labs (blood work) drawn today and your tests are completely normal, you will receive your results only by: Marland Kitchen MyChart Message (if you have MyChart) OR . A paper copy in the mail If you have any lab test that is abnormal or we need to change your treatment, we will call you to review the results.   Testing/Procedures: None   Follow-Up: At [facility], you and your health needs are our priority.  As part of our continuing mission to provide you with exceptional heart care, we have created designated Provider Care Teams.  These Care Teams include your primary Cardiologist (physician) and Advanced Practice Providers (APPs -  Physician Assistants and Nurse Practitioners) who all work together to provide you with the care you need, when you need it.  We recommend signing up for the patient portal called "MyChart".  Sign up information is provided on this After Visit Summary.  MyChart is used to connect with patients for Virtual Visits (Telemedicine).  Patients are able to view lab/test results, encounter notes, upcoming appointments, etc.  Non-urgent messages can be sent to your provider as well.   To learn more about what you can do with MyChart, go to [email or web address].    Your next appointment:    04/15/2020 at 1:00 PM    The format for your next appointment:   In Person  Provider:   Fransico Him, MD   Other Instructions You have been referred to see Dr Quentin Ore. You will receive a call from [facility] to schedule an appointment.

## 2020-02-08 ENCOUNTER — Other Ambulatory Visit: Payer: Self-pay | Admitting: Cardiology

## 2020-02-10 NOTE — Telephone Encounter (Signed)
Pt's age 74, wt 98.2 kg, SCr 0.84, CrCl 89.71, last ov w/ LI 02/07/20.

## 2020-02-17 ENCOUNTER — Other Ambulatory Visit: Payer: Self-pay

## 2020-02-17 ENCOUNTER — Encounter: Payer: Self-pay | Admitting: Cardiology

## 2020-02-17 ENCOUNTER — Ambulatory Visit (INDEPENDENT_AMBULATORY_CARE_PROVIDER_SITE_OTHER): Payer: Medicare Other | Admitting: Cardiology

## 2020-02-17 VITALS — BP 126/74 | HR 52 | Ht 64.0 in | Wt 215.6 lb

## 2020-02-17 DIAGNOSIS — I480 Paroxysmal atrial fibrillation: Secondary | ICD-10-CM | POA: Diagnosis not present

## 2020-02-17 DIAGNOSIS — I5033 Acute on chronic diastolic (congestive) heart failure: Secondary | ICD-10-CM

## 2020-02-17 DIAGNOSIS — I10 Essential (primary) hypertension: Secondary | ICD-10-CM | POA: Diagnosis not present

## 2020-02-17 MED ORDER — AMIODARONE HCL 200 MG PO TABS: ORAL_TABLET | ORAL | 3 refills | Status: DC

## 2020-02-17 MED ORDER — METOPROLOL SUCCINATE ER 25 MG PO TB24: 25.0000 mg | ORAL_TABLET | Freq: Every day | ORAL | 3 refills | Status: DC | PRN

## 2020-02-17 NOTE — Progress Notes (Signed)
Electrophysiology Office Note:    Date:  02/17/2020   ID:  ANJOLINA BYRER, Alferd Apa 11-28-1945, MRN [id number]  PCP:  Gaynelle Arabian, Young  [facility] Cardiologist:  Fransico Him, Young  [facility] Electrophysiologist:  Vickie Epley, Young   Referring Young: Isaiah Serge, NP   Chief Complaint: Atrial fibrillation  History of Present Illness:    [name] is a 74 y.o. female who presents for an evaluation of atrial fibrillation at the request of [name], NP. Their medical history includes dementia, coronary artery disease post PCI to the LAD and RCA, depression, GERD, hypertension, hyperlipidemia, obstructive sleep apnea.  She was last seen by [name] on February 07, 2020.    She was admitted for atrial fibrillation in October of this year.  The episode of atrial fibrillation was symptomatic with chest tightness.  When she presented to the emergency department she was found to be in atrial fibrillation with rapid ventricular rates up to 160 bpm.  The atrial fibrillation converted back to sinus rhythm in the emergency department.  She is maintained on Eliquis for stroke prophylaxis.  She was back in the emergency department on February 02, 2020 with atrial fibrillation with rapid ventricular rates.  This time, she was symptomatic with jitteriness and palpitations.  When she saw [name] on November 12, she was in sinus bradycardia with a heart rate of 49 bpm.  She reported to [name] that she was not adherent to CPAP therapy due to a poor fitting mask.  Patient is here today with her daughter.  Patient tells me that when she has atrial fibrillation she has very vague symptoms mostly palpitations.  No syncope or presyncope.  No chest pain.  She is currently living independently but her daughter indicates that that may change.  Her daughter lives 2 miles away and is very involved with helping her take her medications.  Past Medical History:  Diagnosis Date  . Arthritis   .  Coronary artery disease    s/p PCI of LAD/RCA, cath 2007 with 30% mid LAD, 20% OM1, 70% distal left circ too small for PCI, 90% acute RV mariginal too small for PCI  . Depression   . Diastolic dysfunction   . GERD (gastroesophageal reflux disease)   . Headache    hx migraines 32 yrs ago  . Hyperlipemia   . Hypertension   . OSA (obstructive sleep apnea)   . PAF (paroxysmal atrial fibrillation) ([place])   . RLS (restless legs syndrome)     Past Surgical History:  Procedure Laterality Date  . both wrist surgery  1985  . CARDIAC CATHETERIZATION    . CHOLECYSTECTOMY    . COLONOSCOPY WITH PROPOFOL N/A 08/23/2016   Procedure: COLONOSCOPY WITH PROPOFOL;  Surgeon: [name];  Location: [facility];  Service: Endoscopy;  Laterality: N/A;  . CORONARY STENT PLACEMENT  2006   x2  . LUMBAR DISC SURGERY    . ROTATOR CUFF REPAIR Left    shoulder  . TENDON REPAIR Right    ankle  . TOTAL KNEE ARTHROPLASTY Left 12/21/2012   Procedure: LEFT TOTAL KNEE ARTHROPLASTY;  Surgeon: [name];  Location: [facility];  Service: Orthopedics;  Laterality: Left;    Current Medications: Current Meds  Medication Sig  . Calcium Carbonate-Vitamin D (CALCIUM 600+D) 600-400 MG-UNIT per tablet Take 1 tablet by mouth daily.  . Carboxymethylcellul-Glycerin (CLEAR EYES FOR DRY EYES OP) Place 1 drop into both eyes 2 (two) times daily.  Marland Kitchen  Cholecalciferol (VITAMIN D) 2000 units CAPS Take 2,000 Units by mouth daily.  . Collagen Matrix Fenest, [place], 6X9CM SHEE Apply topically daily.  Marland Kitchen ELIQUIS 5 MG TABS tablet TAKE 1 TABLET BY MOUTH TWICE DAILY  . ezetimibe (ZETIA) 10 MG tablet Take 10 mg by mouth every evening.   . isosorbide mononitrate (IMDUR) 60 MG 24 hr tablet Take 60 mg by mouth daily.  . magnesium oxide (MAG-OX) 400 MG tablet Take 400 mg by mouth daily.  . Melatonin 10 MG TABS Take 1 tablet by mouth daily as needed (for sleep).  . Omega-3 Fatty Acids (FISH OIL PO) Take 1 capsule by mouth daily.   .  pantoprazole (PROTONIX) 40 MG tablet Take 40 mg by mouth daily.  . pravastatin (PRAVACHOL) 80 MG tablet Take 80 mg by mouth every evening.  Marland Kitchen rOPINIRole (REQUIP) 0.5 MG tablet Take 0.5-1 mg by mouth 2 (two) times daily. For restless leg syndrome 1 IN THE MORNING AND 2 AT NIGHT  . sertraline (ZOLOFT) 50 MG tablet Take 50 mg by mouth at bedtime.   . [DISCONTINUED] metoprolol succinate (TOPROL-XL) 25 MG 24 hr tablet Take 1 tablet (25 mg total) by mouth daily. May take an additional one half tablet (12.5 mg) by mouth daily if needed for A-fib     Allergies:   Lipitor [atorvastatin], Other, Prednisone, and Limbitrol ds  [chlordiazepoxide-amitriptyline]   Social History   Socioeconomic History  . Marital status: Legally Separated    Spouse name: [name]  . Number of children: 1  . Years of education: GED  . Highest education level: Not on file  Occupational History  . Occupation: Retired    Fish farm manager: UNEMPLOYED  Tobacco Use  . Smoking status: Never Smoker  . Smokeless tobacco: Never Used  Vaping Use  . Vaping Use: Never used  Substance and Sexual Activity  . Alcohol use: No  . Drug use: No  . Sexual activity: Not on file  Other Topics Concern  . Not on file  Social History Narrative   Pt lives at home alone.   Caffeine: 2-3 cups of coffee daily   Her daughter manages her medications   Social Determinants of Health   Financial Resource Strain:   . Difficulty of Paying Living Expenses: Not on file  Food Insecurity:   . Worried About Charity fundraiser in the Last Year: Not on file  . Ran Out of Food in the Last Year: Not on file  Transportation Needs:   . Lack of Transportation (Medical): Not on file  . Lack of Transportation (Non-Medical): Not on file  Physical Activity:   . Days of Exercise per Week: Not on file  . Minutes of Exercise per Session: Not on file  Stress:   . Feeling of Stress : Not on file  Social Connections:   . Frequency of Communication with Friends  and Family: Not on file  . Frequency of Social Gatherings with Friends and Family: Not on file  . Attends Religious Services: Not on file  . Active Member of Clubs or Organizations: Not on file  . Attends Archivist Meetings: Not on file  . Marital Status: Not on file     Family History: The patient's family history includes ALS in her sister; Dementia in her mother and sister; Heart Problems in her father and mother; Lymphoma in her sister and sister; Parkinson's disease in her sister.  ROS:   Please see the history of present illness.  All other systems reviewed and are negative.  EKGs/Labs/Other Studies Reviewed:    The following studies were reviewed today: Prior notes, echo  EKG on November 7 personally reviewed shows atrial fibrillation with rapid ventricular rates EKG on January 09, 2020 shows atrial fibrillation with rapid ventricular rates  February 07, 2020 ECG personally reviewed Sinus rhythm with early repolarization in the anterior lateral precordium  February 07, 2020 shows sinus bradycardia   January 10, 2020 echo personally reviewed Left ventricular function normal, 60% Right ventricular function normal No significant valvular abnormalities    Recent Labs: 01/09/2020: Magnesium 2.1; TSH 2.201 01/10/2020: ALT 21 02/02/2020: BUN 16; Creatinine, Ser 0.84; Hemoglobin 14.0; Platelets 215; Potassium 4.2; Sodium 139  Recent Lipid Panel    Component Value Date/Time   CHOL 139 01/10/2020 0054   CHOL 159 12/13/2016 1233   TRIG 95 01/10/2020 0054   HDL 56 01/10/2020 0054   HDL 64 12/13/2016 1233   CHOLHDL 2.5 01/10/2020 0054   VLDL 19 01/10/2020 0054   LDLCALC 64 01/10/2020 0054   LDLCALC 65 12/13/2016 1233    Physical Exam:    VS:  BP 126/74   Pulse (!) 52   Ht 5\' 4"  (1.626 m)   Wt 215 lb 9.6 oz (97.8 kg)   SpO2 97%   BMI 37.01 kg/m     Wt Readings from Last 3 Encounters:  02/17/20 215 lb 9.6 oz (97.8 kg)  02/07/20 216 lb 6.4 oz (98.2  kg)  02/02/20 215 lb 9.8 oz (97.8 kg)     GEN:  Well nourished, well developed in no acute distress HEENT: Normal NECK: No JVD; No carotid bruits LYMPHATICS: No lymphadenopathy CARDIAC: RRR, no murmurs, rubs, gallops RESPIRATORY:  Clear to auscultation without rales, wheezing or rhonchi  ABDOMEN: Soft, non-tender, non-distended MUSCULOSKELETAL:  No edema; No deformity  SKIN: Warm and dry NEUROLOGIC: Alert.  Displays some trouble recalling information.  Dyskinesia type muscle movements?  Tardive PSYCHIATRIC:  Normal affect   ASSESSMENT:    1. PAF (paroxysmal atrial fibrillation) ([place])   2. Acute on chronic diastolic CHF (congestive heart failure) ([place])   3. Essential hypertension    PLAN:    In order of problems listed above:  1. Symptomatic paroxysmal atrial fibrillation Patient has had multiple presentations to the emergency department for symptomatic paroxysms of atrial fibrillation requiring IV medications. Currently her medications include metoprolol and Eliquis. Further titration of her metoprolol has been limited due to sinus bradycardia.  Thankfully she has never had syncope or presyncope.  Her symptoms are mild.  She is tolerated the blood thinner well.  I would like to start an antiarrhythmic to hopefully avoid the paroxysms of atrial fibrillation.  Given her other medications and comorbidities, favor using amiodarone.  We will start 200 mg twice daily for 7 days then decrease to 200 mg once daily.  To help avoid significant bradycardia, will discontinue her metoprolol for now.  I have advised the daughter to use as needed doses of metoprolol succinate 25 mg if she experiences an episode of atrial fibrillation.  We discussed at length the indications for hospital presentation with atrial fibrillation.  She should only present to the hospital if she is feeling chest pain, shortness of breath, presyncope or syncope.  If she has an episode of atrial fibrillation I instructed her  to give [place] a call and we can direct her to the atrial fibrillation clinic for early appointment.  I do not think that Ms. Winterhalter is a candidate  for ablation therapy.  I discussed at length the risk of bradycardia given her resting heart rate and the addition of amiodarone today.  The daughter knows the warning signs to look out for for bradycardia and will let us know if these develop.  I discussed the possibility of a pacemaker implant in the future to help manage her tachycardia-bradycardia syndrome.  2.  Hypertension Well-controlled   Medication Adjustments/Labs and Tests Ordered: Current medicines are reviewed at length with the patient today.  Concerns regarding medicines are outlined above.  No orders of the defined types were placed in this encounter.  Meds ordered this encounter  Medications  . metoprolol succinate (TOPROL XL) 25 MG 24 hr tablet    Sig: Take 1 tablet (25 mg total) by mouth daily as needed (take one tablet by mouth daily for breakthrough palpitations).    Dispense:  90 tablet    Refill:  3    Do not fill     Signed, Lars Mage, Young, [facility]  02/17/2020 11:13 AM    Electrophysiology [facility]

## 2020-02-17 NOTE — Patient Instructions (Addendum)
Medication Instructions:  Your physician has recommended you make the following change in your medication:  1.  STOP taking metoprolol tartrate   2.  IF you have an episode of afib- Take metoprolol succinate 25 mg- one tablet by mouth in a 24 hour period  3.  START taking amiodarone 200 mg- Take 1 tablet by mouth twice a day for 7 days.  4.  AFTER 7 days REDUCE your amiodarone 200 mg- one tablet by mouth daily.   Labwork: None ordered.  Testing/Procedures: None ordered.  Follow-Up: Your physician wants you to follow-up in: 6-8 weeks with Dr. Quentin Ore.    April 09, 2019 at 2:45 pm at the [facility] office   Any Other Special Instructions Will Be Listed Below (If Applicable).  If you need a refill on your cardiac medications before your next appointment, please call your pharmacy.   AFIB CLINIC INFORMATION: Phone number: [phone]   AFTER hours:  [phone]   Amiodarone tablets What is this medicine? AMIODARONE (a MEE oh da rone) is an antiarrhythmic drug. It helps make your heart beat regularly. Because of the side effects caused by this medicine, it is only used when other medicines have not worked. It is usually used for heartbeat problems that may be life threatening. This medicine may be used for other purposes; ask your health care provider or pharmacist if you have questions. COMMON BRAND NAME(S): Cordarone, Pacerone What should I tell my health care provider before I take this medicine? They need to know if you have any of these conditions:  liver disease  lung disease  other heart problems  thyroid disease  an unusual or allergic reaction to amiodarone, iodine, other medicines, foods, dyes, or preservatives  pregnant or trying to get pregnant  breast-feeding How should I use this medicine? Take this medicine by mouth with a glass of water. Follow the directions on the prescription label. You can take this medicine with or without food. However,  you should always take it the same way each time. Take your doses at regular intervals. Do not take your medicine more often than directed. Do not stop taking except on the advice of your doctor or health care professional. A special MedGuide will be given to you by the pharmacist with each prescription and refill. Be sure to read this information carefully each time. Talk to your pediatrician regarding the use of this medicine in children. Special care may be needed. Overdosage: If you think you have taken too much of this medicine contact a poison control center or emergency room at once. NOTE: This medicine is only for you. Do not share this medicine with others. What if I miss a dose? If you miss a dose, take it as soon as you can. If it is almost time for your next dose, take only that dose. Do not take double or extra doses. What may interact with this medicine? Do not take this medicine with any of the following medications:  abarelix  apomorphine  arsenic trioxide  certain antibiotics like erythromycin, gemifloxacin, levofloxacin, pentamidine  certain medicines for depression like amoxapine, tricyclic antidepressants  certain medicines for fungal infections like fluconazole, itraconazole, ketoconazole, posaconazole, voriconazole  certain medicines for irregular heart beat like disopyramide, dronedarone, ibutilide, propafenone, sotalol  certain medicines for malaria like chloroquine, halofantrine  cisapride  droperidol  haloperidol  haw[place]orn  maprotiline  methadone  phenothiazines like chlorpromazine, mesoridazine, thioridazine  pimozide  ranolazine  red yeast rice  vardenafil This medicine may also  interact with the following medications:  antiviral medicines for HIV or AIDS  certain medicines for blood pressure, heart disease, irregular heart beat  certain medicines for cholesterol like atorvastatin, cerivastatin, lovastatin, simvastatin  certain  medicines for hepatitis C like sofosbuvir and ledipasvir; sofosbuvir  certain medicines for seizures like phenytoin  certain medicines for thyroid problems  certain medicines that treat or prevent blood clots like warfarin  cholestyramine  cimetidine  clopidogrel  cyclosporine  dextromethorphan  diuretics  dofetilide  fentanyl  general anesthetics  grapefruit juice  lidocaine  loratadine  methotrexate  other medicines that prolong the QT interval (cause an abnormal heart rhythm)  procainamide  quinidine  rifabutin, rifampin, or rifapentine  St. John's Wort  trazodone  ziprasidone This list may not describe all possible interactions. Give your health care provider a list of all the medicines, herbs, non-prescription drugs, or dietary supplements you use. Also tell them if you smoke, drink alcohol, or use illegal drugs. Some items may interact with your medicine. What should I watch for while using this medicine? Your condition will be monitored closely when you first begin therapy. Often, this drug is first started in a hospital or other monitored health care setting. Once you are on maintenance therapy, visit your doctor or health care professional for regular checks on your progress. Because your condition and use of this medicine carry some risk, it is a good idea to carry an identification card, necklace or bracelet with details of your condition, medications, and doctor or health care professional. Dennis Bast may get drowsy or dizzy. Do not drive, use machinery, or do anything that needs mental alertness until you know how this medicine affects you. Do not stand or sit up quickly, especially if you are an older patient. This reduces the risk of dizzy or fainting spells. This medicine can make you more sensitive to the sun. Keep out of the sun. If you cannot avoid being in the sun, wear protective clothing and use sunscreen. Do not use sun lamps or tanning  beds/booths. You should have regular eye exams before and during treatment. Call your doctor if you have blurred vision, see halos, or your eyes become sensitive to light. Your eyes may get dry. It may be helpful to use a lubricating eye solution or artificial tears solution. If you are going to have surgery or a procedure that requires contrast dyes, tell your doctor or health care professional that you are taking this medicine. What side effects may I notice from receiving this medicine? Side effects that you should report to your doctor or health care professional as soon as possible:  allergic reactions like skin rash, itching or hives, swelling of the face, lips, or tongue  blue-gray coloring of the skin  blurred vision, seeing blue green halos, increased sensitivity of the eyes to light  breathing problems  chest pain  dark urine  fast, irregular heartbeat  feeling faint or light-headed  intolerance to heat or cold  nausea or vomiting  pain and swelling of the scrotum  pain, tingling, numbness in feet, hands  redness, blistering, peeling or loosening of the skin, including inside the mouth  spitting up blood  stomach pain  sweating  unusual or uncontrolled movements of body  unusually weak or tired  weight gain or loss  yellowing of the eyes or skin Side effects that usually do not require medical attention (report to your doctor or health care professional if they continue or are bothersome):  change in  sex drive or performance  constipation  dizziness  headache  loss of appetite  trouble sleeping This list may not describe all possible side effects. Call your doctor for medical advice about side effects. You may report side effects to FDA at 1-800-FDA-1088. Where should I keep my medicine? Keep out of the reach of children. Store at room temperature between 20 and 25 degrees C (68 and 77 degrees F). Protect from light. Keep container tightly  closed. Throw away any unused medicine after the expiration date. NOTE: This sheet is a summary. It may not cover all possible information. If you have questions about this medicine, talk to your doctor, pharmacist, or health care provider.  2020 Elsevier/Gold Standard (2018-02-14 13:44:04)

## 2020-02-24 ENCOUNTER — Telehealth (HOSPITAL_COMMUNITY): Payer: Self-pay | Admitting: *Deleted

## 2020-02-24 MED ORDER — DILTIAZEM HCL 30 MG PO TABS: ORAL_TABLET | ORAL | 1 refills | Status: DC

## 2020-02-24 NOTE — Telephone Encounter (Signed)
Patient's daughter, renee, called in stating pt went into afib this morning. At first she was having her normal symptoms of chest pressure/sob with the episode. HRs ranging from 40-130. Once she took her normal morning medications her symptoms subsided but heart rates are continuing to fluctuate. Discussed with Roderic Palau NP will call in cardizem 30mg  tablets PRN since history of bradycardia. Daughter verbalized understanding of appropriate use of PRN cardizem and mechanism of amiodarone/loading process. If breakthrough continues to be frequent she will let our office know.  With today's episode her HR is now 72 with some heart rates up to 130 but quickly goes back down she will hold off on any PRN medication at this point only if HRs sustain.

## 2020-02-25 ENCOUNTER — Other Ambulatory Visit (HOSPITAL_COMMUNITY): Payer: Self-pay | Admitting: *Deleted

## 2020-02-25 MED ORDER — DILTIAZEM HCL 30 MG PO TABS
ORAL_TABLET | ORAL | 1 refills | Status: DC
Start: 2020-02-25 — End: 2022-10-29

## 2020-02-27 ENCOUNTER — Telehealth (HOSPITAL_COMMUNITY): Payer: Self-pay | Admitting: *Deleted

## 2020-02-27 ENCOUNTER — Other Ambulatory Visit: Payer: Self-pay | Admitting: *Deleted

## 2020-02-27 MED ORDER — APIXABAN 5 MG PO TABS
5.0000 mg | ORAL_TABLET | Freq: Two times a day (BID) | ORAL | 2 refills | Status: DC
Start: 2020-02-27 — End: 2020-11-05

## 2020-02-27 NOTE — Telephone Encounter (Signed)
Pt daughter called stating patient having AF today HRs have been mostly over 100 and her BP is very elevated. Pt has only taken her morning medications. Instructed to take 30mg  of cardizem now and call me back around 4 with update of symptoms. Patient and daughter very anxious encouraged to relax so that medication will help.

## 2020-02-27 NOTE — Telephone Encounter (Signed)
Eliquis 5mg  pape refill request received. Patient is 74 years old, weight-97.8kg, Crea-0.84 on 02/02/2020, Diagnosis-Afib, and last seen by Dr. Quentin Ore on 02/17/2020. Dose is appropriate based on dosing criteria. Will send in refill to requested pharmacy.

## 2020-02-28 MED ORDER — AMLODIPINE BESYLATE 2.5 MG PO TABS: 2.5000 mg | ORAL_TABLET | Freq: Every day | ORAL | 3 refills | Status: DC

## 2020-02-28 NOTE — Telephone Encounter (Signed)
Daughter states prn cardizem converted her HR back to normal rhythm but concerned regarding elevated BP. Some BPs at home running 170s/100. Discussed with Roderic Palau NP will start amlodipine 2.5mg  once a day - call in 1 week with update of BP response.

## 2020-03-26 ENCOUNTER — Telehealth: Payer: Self-pay

## 2020-03-26 DIAGNOSIS — E785 Hyperlipidemia, unspecified: Secondary | ICD-10-CM

## 2020-03-26 NOTE — Telephone Encounter (Signed)
Lab work received from patient's PCP. Per Dr. Mayford Knife the patient needs to come in for a repeat fasting lipid. Left message for patient to call back and schedule labs.

## 2020-04-08 ENCOUNTER — Other Ambulatory Visit: Payer: Self-pay

## 2020-04-08 ENCOUNTER — Ambulatory Visit (INDEPENDENT_AMBULATORY_CARE_PROVIDER_SITE_OTHER): Payer: Medicare Other | Admitting: Cardiology

## 2020-04-08 ENCOUNTER — Encounter: Payer: Self-pay | Admitting: Cardiology

## 2020-04-08 VITALS — BP 154/64 | HR 68 | Ht 64.0 in | Wt 218.0 lb

## 2020-04-08 DIAGNOSIS — I10 Essential (primary) hypertension: Secondary | ICD-10-CM

## 2020-04-08 DIAGNOSIS — I480 Paroxysmal atrial fibrillation: Secondary | ICD-10-CM

## 2020-04-08 MED ORDER — AMLODIPINE BESYLATE 5 MG PO TABS: 5.0000 mg | ORAL_TABLET | Freq: Every day | ORAL | 3 refills | Status: DC

## 2020-04-08 NOTE — Progress Notes (Signed)
Electrophysiology Office Follow up Visit Note:    Date:  04/08/2020   ID:  [name], DOB 1946-02-13, MRN [id number]  PCP:  Gaynelle Arabian, MD  [facility] Cardiologist:  Fransico Him, MD  [facility] Electrophysiologist:  Vickie Epley, MD    Interval History:    [name] is a 75 y.o. female who presents for a follow up visit. I last saw her in clinic November 22nd 2021 for symptomatic paroxysmal atrial fibrillation. At that appointment we started the patient on amiodarone. She has done well on this medication without recurrence of atrial fibrillation. She continues to take Eliquis twice daily without bleeding issues. She is with her daughter in clinic today.     Past Medical History:  Diagnosis Date  . Arthritis   . Coronary artery disease    s/p PCI of LAD/RCA, cath 2007 with 30% mid LAD, 20% OM1, 70% distal left circ too small for PCI, 90% acute RV mariginal too small for PCI  . Depression   . Diastolic dysfunction   . GERD (gastroesophageal reflux disease)   . Headache    hx migraines 32 yrs ago  . Hyperlipemia   . Hypertension   . OSA (obstructive sleep apnea)   . PAF (paroxysmal atrial fibrillation) ([place])   . RLS (restless legs syndrome)     Past Surgical History:  Procedure Laterality Date  . both wrist surgery  1985  . CARDIAC CATHETERIZATION    . CHOLECYSTECTOMY    . COLONOSCOPY WITH PROPOFOL N/A 08/23/2016   Procedure: COLONOSCOPY WITH PROPOFOL;  Surgeon: Garlan Fair, MD;  Location: [facility];  Service: Endoscopy;  Laterality: N/A;  . CORONARY STENT PLACEMENT  2006   x2  . LUMBAR DISC SURGERY    . ROTATOR CUFF REPAIR Left    shoulder  . TENDON REPAIR Right    ankle  . TOTAL KNEE ARTHROPLASTY Left 12/21/2012   Procedure: LEFT TOTAL KNEE ARTHROPLASTY;  Surgeon: Augustin Schooling, MD;  Location: [place];  Service: Orthopedics;  Laterality: Left;    Current Medications: Current Meds  Medication Sig  . amiodarone (PACERONE)  200 MG tablet Take 1 tablet by mouth daily.  Marland Kitchen amLODipine (NORVASC) 2.5 MG tablet Take 1 tablet (2.5 mg total) by mouth daily.  Marland Kitchen apixaban (ELIQUIS) 5 MG TABS tablet Take 1 tablet (5 mg total) by mouth 2 (two) times daily.  . Calcium Carbonate-Vitamin D 600-400 MG-UNIT tablet Take 1 tablet by mouth daily.  . Carboxymethylcellul-Glycerin (CLEAR EYES FOR DRY EYES OP) Place 1 drop into both eyes 2 (two) times daily.  . Cholecalciferol (VITAMIN D) 2000 units CAPS Take 2,000 Units by mouth daily.  . Collagen Matrix Fenest, [place], 6X9CM SHEE Apply topically daily.  Marland Kitchen diltiazem (CARDIZEM) 30 MG tablet Take 1 tablet every 4 hours AS NEEDED for AFIB heart rate >100  . ezetimibe (ZETIA) 10 MG tablet Take 10 mg by mouth every evening.   . isosorbide mononitrate (IMDUR) 60 MG 24 hr tablet Take 60 mg by mouth daily.  . Magnesium 500 MG TABS Take 1 tablet by mouth daily in the afternoon.  . magnesium oxide (MAG-OX) 400 MG tablet Take 400 mg by mouth daily.  . Melatonin 10 MG TABS Take 1 tablet by mouth daily as needed (for sleep).  . metoprolol succinate (TOPROL XL) 25 MG 24 hr tablet Take 1 tablet (25 mg total) by mouth daily as needed (take one tablet by mouth daily for breakthrough palpitations).  . Omega-3  Fatty Acids (FISH OIL PO) Take 1 capsule by mouth daily.   . pantoprazole (PROTONIX) 40 MG tablet Take 40 mg by mouth daily.  . pravastatin (PRAVACHOL) 80 MG tablet Take 80 mg by mouth every evening.  Marland Kitchen rOPINIRole (REQUIP) 0.5 MG tablet Take 0.5-1 mg by mouth 2 (two) times daily. For restless leg syndrome 1 IN THE MORNING AND 2 AT NIGHT  . sertraline (ZOLOFT) 50 MG tablet Take 50 mg by mouth at bedtime.   . [DISCONTINUED] amiodarone (PACERONE) 200 MG tablet Take 1 tablet (200 mg total) by mouth 2 (two) times daily for 7 days, THEN 1 tablet (200 mg total) daily.     Allergies:   Lipitor [atorvastatin], Other, Prednisone, and Limbitrol ds  [chlordiazepoxide-amitriptyline]   Social History    Socioeconomic History  . Marital status: Legally Separated    Spouse name: [name]  . Number of children: 1  . Years of education: GED  . Highest education level: Not on file  Occupational History  . Occupation: Retired    Fish farm manager: UNEMPLOYED  Tobacco Use  . Smoking status: Never Smoker  . Smokeless tobacco: Never Used  Vaping Use  . Vaping Use: Never used  Substance and Sexual Activity  . Alcohol use: No  . Drug use: No  . Sexual activity: Not on file  Other Topics Concern  . Not on file  Social History Narrative   Pt lives at home alone.   Caffeine: 2-3 cups of coffee daily   Her daughter manages her medications   Social Determinants of Health   Financial Resource Strain: Not on file  Food Insecurity: Not on file  Transportation Needs: Not on file  Physical Activity: Not on file  Stress: Not on file  Social Connections: Not on file     Family History: The patient's family history includes ALS in her sister; Dementia in her mother and sister; Heart Problems in her father and mother; Lymphoma in her sister and sister; Parkinson's disease in her sister.  ROS:   Please see the history of present illness.    All other systems reviewed and are negative.  EKGs/Labs/Other Studies Reviewed:    The following studies were reviewed today:   EKG:  The ekg ordered today demonstrates sinus rhythm  Recent Labs: 01/09/2020: Magnesium 2.1; TSH 2.201 01/10/2020: ALT 21 02/02/2020: BUN 16; Creatinine, Ser 0.84; Hemoglobin 14.0; Platelets 215; Potassium 4.2; Sodium 139  Recent Lipid Panel    Component Value Date/Time   CHOL 139 01/10/2020 0054   CHOL 159 12/13/2016 1233   TRIG 95 01/10/2020 0054   HDL 56 01/10/2020 0054   HDL 64 12/13/2016 1233   CHOLHDL 2.5 01/10/2020 0054   VLDL 19 01/10/2020 0054   LDLCALC 64 01/10/2020 0054   LDLCALC 65 12/13/2016 1233    Physical Exam:    VS:  BP (!) 154/64   Pulse 68   Ht 5\' 4"  (1.626 m)   Wt 218 lb (98.9 kg)   SpO2 97%    BMI 37.42 kg/m     Wt Readings from Last 3 Encounters:  04/08/20 218 lb (98.9 kg)  02/17/20 215 lb 9.6 oz (97.8 kg)  02/07/20 216 lb 6.4 oz (98.2 kg)     GEN:  Well nourished, well developed in no acute distress HEENT: Normal NECK: No JVD; No carotid bruits LYMPHATICS: No lymphadenopathy CARDIAC: RRR, no murmurs, rubs, gallops RESPIRATORY:  Clear to auscultation without rales, wheezing or rhonchi  ABDOMEN: Soft, non-tender, non-distended MUSCULOSKELETAL:  No  edema; No deformity  SKIN: Warm and dry NEUROLOGIC:  Alert. Difficulty with recall. Dyskinesia type muscle movements, possibly tardive dyskinesia. PSYCHIATRIC:  Normal affect   ASSESSMENT:    1. Paroxysmal atrial fibrillation (HCC)   2. Primary hypertension    PLAN:    In order of problems listed above:  1. Management paroxysmal atrial fibrillation Maintaining sinus rhythm while on amiodarone. We will check a complete metabolic panel, TSH and free T4. Follow-up in 6 months  2. Hypertension Above goal during today's visit. Using amlodipine but increase the dose to 5 mg once a day. If after 2 weeks the systolic blood pressure remains greater than 150 mmHg, she will increase the amlodipine to 10 mg by mouth daily.  Medication Adjustments/Labs and Tests Ordered: Current medicines are reviewed at length with the patient today.  Concerns regarding medicines are outlined above.  Orders Placed This Encounter  Procedures  . EKG 12-Lead   No orders of the defined types were placed in this encounter.    Signed, Lars Mage, MD, [facility]  04/08/2020 3:09 PM    Electrophysiology [facility]

## 2020-04-08 NOTE — Patient Instructions (Addendum)
Medication Instructions:  Your physician has recommended you make the following change in your medication:   1.  START taking amlodipine 5 mg- Take one tablet by mouth daily for 2 weeks.  IF after 2 weeks your systolic blood pressure (top number) remains GREATER than 150 THEN---INCREASE your amlodipine to 10 mg by mouth daily.   Labwork: You will get lab work today:  CMP, TSH and free T4  Testing/Procedures: None ordered.  Follow-Up: Your physician wants you to follow-up in: 6 months with Dr. Quentin Ore.  You will receive a reminder letter in the mail two months in advance. If you don't receive a letter, please call our office to schedule the follow-up appointment.   Any Other Special Instructions Will Be Listed Below (If Applicable).  If you need a refill on your cardiac medications before your next appointment, please call your pharmacy.

## 2020-04-09 LAB — COMPREHENSIVE METABOLIC PANEL
ALT: 18 IU/L (ref 0–32)
AST: 33 IU/L (ref 0–40)
Albumin/Globulin Ratio: 1.5 (ref 1.2–2.2)
Albumin: 4.3 g/dL (ref 3.7–4.7)
Alkaline Phosphatase: 81 IU/L (ref 44–121)
BUN/Creatinine Ratio: 15 (ref 12–28)
BUN: 13 mg/dL (ref 8–27)
Bilirubin Total: 0.6 mg/dL (ref 0.0–1.2)
CO2: 22 mmol/L (ref 20–29)
Calcium: 10.5 mg/dL — ABNORMAL HIGH (ref 8.7–10.3)
Chloride: 101 mmol/L (ref 96–106)
Creatinine, Ser: 0.84 mg/dL (ref 0.57–1.00)
GFR calc Af Amer: 79 mL/min/{1.73_m2} (ref >59)
GFR calc non Af Amer: 68 mL/min/{1.73_m2} (ref >59)
Globulin, Total: 2.8 g/dL (ref 1.5–4.5)
Glucose: 92 mg/dL (ref 65–99)
Potassium: 3.7 mmol/L (ref 3.5–5.2)
Sodium: 138 mmol/L (ref 134–144)
Total Protein: 7.1 g/dL (ref 6.0–8.5)

## 2020-04-09 LAB — TSH: TSH: 2.54 u[IU]/mL (ref 0.450–4.500)

## 2020-04-09 LAB — T4, FREE: Free T4: 1.38 ng/dL (ref 0.82–1.77)

## 2020-04-15 ENCOUNTER — Ambulatory Visit: Payer: TRICARE For Life (TFL) | Admitting: Cardiology

## 2020-04-16 ENCOUNTER — Other Ambulatory Visit: Payer: Medicare Other

## 2020-04-16 ENCOUNTER — Ambulatory Visit: Payer: TRICARE For Life (TFL) | Admitting: Cardiology

## 2020-04-20 DIAGNOSIS — Z20822 Contact with and (suspected) exposure to covid-19: Secondary | ICD-10-CM | POA: Diagnosis not present

## 2020-04-20 DIAGNOSIS — U071 COVID-19: Secondary | ICD-10-CM | POA: Diagnosis not present

## 2020-04-27 DIAGNOSIS — U071 COVID-19: Secondary | ICD-10-CM | POA: Diagnosis not present

## 2020-05-05 ENCOUNTER — Ambulatory Visit: Payer: TRICARE For Life (TFL) | Admitting: Cardiology

## 2020-05-06 ENCOUNTER — Other Ambulatory Visit: Payer: Medicare Other

## 2020-05-06 ENCOUNTER — Ambulatory Visit: Payer: TRICARE For Life (TFL) | Admitting: Cardiology

## 2020-06-01 ENCOUNTER — Ambulatory Visit: Payer: Medicare Other | Admitting: Cardiology

## 2020-06-01 ENCOUNTER — Other Ambulatory Visit: Payer: Medicare Other

## 2020-06-03 ENCOUNTER — Ambulatory Visit (INDEPENDENT_AMBULATORY_CARE_PROVIDER_SITE_OTHER): Payer: Medicare Other | Admitting: Cardiology

## 2020-06-03 ENCOUNTER — Other Ambulatory Visit: Payer: Self-pay

## 2020-06-03 ENCOUNTER — Encounter: Payer: Self-pay | Admitting: Cardiology

## 2020-06-03 ENCOUNTER — Other Ambulatory Visit: Payer: Medicare Other

## 2020-06-03 VITALS — BP 148/88 | HR 68 | Ht 64.0 in | Wt 214.0 lb

## 2020-06-03 DIAGNOSIS — E7800 Pure hypercholesterolemia, unspecified: Secondary | ICD-10-CM | POA: Diagnosis not present

## 2020-06-03 DIAGNOSIS — E785 Hyperlipidemia, unspecified: Secondary | ICD-10-CM

## 2020-06-03 DIAGNOSIS — I10 Essential (primary) hypertension: Secondary | ICD-10-CM | POA: Diagnosis not present

## 2020-06-03 DIAGNOSIS — I2510 Atherosclerotic heart disease of native coronary artery without angina pectoris: Secondary | ICD-10-CM | POA: Diagnosis not present

## 2020-06-03 DIAGNOSIS — I480 Paroxysmal atrial fibrillation: Secondary | ICD-10-CM | POA: Diagnosis not present

## 2020-06-03 LAB — LIPID PANEL
Chol/HDL Ratio: 2.2 ratio (ref 0.0–4.4)
Cholesterol, Total: 165 mg/dL (ref 100–199)
HDL: 76 mg/dL (ref >39)
LDL Chol Calc (NIH): 71 mg/dL (ref 0–99)
Triglycerides: 102 mg/dL (ref 0–149)
VLDL Cholesterol Cal: 18 mg/dL (ref 5–40)

## 2020-06-03 NOTE — Patient Instructions (Signed)
Check your blood pressure daily for one week and send [place] a MyChart message with a list of your readings.   Medication Instructions:  Your physician recommends that you continue on your current medications as directed. Please refer to the Current Medication list given to you today.  *If you need a refill on your cardiac medications before your next appointment, please call your pharmacy*  Follow-Up: At [facility], you and your health needs are our priority.  As part of our continuing mission to provide you with exceptional heart care, we have created designated Provider Care Teams.  These Care Teams include your primary Cardiologist (physician) and Advanced Practice Providers (APPs -  Physician Assistants and Nurse Practitioners) who all work together to provide you with the care you need, when you need it.  Your next appointment:   6 month(s)  The format for your next appointment:   In Person  Provider:   You may see Fransico Him, MD or one of the following Advanced Practice Providers on your designated Care Team:    Melina Copa, PA-C  Ermalinda Barrios, PA-C

## 2020-06-03 NOTE — Progress Notes (Signed)
Cardiology Office Note:    Date:  06/03/2020   ID:  [name], DOB 1946-03-13, MRN [id number]  PCP:  Gaynelle Arabian, MD  Cardiologist:  Fransico Him, MD    Referring MD: Gaynelle Arabian, MD   Chief Complaint  Patient presents with  . Coronary Artery Disease  . Hypertension  . Hyperlipidemia  . Atrial Fibrillation    History of Present Illness:    [name] is a 75 y.o. female with a hx of CAD (s/p PCI of LAD/RCA 2007 with 30% mid LAD, 20% OM1, 70% distal left circ too small for PCI, 90% acute RV mariginal too small for PCI), HTN, PAF and dyslipidemia.  Since I saw her last she was admitted to [facility] with new onset atrial fibrillation in Oct 2021 and started on Eliquis and Amio.  She was seen back with EP in Jan 2022 and was doing well.    She is here today for followup and is doing well.  She denies any chest pain or pressure, SOB, DOE, PND, orthopnea, LE edema, dizziness, palpitations or syncope. She is compliant with her meds and is tolerating meds with no SE.    Past Medical History:  Diagnosis Date  . Arthritis   . Coronary artery disease    s/p PCI of LAD/RCA, cath 2007 with 30% mid LAD, 20% OM1, 70% distal left circ too small for PCI, 90% acute RV mariginal too small for PCI  . Depression   . Diastolic dysfunction   . GERD (gastroesophageal reflux disease)   . Headache    hx migraines 32 yrs ago  . Hyperlipemia   . Hypertension   . OSA (obstructive sleep apnea)   . PAF (paroxysmal atrial fibrillation) ([place])   . RLS (restless legs syndrome)     Past Surgical History:  Procedure Laterality Date  . both wrist surgery  1985  . CARDIAC CATHETERIZATION    . CHOLECYSTECTOMY    . COLONOSCOPY WITH PROPOFOL N/A 08/23/2016   Procedure: COLONOSCOPY WITH PROPOFOL;  Surgeon: Garlan Fair, MD;  Location: [facility];  Service: Endoscopy;  Laterality: N/A;  . CORONARY STENT PLACEMENT  2006   x2  . LUMBAR DISC SURGERY    . ROTATOR CUFF REPAIR Left     shoulder  . TENDON REPAIR Right    ankle  . TOTAL KNEE ARTHROPLASTY Left 12/21/2012   Procedure: LEFT TOTAL KNEE ARTHROPLASTY;  Surgeon: Augustin Schooling, MD;  Location: [place];  Service: Orthopedics;  Laterality: Left;    Current Medications: Current Meds  Medication Sig  . amiodarone (PACERONE) 200 MG tablet Take 1 tablet by mouth daily.  Marland Kitchen amLODipine (NORVASC) 5 MG tablet Take 1 tablet (5 mg total) by mouth daily.  Marland Kitchen apixaban (ELIQUIS) 5 MG TABS tablet Take 1 tablet (5 mg total) by mouth 2 (two) times daily.  . Calcium Carbonate-Vitamin D 600-400 MG-UNIT tablet Take 1 tablet by mouth daily.  . Carboxymethylcellul-Glycerin (CLEAR EYES FOR DRY EYES OP) Place 1 drop into both eyes 2 (two) times daily.  . Cholecalciferol (VITAMIN D) 2000 units CAPS Take 2,000 Units by mouth daily.  . Collagen Matrix Fenest, [place], 6X9CM SHEE Apply topically daily.  Marland Kitchen diltiazem (CARDIZEM) 30 MG tablet Take 1 tablet every 4 hours AS NEEDED for AFIB heart rate >100  . ezetimibe (ZETIA) 10 MG tablet Take 10 mg by mouth every evening.   . isosorbide mononitrate (IMDUR) 60 MG 24 hr tablet Take 60 mg by mouth daily.  Marland Kitchen  Magnesium 500 MG TABS Take 1 tablet by mouth daily in the afternoon.  . magnesium oxide (MAG-OX) 400 MG tablet Take 400 mg by mouth daily.  . Melatonin 10 MG TABS Take 1 tablet by mouth daily as needed (for sleep).  . metoprolol succinate (TOPROL XL) 25 MG 24 hr tablet Take 1 tablet (25 mg total) by mouth daily as needed (take one tablet by mouth daily for breakthrough palpitations).  . Omega-3 Fatty Acids (FISH OIL PO) Take 1 capsule by mouth daily.   . pantoprazole (PROTONIX) 40 MG tablet Take 40 mg by mouth daily.  . pravastatin (PRAVACHOL) 80 MG tablet Take 80 mg by mouth every evening.  Marland Kitchen rOPINIRole (REQUIP) 0.5 MG tablet Take 0.5-1 mg by mouth 2 (two) times daily. For restless leg syndrome 1 IN THE MORNING AND 2 AT NIGHT  . sertraline (ZOLOFT) 50 MG tablet Take 50 mg by mouth at bedtime.       Allergies:   Lipitor [atorvastatin], Other, Prednisone, and Limbitrol ds  [chlordiazepoxide-amitriptyline]   Social History   Socioeconomic History  . Marital status: Legally Separated    Spouse name: [name]  . Number of children: 1  . Years of education: GED  . Highest education level: Not on file  Occupational History  . Occupation: Retired    Fish farm manager: UNEMPLOYED  Tobacco Use  . Smoking status: Never Smoker  . Smokeless tobacco: Never Used  Vaping Use  . Vaping Use: Never used  Substance and Sexual Activity  . Alcohol use: No  . Drug use: No  . Sexual activity: Not on file  Other Topics Concern  . Not on file  Social History Narrative   Pt lives at home alone.   Caffeine: 2-3 cups of coffee daily   Her daughter manages her medications   Social Determinants of Health   Financial Resource Strain: Not on file  Food Insecurity: Not on file  Transportation Needs: Not on file  Physical Activity: Not on file  Stress: Not on file  Social Connections: Not on file     Family History: The patient's family history includes ALS in her sister; Dementia in her mother and sister; Heart Problems in her father and mother; Lymphoma in her sister and sister; Parkinson's disease in her sister.  ROS:   Please see the history of present illness.    ROS  All other systems reviewed and negative.   EKGs/Labs/Other Studies Reviewed:    The following studies were reviewed today: none  EKG:  EKG is not ordered today.    Recent Labs: 01/09/2020: Magnesium 2.1 02/02/2020: Hemoglobin 14.0; Platelets 215 04/08/2020: ALT 18; BUN 13; Creatinine, Ser 0.84; Potassium 3.7; Sodium 138; TSH 2.540   Recent Lipid Panel    Component Value Date/Time   CHOL 139 01/10/2020 0054   CHOL 159 12/13/2016 1233   TRIG 95 01/10/2020 0054   HDL 56 01/10/2020 0054   HDL 64 12/13/2016 1233   CHOLHDL 2.5 01/10/2020 0054   VLDL 19 01/10/2020 0054   LDLCALC 64 01/10/2020 0054   LDLCALC 65 12/13/2016  1233    Physical Exam:    VS:  BP (!) 148/88   Pulse 68   Ht 5\' 4"  (1.626 m)   Wt 214 lb (97.1 kg)   SpO2 96%   BMI 36.73 kg/m     Wt Readings from Last 3 Encounters:  06/03/20 214 lb (97.1 kg)  04/08/20 218 lb (98.9 kg)  02/17/20 215 lb 9.6 oz (97.8 kg)  GEN: Well nourished, well developed in no acute distress HEENT: Normal NECK: No JVD; No carotid bruits LYMPHATICS: No lymphadenopathy CARDIAC:RRR, no rubs, gallops.  2/6 SM at RUSB RESPIRATORY:  Clear to auscultation without rales, wheezing or rhonchi  ABDOMEN: Soft, non-tender, non-distended MUSCULOSKELETAL:  No edema; No deformity  SKIN: Warm and dry NEUROLOGIC:  Alert and oriented x 3 PSYCHIATRIC:  Normal affect    ASSESSMENT:    1. Coronary artery disease involving native coronary artery of native heart without angina pectoris   2. Primary hypertension   3. Pure hypercholesterolemia   4. Paroxysmal atrial fibrillation (HCC)    PLAN:    In order of problems listed above:  1.  ASCAD  -s/p PCI of LAD/RCA 2007 with 30% mid LAD, 20% OM1, 70% distal left circ too small for PCI, 90% acute RV mariginal too small for PCI.   -she has not had any anginal sx since I saw her last -continue on aspirin 81 mg daily, Imdur 60 mg daily, Plavix 75 mg daily and statin.  2.  HTN  -BP borderline controlled on exam today -continue amlodipine 7.5mg  daily -I have asked her to check her BP daily for a week and call with results  3.  Hyperlipidemia  -LDL goal is less than 70.   -LDL was 46 in Dec 2021 -continue on Zetia 10 mg daily and Crestor 20 mg daily.  4.  PAF -she is maintaining NSR on exam today -continue continue Amio 200mg  daily -continue Toprol XL 25mg  PRN and Cardizem short acting for breakthrough palpitations -continue Eliquis 5mg  BID>>she denies any bleeding problems  -SCr stable at 0.84 in Jan 2022 -Hbg stable at 14 in Nov 2021  Followup with me in 6 months   Medication Adjustments/Labs and Tests  Ordered: Current medicines are reviewed at length with the patient today.  Concerns regarding medicines are outlined above.  No orders of the defined types were placed in this encounter.  No orders of the defined types were placed in this encounter.   Signed, Fransico Him, MD  06/03/2020 12:08 PM    [facility]

## 2020-06-16 DIAGNOSIS — Z974 Presence of external hearing-aid: Secondary | ICD-10-CM | POA: Diagnosis not present

## 2020-06-16 DIAGNOSIS — H6123 Impacted cerumen, bilateral: Secondary | ICD-10-CM | POA: Diagnosis not present

## 2020-06-16 DIAGNOSIS — J342 Deviated nasal septum: Secondary | ICD-10-CM | POA: Diagnosis not present

## 2020-06-16 DIAGNOSIS — J3489 Other specified disorders of nose and nasal sinuses: Secondary | ICD-10-CM | POA: Diagnosis not present

## 2020-06-16 DIAGNOSIS — H61303 Acquired stenosis of external ear canal, unspecified, bilateral: Secondary | ICD-10-CM | POA: Diagnosis not present

## 2020-06-16 DIAGNOSIS — J310 Chronic rhinitis: Secondary | ICD-10-CM | POA: Diagnosis not present

## 2020-06-16 DIAGNOSIS — H9193 Unspecified hearing loss, bilateral: Secondary | ICD-10-CM | POA: Diagnosis not present

## 2020-06-21 DIAGNOSIS — L84 Corns and callosities: Secondary | ICD-10-CM | POA: Diagnosis not present

## 2020-06-25 ENCOUNTER — Encounter: Payer: Self-pay | Admitting: Podiatry

## 2020-06-25 ENCOUNTER — Other Ambulatory Visit: Payer: Self-pay

## 2020-06-25 ENCOUNTER — Ambulatory Visit (INDEPENDENT_AMBULATORY_CARE_PROVIDER_SITE_OTHER): Payer: Medicare Other | Admitting: Podiatry

## 2020-06-25 DIAGNOSIS — M21961 Unspecified acquired deformity of right lower leg: Secondary | ICD-10-CM

## 2020-06-25 DIAGNOSIS — K219 Gastro-esophageal reflux disease without esophagitis: Secondary | ICD-10-CM | POA: Insufficient documentation

## 2020-06-25 DIAGNOSIS — N3001 Acute cystitis with hematuria: Secondary | ICD-10-CM | POA: Diagnosis not present

## 2020-06-25 DIAGNOSIS — D689 Coagulation defect, unspecified: Secondary | ICD-10-CM | POA: Diagnosis not present

## 2020-06-25 DIAGNOSIS — L84 Corns and callosities: Secondary | ICD-10-CM | POA: Diagnosis not present

## 2020-06-25 DIAGNOSIS — I209 Angina pectoris, unspecified: Secondary | ICD-10-CM | POA: Insufficient documentation

## 2020-06-25 DIAGNOSIS — R7303 Prediabetes: Secondary | ICD-10-CM | POA: Insufficient documentation

## 2020-06-25 DIAGNOSIS — L409 Psoriasis, unspecified: Secondary | ICD-10-CM | POA: Insufficient documentation

## 2020-06-25 DIAGNOSIS — F321 Major depressive disorder, single episode, moderate: Secondary | ICD-10-CM | POA: Insufficient documentation

## 2020-06-25 DIAGNOSIS — M25512 Pain in left shoulder: Secondary | ICD-10-CM | POA: Diagnosis not present

## 2020-06-25 DIAGNOSIS — F419 Anxiety disorder, unspecified: Secondary | ICD-10-CM | POA: Insufficient documentation

## 2020-06-25 DIAGNOSIS — E785 Hyperlipidemia, unspecified: Secondary | ICD-10-CM | POA: Insufficient documentation

## 2020-06-25 DIAGNOSIS — F39 Unspecified mood [affective] disorder: Secondary | ICD-10-CM | POA: Insufficient documentation

## 2020-06-25 DIAGNOSIS — D6859 Other primary thrombophilia: Secondary | ICD-10-CM | POA: Insufficient documentation

## 2020-06-25 NOTE — Progress Notes (Signed)
  Subjective:  Patient ID: [name], female    DOB: 21-May-1945,  MRN: [id number]  Chief Complaint  Patient presents with  . Callouses    I have a spot on the ball of the right foot   75 y.o. female presents with the above complaint. History confirmed with patient.  Objective:  Physical Exam: warm, good capillary refill, no trophic changes or ulcerative lesions, normal DP and PT pulses and normal sensory exam.  Right Foot: HPK submet 1  Assessment:   1. Metatarsal deformity, right   2. Callus   3. Coagulation defect [facility])    Plan:  Patient was evaluated and treated and all questions answered.  Callus, Coag Defect -Educated on etiology -Debrided to patient tolerance  Procedure: Paring of Lesion Rationale: painful hyperkeratotic lesion Type of Debridement: manual, sharp debridement. Instrumentation: 312 blade Number of Lesions: 1   Return if symptoms worsen or fail to improve.

## 2020-07-03 ENCOUNTER — Telehealth: Payer: Self-pay | Admitting: Cardiology

## 2020-07-03 MED ORDER — AMLODIPINE BESYLATE 5 MG PO TABS: 7.5000 mg | ORAL_TABLET | Freq: Every day | ORAL | 3 refills | Status: DC

## 2020-07-03 NOTE — Telephone Encounter (Signed)
Daughter of the patient was returning Ann's call

## 2020-07-03 NOTE — Telephone Encounter (Signed)
Pt c/o BP issue: STAT if pt c/o blurred vision, one-sided weakness or slurred speech  1. What are your last 5 BP readings?  167/83 184/93 178/81 166/76  2. Are you having any other symptoms (ex. Dizziness, headache, blurred vision, passed out)? No  3. What is your BP issue? Daughter states BP has gone high and the patient's BP medication needs to be raised  Daughter apologizes but she was not able to take the patient's BP to report to Dr. Radford Pax sooner. The patient is the only person caring for her and it is hard to keep track.

## 2020-07-03 NOTE — Telephone Encounter (Signed)
LMTCB  .  HTN (Last OV 06/03/20)  -BP borderline controlled on exam today -continue amlodipine 7.5mg  daily -I have asked her to check her BP daily for a week and call with results  Next OV 11/2020

## 2020-07-03 NOTE — Telephone Encounter (Signed)
Called patient daughter in regards to BP readings.  Per Dr. Radford Pax OV note 06/03/20 patient will continue amlodipine 7.5 mg daily.  Patient is currently taking 5 mg daily.  I informed her of Dr. Theodosia Blender note and that she can increased amlodipine to 7.5 mg take 1.5 tablets.  I also encouraged her to monitor BP readings for a week and call in results to see if increased dose is helpful.  She verbalizes understanding all questions answered.   She expressed that patient has has some falls recently and she forgot to send BP readings to Dr. Radford Pax.  She wants Dr. Radford Pax to know she has had a lot going on with patient that's why she is late sending in BP readings.

## 2020-07-06 DIAGNOSIS — S46012A Strain of muscle(s) and tendon(s) of the rotator cuff of left shoulder, initial encounter: Secondary | ICD-10-CM | POA: Diagnosis not present

## 2020-07-06 DIAGNOSIS — M79602 Pain in left arm: Secondary | ICD-10-CM | POA: Insufficient documentation

## 2020-07-10 DIAGNOSIS — M25561 Pain in right knee: Secondary | ICD-10-CM | POA: Diagnosis not present

## 2020-07-10 DIAGNOSIS — M25512 Pain in left shoulder: Secondary | ICD-10-CM | POA: Diagnosis not present

## 2020-07-13 NOTE — Telephone Encounter (Signed)
Spoke with the patient's daughter who states that the patient's blood pressure has remained elevated at night despite increasing amlodipine to 7.5 mg daily. She takes amlodipine every morning. Blood pressure at night has been averaging 160-170s/70s. She states that it got up to 190/100 one time. The daughter states that during the day her blood pressure has been good. She does not have the list of her readings with her but is going to send a MyChart message with a list when she gets home.  The daughter also states that the patient has been more fatigued recently and seems to be sleeping a lot more. The patient has dementia which the daughter states has gotten worse. Denies any other symptoms.

## 2020-07-13 NOTE — Telephone Encounter (Signed)
Patient's daughter states the patient's BP is still getting high at times. She states sometimes it's okay, but sometimes at night it will be high while he is laying in bed. She states she does not have any BP readings with her and that they are at the patient's house. She states it has been ranging 160-190 on the top. She states the bottom number is usually okay, but has gotten up to 100. She states the patient has also been getting worn out more easily. She states the patient says she not getting much sleep and just wants to lay around a lot, but she states she has caught the patient sleeping. She states she is not sure if her tiredness is due to her dementia, BP, or her heart.

## 2020-07-14 MED ORDER — AMLODIPINE BESYLATE 5 MG PO TABS: 5.0000 mg | ORAL_TABLET | Freq: Two times a day (BID) | ORAL | 3 refills | Status: DC

## 2020-07-14 NOTE — Telephone Encounter (Signed)
Kristi Margarita, MD  You 4 hours ago (9:35 AM)   Increase amlodipine and take 5mg  BID and check BP twice daily for a week and call with results    Spoke with the patient's daughter and advised her that Dr. Radford Pax would like for her to increase amlodipine to 5 mg twice daily. She will take the patient's BP twice daily for a week and send [place] a message with a list of the readings.

## 2020-08-08 DIAGNOSIS — M25532 Pain in left wrist: Secondary | ICD-10-CM | POA: Diagnosis not present

## 2020-08-08 DIAGNOSIS — M533 Sacrococcygeal disorders, not elsewhere classified: Secondary | ICD-10-CM | POA: Diagnosis not present

## 2020-08-18 DIAGNOSIS — M533 Sacrococcygeal disorders, not elsewhere classified: Secondary | ICD-10-CM | POA: Diagnosis not present

## 2020-08-18 DIAGNOSIS — M25532 Pain in left wrist: Secondary | ICD-10-CM | POA: Diagnosis not present

## 2020-08-31 ENCOUNTER — Other Ambulatory Visit: Payer: Self-pay

## 2020-08-31 ENCOUNTER — Ambulatory Visit (INDEPENDENT_AMBULATORY_CARE_PROVIDER_SITE_OTHER): Payer: Medicare Other | Admitting: Podiatry

## 2020-08-31 DIAGNOSIS — L84 Corns and callosities: Secondary | ICD-10-CM | POA: Diagnosis not present

## 2020-08-31 DIAGNOSIS — D689 Coagulation defect, unspecified: Secondary | ICD-10-CM | POA: Diagnosis not present

## 2020-08-31 NOTE — Progress Notes (Signed)
  Subjective:  Patient ID: [name], female    DOB: [birth year],  MRN: [id number]  No chief complaint on file.  75 y.o. female presents for f/u. States the lesion is painful again.  Objective:  Physical Exam: warm, good capillary refill, no trophic changes or ulcerative lesions, normal DP and PT pulses and normal sensory exam.  Right Foot: HPK submet 1  Assessment:   1. Callus   2. Coagulation defect [facility])    Plan:  Patient was evaluated and treated and all questions answered.  Callus, Coag Defect -Debrided again to tolerance -Dispensed offloading foam pad.   No follow-ups on file.

## 2020-09-10 DIAGNOSIS — Z23 Encounter for immunization: Secondary | ICD-10-CM | POA: Diagnosis not present

## 2020-09-10 DIAGNOSIS — S80812A Abrasion, left lower leg, initial encounter: Secondary | ICD-10-CM | POA: Diagnosis not present

## 2020-09-10 DIAGNOSIS — M79605 Pain in left leg: Secondary | ICD-10-CM | POA: Diagnosis not present

## 2020-09-10 DIAGNOSIS — S8010XA Contusion of unspecified lower leg, initial encounter: Secondary | ICD-10-CM | POA: Diagnosis not present

## 2020-09-14 DIAGNOSIS — M79605 Pain in left leg: Secondary | ICD-10-CM | POA: Diagnosis not present

## 2020-09-16 DIAGNOSIS — M79605 Pain in left leg: Secondary | ICD-10-CM | POA: Diagnosis not present

## 2020-09-21 DIAGNOSIS — M79605 Pain in left leg: Secondary | ICD-10-CM | POA: Diagnosis not present

## 2020-10-02 ENCOUNTER — Telehealth: Payer: Self-pay | Admitting: Cardiology

## 2020-10-02 DIAGNOSIS — M79605 Pain in left leg: Secondary | ICD-10-CM | POA: Diagnosis not present

## 2020-10-02 DIAGNOSIS — M7989 Other specified soft tissue disorders: Secondary | ICD-10-CM

## 2020-10-02 NOTE — Telephone Encounter (Signed)
    Pt c/o swelling: STAT is pt has developed SOB within 24 hours  If swelling, where is the swelling located? Left leg, right leg minor swelling  How much weight have you gained and in what time span? A month  Have you gained 3 pounds in a day or 5 pounds in a week?   Do you have a log of your daily weights (if so, list)?   Are you currently taking a fluid pill? Yes  Are you currently SOB? Yes  Have you traveled recently? No  Pt's daughter calling, she said pt fell on her bed last month and she been seeing ortho to help heal her left leg, everything was healed except a tiny scabbed on her left leg, today they went to ortho, they are concerned now because her left leg is hard as a brick and still swelling and they are afraid pt developed a clot, they were not concerned before because pt is on eliquis but not since swelling is getting worst the ortho advised to call us to schedule appt with Dr. Radford Pax first days next week.

## 2020-10-02 NOTE — Telephone Encounter (Signed)
Spoke with the patient's daughter who reports that the patient rolled off of her bed about a month ago and injured her shin. She states that she was been seen an orthopedist. There were no breaks but she has had a knee replacement in the same leg so she was started on antibiotics and followed closely over the past several weeks by ortho. She states that the patient only has a scar left now and was cleared by ortho today. However she has had continued swelling in her leg ever since the injury. Swelling has worsened and leg is tight. When she saw ortho today she was advised to call us because they were concerned that the patient may have a blood clot in her leg. Patient is currently on Eliquis 5mg  BID and has not missed any doses. Patient's daughter also reports that right leg has some minor swelling that started yesterday. Patient denies any numbness/tingling, pain, or change in temperature/color of her leg. She does report some minor SOB. Also reports some fatigue and weakness that has been present for several months now.  I spoke with DOD, Dr. Altamese Dilling who recommended a LE ultrasound to rule out a clot.  Patient's daughter advised on ER precautions for any worsening symptoms or increased SOB.

## 2020-10-05 ENCOUNTER — Encounter (HOSPITAL_COMMUNITY): Payer: Medicare Other

## 2020-10-06 ENCOUNTER — Ambulatory Visit (HOSPITAL_COMMUNITY)
Admission: RE | Admit: 2020-10-06 | Discharge: 2020-10-06 | Disposition: A | Discharge status: Home / Self Care | Payer: Medicare Other | Source: Ambulatory Visit | Attending: Cardiovascular Disease | Admitting: Cardiovascular Disease

## 2020-10-06 ENCOUNTER — Other Ambulatory Visit: Payer: Self-pay

## 2020-10-06 DIAGNOSIS — M7989 Other specified soft tissue disorders: Secondary | ICD-10-CM | POA: Diagnosis not present

## 2020-10-07 ENCOUNTER — Telehealth: Payer: Self-pay

## 2020-10-07 DIAGNOSIS — M7989 Other specified soft tissue disorders: Secondary | ICD-10-CM

## 2020-10-07 NOTE — Telephone Encounter (Signed)
Spoke with the patient's daughter and she will bring her by tomorrow for lab work.

## 2020-10-07 NOTE — Telephone Encounter (Signed)
Patient's daughter called office to let us know that the patient had the ultrasound of her legs done yesterday. She states they were told that it did not show any clots.  She is still concerned because right leg is also swollen now. (Previously only left leg which was injured was swollen).  She states that the patient has been complaining of shortness of breath. She is unsure whether it is at rest or with exertion. She does note that the patient becomes fatigued very easily. Advised on importance of elevating legs and avoiding sodium.

## 2020-10-08 ENCOUNTER — Other Ambulatory Visit: Payer: Medicare Other

## 2020-10-08 ENCOUNTER — Other Ambulatory Visit: Payer: Self-pay

## 2020-10-08 DIAGNOSIS — D689 Coagulation defect, unspecified: Secondary | ICD-10-CM | POA: Diagnosis not present

## 2020-10-08 DIAGNOSIS — I5033 Acute on chronic diastolic (congestive) heart failure: Secondary | ICD-10-CM | POA: Diagnosis not present

## 2020-10-08 DIAGNOSIS — M7989 Other specified soft tissue disorders: Secondary | ICD-10-CM | POA: Diagnosis not present

## 2020-10-08 DIAGNOSIS — I480 Paroxysmal atrial fibrillation: Secondary | ICD-10-CM | POA: Diagnosis not present

## 2020-10-08 LAB — D-DIMER, QUANTITATIVE: D-DIMER: 0.96 mg/L FEU — ABNORMAL HIGH (ref 0.00–0.49)

## 2020-10-08 LAB — PRO B NATRIURETIC PEPTIDE: NT-Pro BNP: 147 pg/mL (ref 0–738)

## 2020-10-09 ENCOUNTER — Telehealth: Payer: Self-pay | Admitting: Cardiology

## 2020-10-09 NOTE — Telephone Encounter (Signed)
[name] is calling requesting to speak with [name] to discuss Fia's lab results that she had performed yesterday. Please advise.

## 2020-10-09 NOTE — Telephone Encounter (Signed)
Pt's daughter aware labs from yesterday have not been reviewed at this time ./cy

## 2020-10-09 NOTE — Telephone Encounter (Signed)
See Lab results  Pt's daughter aware to call PCP re elevate d dimer per Dr Quentin Ore  .Adonis Housekeeper

## 2020-10-13 ENCOUNTER — Other Ambulatory Visit: Payer: Self-pay | Admitting: Family Medicine

## 2020-10-13 ENCOUNTER — Ambulatory Visit
Admission: RE | Admit: 2020-10-13 | Discharge: 2020-10-13 | Disposition: A | Discharge status: Home / Self Care | Payer: Medicare Other | Source: Ambulatory Visit | Attending: Family Medicine | Admitting: Family Medicine

## 2020-10-13 DIAGNOSIS — R0600 Dyspnea, unspecified: Secondary | ICD-10-CM

## 2020-10-13 DIAGNOSIS — R5383 Other fatigue: Secondary | ICD-10-CM | POA: Diagnosis not present

## 2020-10-13 DIAGNOSIS — R609 Edema, unspecified: Secondary | ICD-10-CM | POA: Diagnosis not present

## 2020-10-13 DIAGNOSIS — R0602 Shortness of breath: Secondary | ICD-10-CM | POA: Diagnosis not present

## 2020-10-13 DIAGNOSIS — I10 Essential (primary) hypertension: Secondary | ICD-10-CM | POA: Diagnosis not present

## 2020-10-16 ENCOUNTER — Other Ambulatory Visit: Payer: Self-pay | Admitting: Family Medicine

## 2020-10-16 DIAGNOSIS — R748 Abnormal levels of other serum enzymes: Secondary | ICD-10-CM

## 2020-10-22 ENCOUNTER — Ambulatory Visit
Admission: RE | Admit: 2020-10-22 | Discharge: 2020-10-22 | Disposition: A | Discharge status: Home / Self Care | Payer: Medicare Other | Source: Ambulatory Visit | Attending: Family Medicine | Admitting: Family Medicine

## 2020-10-22 DIAGNOSIS — K760 Fatty (change of) liver, not elsewhere classified: Secondary | ICD-10-CM | POA: Diagnosis not present

## 2020-10-22 DIAGNOSIS — R748 Abnormal levels of other serum enzymes: Secondary | ICD-10-CM

## 2020-10-22 DIAGNOSIS — R945 Abnormal results of liver function studies: Secondary | ICD-10-CM | POA: Diagnosis not present

## 2020-10-26 DIAGNOSIS — L219 Seborrheic dermatitis, unspecified: Secondary | ICD-10-CM | POA: Diagnosis not present

## 2020-11-05 ENCOUNTER — Other Ambulatory Visit: Payer: Self-pay | Admitting: Cardiology

## 2020-11-05 NOTE — Telephone Encounter (Signed)
Prescription refill request for Eliquis received. Indication: Afib Last office visit: 06/03/20 [place] Pax) Scr: 0.84 (04/08/20) Age: 75 Weight: 97.1kg   Appropriate dose and refill sent to requested pharmacy.

## 2020-12-01 ENCOUNTER — Ambulatory Visit (INDEPENDENT_AMBULATORY_CARE_PROVIDER_SITE_OTHER): Payer: Medicare Other | Admitting: Cardiology

## 2020-12-01 ENCOUNTER — Other Ambulatory Visit: Payer: Self-pay

## 2020-12-01 ENCOUNTER — Encounter: Payer: Self-pay | Admitting: Cardiology

## 2020-12-01 VITALS — BP 142/74 | HR 57 | Ht 64.0 in | Wt 218.8 lb

## 2020-12-01 DIAGNOSIS — I10 Essential (primary) hypertension: Secondary | ICD-10-CM

## 2020-12-01 DIAGNOSIS — M7989 Other specified soft tissue disorders: Secondary | ICD-10-CM

## 2020-12-01 DIAGNOSIS — R0602 Shortness of breath: Secondary | ICD-10-CM

## 2020-12-01 DIAGNOSIS — E7800 Pure hypercholesterolemia, unspecified: Secondary | ICD-10-CM

## 2020-12-01 DIAGNOSIS — I2510 Atherosclerotic heart disease of native coronary artery without angina pectoris: Secondary | ICD-10-CM | POA: Diagnosis not present

## 2020-12-01 DIAGNOSIS — I480 Paroxysmal atrial fibrillation: Secondary | ICD-10-CM

## 2020-12-01 MED ORDER — LOSARTAN POTASSIUM 25 MG PO TABS: 25.0000 mg | ORAL_TABLET | Freq: Every day | ORAL | 3 refills | Status: DC

## 2020-12-01 MED ORDER — FUROSEMIDE 20 MG PO TABS: 20.0000 mg | ORAL_TABLET | Freq: Every day | ORAL | 3 refills | Status: DC

## 2020-12-01 MED ORDER — AMLODIPINE BESYLATE 5 MG PO TABS: 5.0000 mg | ORAL_TABLET | Freq: Every day | ORAL | 3 refills | Status: DC

## 2020-12-01 NOTE — Patient Instructions (Signed)
PLEASE TAKE YOUR BLOOD PRESSURE DAILY FOR ONE WEEK AND CALL WITH A LIST OF YOUR READINGS  Medication Instructions:  Your physician has recommended you make the following change in your medication: 1) DECREASE amlodipine to 5 mg daily  2) START taking losartan 25 mg daily  3) START Lasix (furosemide) 20 gm daily  *If you need a refill on your cardiac medications before your next appointment, please call your pharmacy*   Lab Work: TODAY: BNP, BMET and TSH ONE WEEK: BMET  If you have labs (blood work) drawn today and your tests are completely normal, you will receive your results only by: [place] (if you have MyChart) OR A paper copy in the mail If you have any lab test that is abnormal or we need to change your treatment, we will call you to review the results.   Testing/Procedures: Your physician has requested that you have an echocardiogram. Echocardiography is a painless test that uses sound waves to create images of your heart. It provides your doctor with information about the size and shape of your heart and how well your heart's chambers and valves are working. This procedure takes approximately one hour. There are no restrictions for this procedure.  Your physician has recommended that you have a pulmonary function test. Pulmonary Function Tests are a group of tests that measure how well air moves in and out of your lungs.  Your physician has recommended that you have a VQ scan.    Follow-Up: At [facility], you and your health needs are our priority.  As part of our continuing mission to provide you with exceptional heart care, we have created designated Provider Care Teams.  These Care Teams include your primary Cardiologist (physician) and Advanced Practice Providers (APPs -  Physician Assistants and Nurse Practitioners) who all work together to provide you with the care you need, when you need it.   Your next appointment:   3 week(s)  The format for your next  appointment:   In Person  Provider:   You will see one of the following Advanced Practice Providers on your designated Care Team:   Melina Copa, PA-C Ermalinda Barrios, PA-C  Then, Fransico Him, MD will plan to see you again in 6 month(s).   Other Instructions:  Ventilation Perfusion Scan A ventilation-perfusion scan is a procedure to look at airflow and blood flow in the lungs. It is most often done to look for a blood clot that may have traveled to the lungs (pulmonary embolus). During this scan, radioactive compounds are injected into the bloodstream and are also breathed in. The compounds are not harmful. They are given at very low doses and stay in the body for a short time. After the compounds are injected and breathed in, a camera is used to scan the lungs. Tell a health care provider about: Any allergies you have. All medicines you are taking, including vitamins, herbs, eye drops, creams, and over-the-counter medicines. Any blood disorders you have. Any surgeries you have had. Any medical conditions you have. Whether you are pregnant or may be pregnant. Whether or not you are breastfeeding. What are the risks? Generally, this is a safe procedure. However, problems may occur, including an allergic reaction to the radioactive compounds. What happens before the procedure? Do not use any products that contain nicotine or tobacco before the procedure. These products include cigarettes, chewing tobacco, and vaping devices, such as e-cigarettes. If you need help quitting, ask your health care provider. Ask your  health care provider about changing or stopping your regular medicines. This is especially important if you are taking diabetes medicines or blood thinners. What happens during the procedure? An IV will be inserted into one of your veins. The IV will stay in place for the entire exam. A small amount of radioactive material will be injected into your bloodstream. Your lungs will be  scanned with a camera. This camera will record the images. You will be asked to inhale a second radioactive compound. Take deep breaths as directed by the health care provider. Your lungs will be scanned again. Your IV will be removed. The procedure may vary among health care providers and hospitals. What can I expect after the procedure? You may go home unless your health care provider instructs you differently. You may continue with your normal activities and diet as instructed by your health care provider. It is up to you to get the results of your procedure. Ask your health care provider, or the department that is doing the procedure, when your results will be ready. Summary A ventilation-perfusion scan is a procedure to look at airflow and blood flow in the lungs. It is most often done to look for blood clots that may have traveled to the lungs. The radioactive compounds used during the procedure are not harmful. Your lungs will be scanned with a camera. This camera will record the images. It is up to you to get the results of your procedure. Ask your health care provider, or the department that is doing the procedure, when your results will be ready. This information is not intended to replace advice given to you by your health care provider. Make sure you discuss any questions you have with your health care provider. Document Revised: 02/14/2020 Document Reviewed: 02/14/2020 Elsevier Patient Education  2022 [employer].

## 2020-12-01 NOTE — Addendum Note (Signed)
Addended by: Antonieta Iba on: 12/01/2020 11:49 AM   Modules accepted: Orders

## 2020-12-01 NOTE — Progress Notes (Signed)
Cardiology Office Note:    Date:  12/01/2020   ID:  [name], DOB May 04, 1945, MRN [id number]  PCP:  Kristi Arabian, MD  Cardiologist:  Kristi Him, MD    Referring MD: Kristi Arabian, MD   Chief Complaint  Patient presents with   Coronary Artery Disease   Hyperlipidemia   Hypertension   Atrial Fibrillation    History of Present Illness:    [name] is a 75 y.o. female with a hx of CAD (s/p PCI of LAD/RCA 2007 with 30% mid LAD, 20% OM1, 70% distal left circ too small for PCI, 90% acute RV mariginal too small for PCI), HTN, PAF and dyslipidemia.    She is here today for followup.  Over the past few months she has started having LE edema.  BNP was normal in July.  LE venous dopplers showed no DVT but does have bilateral Baker's cysts. She thinks that it might be the amlodipine since her sx started after going on the amlodipine.  She tries to eat a low Na diet but dose eat out some.  She also has had problems with SOB which  is new over the past year.  She refused to use her CPAP.  She denies any chest pain or pressure, PND, orthopnea, dizziness, palpitations or syncope. She is compliant with her meds and is tolerating meds with no SE.     Past Medical History:  Diagnosis Date   Arthritis    Coronary artery disease    s/p PCI of LAD/RCA, cath 2007 with 30% mid LAD, 20% OM1, 70% distal left circ too small for PCI, 90% acute RV mariginal too small for PCI   Depression    Diastolic dysfunction    GERD (gastroesophageal reflux disease)    Headache    hx migraines 32 yrs ago   Hyperlipemia    Hypertension    OSA (obstructive sleep apnea)    PAF (paroxysmal atrial fibrillation) (HCC)    RLS (restless legs syndrome)     Past Surgical History:  Procedure Laterality Date   both wrist surgery  1985   CARDIAC CATHETERIZATION     CHOLECYSTECTOMY     COLONOSCOPY WITH PROPOFOL N/A 08/23/2016   Procedure: COLONOSCOPY WITH PROPOFOL;  Surgeon: Kristi Fair, MD;   Location: [facility];  Service: Endoscopy;  Laterality: N/A;   CORONARY STENT PLACEMENT  2006   x2   LUMBAR DISC SURGERY     ROTATOR CUFF REPAIR Left    shoulder   TENDON REPAIR Right    ankle   TOTAL KNEE ARTHROPLASTY Left 12/21/2012   Procedure: LEFT TOTAL KNEE ARTHROPLASTY;  Surgeon: Kristi Schooling, MD;  Location: [place];  Service: Orthopedics;  Laterality: Left;    Current Medications: Current Meds  Medication Sig   amiodarone (PACERONE) 200 MG tablet Take 1 tablet by mouth daily.   amLODipine (NORVASC) 5 MG tablet Take 1 tablet (5 mg total) by mouth in the morning and at bedtime.   Calcium Carbonate-Vitamin D 600-400 MG-UNIT tablet Take 1 tablet by mouth daily.   Carboxymethylcellul-Glycerin (CLEAR EYES FOR DRY EYES OP) Place 1 drop into both eyes 2 (two) times daily.   Cholecalciferol (VITAMIN D) 2000 units CAPS Take 2,000 Units by mouth daily.   Collagen Matrix Fenest, [place], 6X9CM SHEE Apply topically daily.   diltiazem (CARDIZEM) 30 MG tablet Take 1 tablet every 4 hours AS NEEDED for AFIB heart rate >100   ELIQUIS 5 MG TABS tablet TAKE 1  TABLET TWICE A DAY   ezetimibe (ZETIA) 10 MG tablet Take 10 mg by mouth every evening.    isosorbide mononitrate (IMDUR) 60 MG 24 hr tablet Take 60 mg by mouth daily.   Magnesium 500 MG TABS Take 1 tablet by mouth daily in the afternoon.   Omega-3 Fatty Acids (FISH OIL PO) Take 1 capsule by mouth daily.    pantoprazole (PROTONIX) 40 MG tablet Take 40 mg by mouth daily.   pravastatin (PRAVACHOL) 80 MG tablet Take 80 mg by mouth every evening.   rOPINIRole (REQUIP) 0.5 MG tablet Take 0.5-1 mg by mouth 2 (two) times daily. For restless leg syndrome 1 IN THE MORNING AND 2 AT NIGHT   sertraline (ZOLOFT) 50 MG tablet Take 50 mg by mouth at bedtime.      Allergies:   Lipitor [atorvastatin], Other, Prednisone, and Limbitrol ds  [chlordiazepoxide-amitriptyline]   Social History   Socioeconomic History   Marital status: Legally Separated     Spouse name: [name]   Number of children: 1   Years of education: GED   Highest education level: Not on file  Occupational History   Occupation: Retired    Fish farm manager: UNEMPLOYED  Tobacco Use   Smoking status: Never   Smokeless tobacco: Never  Vaping Use   Vaping Use: Never used  Substance and Sexual Activity   Alcohol use: No   Drug use: No   Sexual activity: Not on file  Other Topics Concern   Not on file  Social History Narrative   Pt lives at home alone.   Caffeine: 2-3 cups of coffee daily   Her daughter manages her medications   Social Determinants of Health   Financial Resource Strain: Not on file  Food Insecurity: Not on file  Transportation Needs: Not on file  Physical Activity: Not on file  Stress: Not on file  Social Connections: Not on file     Family History: The patient's family history includes ALS in her sister; Dementia in her mother and sister; Heart Problems in her father and mother; Lymphoma in her sister and sister; Parkinson's disease in her sister.  ROS:   Please see the history of present illness.    ROS  All other systems reviewed and negative.   EKGs/Labs/Other Studies Reviewed:    The following studies were reviewed today: none  EKG:  EKG is not ordered today.    Recent Labs: 01/09/2020: Magnesium 2.1 02/02/2020: Hemoglobin 14.0; Platelets 215 04/08/2020: ALT 18; BUN 13; Creatinine, Ser 0.84; Potassium 3.7; Sodium 138; TSH 2.540 10/08/2020: NT-Pro BNP 147   Recent Lipid Panel    Component Value Date/Time   CHOL 165 06/03/2020 1009   TRIG 102 06/03/2020 1009   HDL 76 06/03/2020 1009   CHOLHDL 2.2 06/03/2020 1009   CHOLHDL 2.5 01/10/2020 0054   VLDL 19 01/10/2020 0054   LDLCALC 71 06/03/2020 1009    Physical Exam:    VS:  BP (!) 142/74   Pulse (!) 57   Ht '5\' 4"'$  (1.626 m)   Wt 218 lb 12.8 oz (99.2 kg)   SpO2 97%   BMI 37.56 kg/m     Wt Readings from Last 3 Encounters:  12/01/20 218 lb 12.8 oz (99.2 kg)  06/03/20 214 lb  (97.1 kg)  04/08/20 218 lb (98.9 kg)     GEN: Well nourished, well developed in no acute distress HEENT: Normal NECK: No JVD; No carotid bruits LYMPHATICS: No lymphadenopathy CARDIAC:RRR, no rubs, gallops.  2/6 SM at  RUSB to LLSB RESPIRATORY:  Clear to auscultation without rales, wheezing or rhonchi  ABDOMEN: Soft, non-tender, non-distended MUSCULOSKELETAL:  2+ LE edema; No deformity  SKIN: Warm and dry NEUROLOGIC:  Alert and oriented x 3 PSYCHIATRIC:  Normal affect    ASSESSMENT:    1. Coronary artery disease involving native coronary artery of native heart without angina pectoris   2. Primary hypertension   3. Pure hypercholesterolemia   4. Paroxysmal atrial fibrillation (HCC)   5. SOB (shortness of breath)   6. Leg swelling     PLAN:    In order of problems listed above:  1.  ASCAD  -s/p PCI of LAD/RCA 2007 with 30% mid LAD, 20% OM1, 70% distal left circ too small for PCI, 90% acute RV mariginal too small for PCI.   -she has not had any anginal problems since I saw her last -Continue prescription drug management with ASA '81mg'$  daily, Plavix '75mg'$  daily, Imdur '60mg'$  daily and statin>refilled   2.  HTN  -BP is adequately controlled on exam today -Continue prescription drug management but decrease Amlodipine to '5mg'$  daily due to LE edema and add Losartan '25mg'$  daily -Check BP daily for a week and call with results -BMET in 1 week  3.  Hyperlipidemia  -LDL goal is less than 70.   -I have personally reviewed and interpreted outside labs performed by patient's PCP which showed LDL 71, HDL 76, TAG 102, ALT 23 in March 2002  -Continue prescription drug management with Zetia '10mg'$  daily and Crestor '20mg'$  daily>refilled   4.  PAF -she continues to maintain NSR on exam -Continue prescription drug management with Amio '200mg'$  daily and Eliquis '5mg'$  BID>refilled -continue PRN Cardizem and Toprol for breakthrough -I have personally reviewed and interpreted outside labs performed by  patient's PCP which showed SCr 0.73, K+ 4.4, Hbg 11.9, ALT 23 in July 2022 -check TSH -check PFTs  5.  SOB -this is new in the past year along with LE edema -I will get a 2D echo to assess LVF -LE venous dopplers showed no DVT but did show bilateral Baker's cysts -ddimer was mildly elevated and PCP did not do a Chest CTA -I will check a VQ scan to rule out chronic thromboembolic disease -check TSH, CBC, BNP, BMET  6.  LE edema -this is new recently -? Related to amlodipine -decrease Amlodipine to '5mg'$  daily -add Lasix '20mg'$  daily -BMET in 1 week  Followup with PA in 2 weeks and with me in 6 months   Medication Adjustments/Labs and Tests Ordered: Current medicines are reviewed at length with the patient today.  Concerns regarding medicines are outlined above.  No orders of the defined types were placed in this encounter.  No orders of the defined types were placed in this encounter.   Signed, Kristi Him, MD  12/01/2020 11:36 AM    [place]

## 2020-12-02 ENCOUNTER — Other Ambulatory Visit: Payer: Self-pay

## 2020-12-02 LAB — BASIC METABOLIC PANEL
BUN/Creatinine Ratio: 19 (ref 12–28)
BUN: 13 mg/dL (ref 8–27)
CO2: 21 mmol/L (ref 20–29)
Calcium: 9.8 mg/dL (ref 8.7–10.3)
Chloride: 100 mmol/L (ref 96–106)
Creatinine, Ser: 0.67 mg/dL (ref 0.57–1.00)
Glucose: 83 mg/dL (ref 65–99)
Potassium: 4.3 mmol/L (ref 3.5–5.2)
Sodium: 136 mmol/L (ref 134–144)
eGFR: 91 mL/min/{1.73_m2} (ref >59)

## 2020-12-02 LAB — TSH: TSH: 2.43 u[IU]/mL (ref 0.450–4.500)

## 2020-12-02 LAB — PRO B NATRIURETIC PEPTIDE: NT-Pro BNP: 288 pg/mL (ref 0–738)

## 2020-12-02 MED ORDER — FUROSEMIDE 20 MG PO TABS
20.0000 mg | ORAL_TABLET | Freq: Every day | ORAL | 0 refills | Status: DC
Start: 2020-12-02 — End: 2021-05-26

## 2020-12-02 MED ORDER — AMLODIPINE BESYLATE 5 MG PO TABS
5.0000 mg | ORAL_TABLET | Freq: Every day | ORAL | 0 refills | Status: DC
Start: 2020-12-02 — End: 2020-12-23

## 2020-12-02 MED ORDER — LOSARTAN POTASSIUM 25 MG PO TABS
25.0000 mg | ORAL_TABLET | Freq: Every day | ORAL | 0 refills | Status: DC
Start: 2020-12-02 — End: 2020-12-23

## 2020-12-02 NOTE — Telephone Encounter (Signed)
Pt's daughter called requesting a short supply of medications that was ordered at [place]. I sent in the medications to pt's local pharmacy for a 15 day supply until mail order arrives. Confirmation received.

## 2020-12-04 ENCOUNTER — Other Ambulatory Visit: Payer: Self-pay | Admitting: Cardiology

## 2020-12-05 LAB — SARS CORONAVIRUS 2 (TAT 6-24 HRS): SARS Coronavirus 2: NEGATIVE

## 2020-12-08 ENCOUNTER — Ambulatory Visit (HOSPITAL_COMMUNITY)
Admission: RE | Admit: 2020-12-08 | Discharge: 2020-12-08 | Disposition: A | Discharge status: Home / Self Care | Payer: Medicare Other | Source: Ambulatory Visit | Attending: Cardiology | Admitting: Cardiology

## 2020-12-08 ENCOUNTER — Ambulatory Visit (HOSPITAL_COMMUNITY): Payer: Medicare Other

## 2020-12-08 ENCOUNTER — Other Ambulatory Visit: Payer: Self-pay

## 2020-12-08 DIAGNOSIS — R0602 Shortness of breath: Secondary | ICD-10-CM | POA: Insufficient documentation

## 2020-12-08 DIAGNOSIS — I2510 Atherosclerotic heart disease of native coronary artery without angina pectoris: Secondary | ICD-10-CM | POA: Diagnosis not present

## 2020-12-08 DIAGNOSIS — I480 Paroxysmal atrial fibrillation: Secondary | ICD-10-CM | POA: Insufficient documentation

## 2020-12-08 DIAGNOSIS — I10 Essential (primary) hypertension: Secondary | ICD-10-CM | POA: Diagnosis not present

## 2020-12-08 DIAGNOSIS — E7800 Pure hypercholesterolemia, unspecified: Secondary | ICD-10-CM | POA: Diagnosis not present

## 2020-12-08 DIAGNOSIS — M7989 Other specified soft tissue disorders: Secondary | ICD-10-CM | POA: Insufficient documentation

## 2020-12-08 LAB — PULMONARY FUNCTION TEST
DL/VA % pred: 102 %
DL/VA: 4.21 ml/min/mmHg/L
DLCO unc % pred: 85 %
DLCO unc: 16.32 ml/min/mmHg
FEF 25-75 Post: 2.68 L/sec
FEF 25-75 Pre: 2.44 L/sec
FEF2575-%Change-Post: 9 %
FEF2575-%Pred-Post: 161 %
FEF2575-%Pred-Pre: 147 %
FEV1-%Change-Post: 0 %
FEV1-%Pred-Post: 93 %
FEV1-%Pred-Pre: 92 %
FEV1-Post: 1.97 L
FEV1-Pre: 1.96 L
FEV1FVC-%Change-Post: 0 %
FEV1FVC-%Pred-Pre: 112 %
FEV6-%Change-Post: 1 %
FEV6-%Pred-Post: 88 %
FEV6-%Pred-Pre: 87 %
FEV6-Post: 2.36 L
FEV6-Pre: 2.33 L
FEV6FVC-%Pred-Post: 105 %
FEV6FVC-%Pred-Pre: 105 %
FVC-%Change-Post: 1 %
FVC-%Pred-Post: 83 %
FVC-%Pred-Pre: 82 %
FVC-Post: 2.36 L
FVC-Pre: 2.33 L
Post FEV1/FVC ratio: 84 %
Post FEV6/FVC ratio: 100 %
Pre FEV1/FVC ratio: 84 %
Pre FEV6/FVC Ratio: 100 %

## 2020-12-08 MED ORDER — ALBUTEROL SULFATE (2.5 MG/3ML) 0.083% IN NEBU
2.5000 mg | INHALATION_SOLUTION | Freq: Once | RESPIRATORY_TRACT | Status: AC
  Administered 2020-12-08: 2.5 mg via RESPIRATORY_TRACT

## 2020-12-10 DIAGNOSIS — L820 Inflamed seborrheic keratosis: Secondary | ICD-10-CM | POA: Diagnosis not present

## 2020-12-10 DIAGNOSIS — L821 Other seborrheic keratosis: Secondary | ICD-10-CM | POA: Diagnosis not present

## 2020-12-10 DIAGNOSIS — L219 Seborrheic dermatitis, unspecified: Secondary | ICD-10-CM | POA: Diagnosis not present

## 2020-12-11 ENCOUNTER — Ambulatory Visit (HOSPITAL_COMMUNITY): Payer: Medicare Other

## 2020-12-15 ENCOUNTER — Other Ambulatory Visit: Payer: Self-pay

## 2020-12-15 ENCOUNTER — Emergency Department (HOSPITAL_COMMUNITY)
Admission: EM | Admit: 2020-12-15 | Discharge: 2020-12-15 | Disposition: A | Discharge status: Home / Self Care | Payer: Medicare Other | Attending: Emergency Medicine | Admitting: Emergency Medicine

## 2020-12-15 ENCOUNTER — Encounter (HOSPITAL_COMMUNITY): Payer: Self-pay | Admitting: Emergency Medicine

## 2020-12-15 ENCOUNTER — Emergency Department (HOSPITAL_COMMUNITY): Payer: Medicare Other

## 2020-12-15 ENCOUNTER — Ambulatory Visit (HOSPITAL_BASED_OUTPATIENT_CLINIC_OR_DEPARTMENT_OTHER): Payer: Medicare Other

## 2020-12-15 DIAGNOSIS — Z7901 Long term (current) use of anticoagulants: Secondary | ICD-10-CM | POA: Insufficient documentation

## 2020-12-15 DIAGNOSIS — R0602 Shortness of breath: Secondary | ICD-10-CM | POA: Insufficient documentation

## 2020-12-15 DIAGNOSIS — Y92511 Restaurant or cafe as the place of occurrence of the external cause: Secondary | ICD-10-CM | POA: Diagnosis not present

## 2020-12-15 DIAGNOSIS — I110 Hypertensive heart disease with heart failure: Secondary | ICD-10-CM | POA: Diagnosis not present

## 2020-12-15 DIAGNOSIS — S0990XA Unspecified injury of head, initial encounter: Secondary | ICD-10-CM | POA: Insufficient documentation

## 2020-12-15 DIAGNOSIS — Z96652 Presence of left artificial knee joint: Secondary | ICD-10-CM | POA: Diagnosis not present

## 2020-12-15 DIAGNOSIS — I480 Paroxysmal atrial fibrillation: Secondary | ICD-10-CM

## 2020-12-15 DIAGNOSIS — E7800 Pure hypercholesterolemia, unspecified: Secondary | ICD-10-CM | POA: Insufficient documentation

## 2020-12-15 DIAGNOSIS — I5033 Acute on chronic diastolic (congestive) heart failure: Secondary | ICD-10-CM | POA: Insufficient documentation

## 2020-12-15 DIAGNOSIS — W19XXXA Unspecified fall, initial encounter: Secondary | ICD-10-CM

## 2020-12-15 DIAGNOSIS — I10 Essential (primary) hypertension: Secondary | ICD-10-CM | POA: Insufficient documentation

## 2020-12-15 DIAGNOSIS — M7989 Other specified soft tissue disorders: Secondary | ICD-10-CM | POA: Insufficient documentation

## 2020-12-15 DIAGNOSIS — M50322 Other cervical disc degeneration at C5-C6 level: Secondary | ICD-10-CM | POA: Insufficient documentation

## 2020-12-15 DIAGNOSIS — M50323 Other cervical disc degeneration at C6-C7 level: Secondary | ICD-10-CM | POA: Diagnosis not present

## 2020-12-15 DIAGNOSIS — G319 Degenerative disease of nervous system, unspecified: Secondary | ICD-10-CM | POA: Diagnosis not present

## 2020-12-15 DIAGNOSIS — W1812XA Fall from or off toilet with subsequent striking against object, initial encounter: Secondary | ICD-10-CM | POA: Diagnosis not present

## 2020-12-15 DIAGNOSIS — I2510 Atherosclerotic heart disease of native coronary artery without angina pectoris: Secondary | ICD-10-CM | POA: Insufficient documentation

## 2020-12-15 DIAGNOSIS — F0391 Unspecified dementia with behavioral disturbance: Secondary | ICD-10-CM | POA: Insufficient documentation

## 2020-12-15 DIAGNOSIS — Z043 Encounter for examination and observation following other accident: Secondary | ICD-10-CM | POA: Diagnosis not present

## 2020-12-15 DIAGNOSIS — M47812 Spondylosis without myelopathy or radiculopathy, cervical region: Secondary | ICD-10-CM | POA: Diagnosis not present

## 2020-12-15 LAB — ECHOCARDIOGRAM COMPLETE
AR max vel: 2.69 cm2
AV Area VTI: 2.57 cm2
AV Area mean vel: 2.59 cm2
AV Mean grad: 8.7 mmHg
AV Peak grad: 17.8 mmHg
Ao pk vel: 2.11 m/s
Area-P 1/2: 2.81 cm2
S' Lateral: 2.6 cm

## 2020-12-15 NOTE — ED Provider Notes (Signed)
[facility] DEPT Provider Note   CSN: [id number] Arrival date & time: 12/15/20  1700     History Chief Complaint  Patient presents with   [name] is a 75 y.o. female.  HPI Patient is anticoagulated on twice daily Eliquis.  She was at the restaurant standing up off of a toilet and lost her balance.  She fell backwards and hit her head.  She did not have any loss of consciousness.  Patient denies any headache.  She reports she feels like she normally does.  She reports she came to make sure it was okay.  No other injury.    Past Medical History:  Diagnosis Date   Arthritis    Coronary artery disease    s/p PCI of LAD/RCA, cath 2007 with 30% mid LAD, 20% OM1, 70% distal left circ too small for PCI, 90% acute RV mariginal too small for PCI   Depression    Diastolic dysfunction    GERD (gastroesophageal reflux disease)    Headache    hx migraines 32 yrs ago   Hyperlipemia    Hypertension    OSA (obstructive sleep apnea)    PAF (paroxysmal atrial fibrillation) (HCC)    RLS (restless legs syndrome)     Patient Active Problem List   Diagnosis Date Noted   Pain in left arm 07/06/2020   Angina pectoris ([place]) 06/25/2020   Anxiety 06/25/2020   Dyslipidemia 06/25/2020   Gastroesophageal reflux disease 06/25/2020   Hypercoagulable state ([place]) 06/25/2020   Moderate major depression, single episode ([place]) 06/25/2020   Mood disorder ([place]) 06/25/2020   Prediabetes 06/25/2020   Psoriasis 06/25/2020   Acute on chronic diastolic CHF (congestive heart failure) ([place]) 01/10/2020   New onset atrial fibrillation ([place]) 01/09/2020   Coagulopathy ([place]) 01/09/2020   Paroxysmal atrial fibrillation ([place]) 01/09/2020   Degeneration of lumbar intervertebral disc 08/14/2019   Degenerative scoliosis 08/12/2019   History of heart block 08/12/2019   Dementia with behavioral disturbance ([place]) 10/21/2018   Trochanteric bursitis of left hip 10/05/2017    Impaired fasting glucose 10/20/2014   Screening for gout 10/20/2014   Insomnia 02/13/2013   Coronary artery disease    Hypertension    Hyperlipemia    Diastolic dysfunction    RLS (restless legs syndrome)    Memory loss 08/29/2012   TIA (transient ischemic attack) 07/31/2012   Muscle spasm 07/31/2012    Past Surgical History:  Procedure Laterality Date   both wrist surgery  [place]     COLONOSCOPY WITH PROPOFOL N/A 08/23/2016   Procedure: COLONOSCOPY WITH PROPOFOL;  Surgeon: Garlan Fair, MD;  Location: [facility];  Service: Endoscopy;  Laterality: N/A;   CORONARY STENT PLACEMENT  2006   x2   LUMBAR DISC SURGERY     ROTATOR CUFF REPAIR Left    shoulder   TENDON REPAIR Right    ankle   TOTAL KNEE ARTHROPLASTY Left 12/21/2012   Procedure: LEFT TOTAL KNEE ARTHROPLASTY;  Surgeon: Augustin Schooling, MD;  Location: [facility];  Service: Orthopedics;  Laterality: Left;     OB History   No obstetric history on file.     Family History  Problem Relation Age of Onset   Heart Problems Mother    Dementia Mother    Heart Problems Father    Lymphoma Sister    ALS Sister    Parkinson's disease Sister    Dementia  Sister    Lymphoma Sister     Social History   Tobacco Use   Smoking status: Never   Smokeless tobacco: Never  Vaping Use   Vaping Use: Never used  Substance Use Topics   Alcohol use: No   Drug use: No    Home Medications Prior to Admission medications   Medication Sig Start Date End Date Taking? Authorizing Provider  amiodarone (PACERONE) 200 MG tablet Take 1 tablet by mouth daily.    [provider]  amLODipine (NORVASC) 5 MG tablet Take 1 tablet (5 mg total) by mouth daily. 12/02/20   Sueanne Margarita, MD  Calcium Carbonate-Vitamin D 600-400 MG-UNIT tablet Take 1 tablet by mouth daily.    [provider]  Carboxymethylcellul-Glycerin (CLEAR EYES FOR DRY EYES OP) Place 1 drop into both eyes 2 (two)  times daily.    [provider]  Cholecalciferol (VITAMIN D) 2000 units CAPS Take 2,000 Units by mouth daily.    [provider]  Collagen Matrix Fenest, [place], 6X9CM SHEE Apply topically daily.    [provider]  diltiazem (CARDIZEM) 30 MG tablet Take 1 tablet every 4 hours AS NEEDED for AFIB heart rate >100 02/25/20   Sherran Needs, NP  ELIQUIS 5 MG TABS tablet TAKE 1 TABLET TWICE A DAY 11/05/20   Sueanne Margarita, MD  ezetimibe (ZETIA) 10 MG tablet Take 10 mg by mouth every evening.     [provider]  furosemide (LASIX) 20 MG tablet Take 1 tablet (20 mg total) by mouth daily. 12/02/20   Sueanne Margarita, MD  isosorbide mononitrate (IMDUR) 60 MG 24 hr tablet Take 60 mg by mouth daily.    [provider]  losartan (COZAAR) 25 MG tablet Take 1 tablet (25 mg total) by mouth daily. 12/02/20   Sueanne Margarita, MD  Magnesium 500 MG TABS Take 1 tablet by mouth daily in the afternoon.    [provider]  Omega-3 Fatty Acids (FISH OIL PO) Take 1 capsule by mouth daily.     [provider]  pantoprazole (PROTONIX) 40 MG tablet Take 40 mg by mouth daily.    [provider]  pravastatin (PRAVACHOL) 80 MG tablet Take 80 mg by mouth every evening.    [provider]  rOPINIRole (REQUIP) 0.5 MG tablet Take 0.5-1 mg by mouth 2 (two) times daily. For restless leg syndrome 1 IN THE MORNING AND 2 AT NIGHT    [provider]  sertraline (ZOLOFT) 50 MG tablet Take 50 mg by mouth at bedtime.     [provider]    Allergies    Lipitor [atorvastatin], Other, Prednisone, and Limbitrol ds  [chlordiazepoxide-amitriptyline]  Review of Systems   Review of Systems 10 systems reviewed negative except as per HPI Physical Exam Updated Vital Signs BP (!) 141/67 (BP Location: Left Arm)   Pulse (!) 58   Temp 98.4 F (36.9 C) (Oral)   Resp 16   Ht 5\' 4"  (1.626 m)   Wt 98.4 kg   SpO2 95%   BMI 37.25 kg/m   Physical  Exam Constitutional:      Appearance: Normal appearance.  HENT:     Mouth/Throat:     Mouth: Mucous membranes are moist.     Pharynx: Oropharynx is clear.  Eyes:     Extraocular Movements: Extraocular movements intact.  Cardiovascular:     Rate and Rhythm: Normal rate and regular rhythm.  Pulmonary:  Effort: Pulmonary effort is normal.     Breath sounds: Normal breath sounds.  Abdominal:     General: There is no distension.     Palpations: Abdomen is soft.     Tenderness: There is no abdominal tenderness. There is no guarding.  Musculoskeletal:        General: Normal range of motion.     Cervical back: Neck supple.  Skin:    General: Skin is warm and dry.  Neurological:     General: No focal deficit present.     Mental Status: She is alert and oriented to person, place, and time.     Motor: No weakness.     Coordination: Coordination normal.  Psychiatric:        Mood and Affect: Mood normal.    ED Results / Procedures / Treatments   Labs (all labs ordered are listed, but only abnormal results are displayed) Labs Reviewed - No data to display  EKG None  Radiology CT Head Wo Contrast  Result Date: 12/15/2020 CLINICAL DATA:  Status post fall. EXAM: CT HEAD WITHOUT CONTRAST TECHNIQUE: Contiguous axial images were obtained from the base of the skull through the vertex without intravenous contrast. COMPARISON:  January 01, 2019 FINDINGS: Brain: There is mild to moderate severity cerebral atrophy with widening of the extra-axial spaces and ventricular dilatation. There are areas of decreased attenuation within the white matter tracts of the supratentorial brain, consistent with microvascular disease changes. Vascular: No hyperdense vessel or unexpected calcification. Skull: Normal. Negative for fracture or focal lesion. Sinuses/Orbits: No acute finding. Other: None. IMPRESSION: 1. Generalized cerebral atrophy. 2. No acute intracranial abnormality. Electronically Signed   By:  Virgina Norfolk M.D.   On: 12/15/2020 18:23   CT Cervical Spine Wo Contrast  Result Date: 12/15/2020 CLINICAL DATA:  Status post fall. EXAM: CT CERVICAL SPINE WITHOUT CONTRAST TECHNIQUE: Multidetector CT imaging of the cervical spine was performed without intravenous contrast. Multiplanar CT image reconstructions were also generated. COMPARISON:  None. FINDINGS: Alignment: There is approximately 2 mm anterolisthesis of the C3 vertebral body on C4. Skull base and vertebrae: No acute fracture. No primary bone lesion or focal pathologic process. Soft tissues and spinal canal: No prevertebral fluid or swelling. No visible canal hematoma. Disc levels: Moderate to marked severity endplate sclerosis is seen at the levels of C5-C6 and C6-C7. There is marked severity narrowing of the anterior atlantoaxial articulation. Mild intervertebral disc space narrowing is seen at the levels of C3-C4 and C4-C5. Marked severity intervertebral disc space narrowing is seen at the levels of C5-C6 and C6-C7. Marked severity, bilateral, multilevel facet joint hypertrophy is noted. Upper chest: Negative. Other: None. IMPRESSION: 1. No acute cervical spine fracture. 2. 2 mm anterolisthesis of the C3 vertebral body on C4, likely degenerative. 3. Multilevel degenerative changes, most notable at the levels of C5-C6 and C6-C7. Electronically Signed   By: Virgina Norfolk M.D.   On: 12/15/2020 18:27   ECHOCARDIOGRAM COMPLETE  Result Date: 12/15/2020    ECHOCARDIOGRAM REPORT   Patient Name:   [name] Date of Exam: 12/15/2020 Medical Rec #:  [id number]         Height:       64.0 in Accession #:    [id number]        Weight:       218.8 lb Date of Birth:  1945-10-23        BSA:          2.033 m Patient Age:  74 years          BP:           142/74 mmHg Patient Gender: F                 HR:           54 bpm. Exam Location:  [facility] Procedure: 2D Echo, 3D Echo, Cardiac Doppler and Color Doppler Indications:    R06.02 Shortness  of Breath                 I25.10 CAD  History:        Patient has prior history of Echocardiogram examinations, most                 recent 01/10/2020. CAD, Arrythmias:Atrial Fibrillation,                 Signs/Symptoms:Shortness of Breath, Edema and Fatigue; Risk                 Factors:Hypertension, Dyslipidemia, Family History of Coronary                 Artery Disease and Sleep Apnea.  Sonographer:    Deliah Boston RDCS Referring Phys: Eber Hong TURNER IMPRESSIONS  1. Left ventricular ejection fraction, by estimation, is 60 to 65%. The left ventricle has normal function. The left ventricle has no regional wall motion abnormalities. There is mild left ventricular hypertrophy. Left ventricular diastolic parameters are consistent with Grade I diastolic dysfunction (impaired relaxation).  2. Right ventricular systolic function is normal. The right ventricular size is normal. There is mildly elevated pulmonary artery systolic pressure.  3. Left atrial size was moderately dilated.  4. Right atrial size was moderately dilated.  5. The mitral valve is normal in structure. Trivial mitral valve regurgitation. No evidence of mitral stenosis. Moderate mitral annular calcification.  6. The aortic valve is normal in structure. Aortic valve regurgitation is not visualized. No aortic stenosis is present.  7. The inferior vena cava is normal in size with greater than 50% respiratory variability, suggesting right atrial pressure of 3 mmHg. Comparison(s): No significant change from prior study. Prior images reviewed side by side. FINDINGS  Left Ventricle: Left ventricular ejection fraction, by estimation, is 60 to 65%. The left ventricle has normal function. The left ventricle has no regional wall motion abnormalities. The left ventricular internal cavity size was normal in size. There is  mild left ventricular hypertrophy. Left ventricular diastolic parameters are consistent with Grade I diastolic dysfunction (impaired  relaxation). Right Ventricle: The right ventricular size is normal. No increase in right ventricular wall thickness. Right ventricular systolic function is normal. There is mildly elevated pulmonary artery systolic pressure. The tricuspid regurgitant velocity is 2.98  m/s, and with an assumed right atrial pressure of 3 mmHg, the estimated right ventricular systolic pressure is 46.2 mmHg. Left Atrium: Left atrial size was moderately dilated. Right Atrium: Right atrial size was moderately dilated. Pericardium: There is no evidence of pericardial effusion. Mitral Valve: The mitral valve is normal in structure. Moderate mitral annular calcification. Trivial mitral valve regurgitation. No evidence of mitral valve stenosis. Tricuspid Valve: The tricuspid valve is normal in structure. Tricuspid valve regurgitation is not demonstrated. No evidence of tricuspid stenosis. Aortic Valve: The aortic valve is normal in structure. Aortic valve regurgitation is not visualized. No aortic stenosis is present. Aortic valve mean gradient measures 8.7 mmHg. Aortic valve peak gradient measures 17.8 mmHg. Aortic valve area, by VTI  measures 2.57 cm. Pulmonic Valve: The pulmonic valve was normal in structure. Pulmonic valve regurgitation is trivial. No evidence of pulmonic stenosis. Aorta: The aortic root is normal in size and structure. Venous: The inferior vena cava is normal in size with greater than 50% respiratory variability, suggesting right atrial pressure of 3 mmHg. IAS/Shunts: No atrial level shunt detected by color flow Doppler.  LEFT VENTRICLE PLAX 2D LVIDd:         4.15 cm  Diastology LVIDs:         2.60 cm  LV e' medial:    7.51 cm/s LV PW:         1.10 cm  LV E/e' medial:  16.2 LV IVS:        1.25 cm  LV e' lateral:   9.57 cm/s LVOT diam:     2.20 cm  LV E/e' lateral: 12.7 LV SV:         119 LV SV Index:   59 LVOT Area:     3.80 cm                          3D Volume EF:                         3D EF:        69 %                          LV EDV:       165 ml                         LV ESV:       52 ml                         LV SV:        113 ml RIGHT VENTRICLE RV S prime:     20.95 cm/s TAPSE (M-mode): 3.2 cm LEFT ATRIUM             Index       RIGHT ATRIUM           Index LA diam:        3.90 cm 1.92 cm/m  RA Area:     24.40 cm LA Vol (A2C):   68.0 ml 33.46 ml/m RA Volume:   85.00 ml  41.82 ml/m LA Vol (A4C):   95.0 ml 46.74 ml/m LA Biplane Vol: 83.0 ml 40.84 ml/m  AORTIC VALVE AV Area (Vmax):    2.69 cm AV Area (Vmean):   2.59 cm AV Area (VTI):     2.57 cm AV Vmax:           211.00 cm/s AV Vmean:          134.667 cm/s AV VTI:            0.463 m AV Peak Grad:      17.8 mmHg AV Mean Grad:      8.7 mmHg LVOT Vmax:         149.50 cm/s LVOT Vmean:        91.650 cm/s LVOT VTI:          0.313 m LVOT/AV VTI ratio: 0.68  AORTA Ao Root diam: 3.50 cm Ao Asc diam:  3.70 cm MITRAL VALVE  TRICUSPID VALVE MV Area (PHT): cm          TR Peak grad:   35.5 mmHg MV Decel Time: 270 msec     TR Vmax:        298.00 cm/s MV E velocity: 122.00 cm/s MV A velocity: 115.50 cm/s  SHUNTS MV E/A ratio:  1.06         Systemic VTI:  0.31 m                             Systemic Diam: 2.20 cm Candee Furbish MD Electronically signed by Candee Furbish MD Signature Date/Time: 12/15/2020/2:33:39 PM    Final     Procedures Procedures   Medications Ordered in ED Medications - No data to display  ED Course  I have reviewed the triage vital signs and the nursing notes.  Pertinent labs & imaging results that were available during my care of the patient were reviewed by me and considered in my medical decision making (see chart for details).    MDM Rules/Calculators/A&P                           Patient is anticoagulated on Eliquis.  She had a fall today from standing for toilet.  It was mechanical fall with loss of balance.  No syncope.  No persisting headache or any neurologic deficits.  CT head and CT C-spine without acute findings.  Stable  for continued home management with head injury return precautions provided. Final Clinical Impression(s) / ED Diagnoses Final diagnoses:  Fall, initial encounter  Minor head injury, initial encounter  Anticoagulated    Rx / DC Orders ED Discharge Orders     None        Charlesetta Shanks, MD 12/15/20 1842

## 2020-12-15 NOTE — ED Provider Notes (Signed)
Emergency Medicine Provider Triage Evaluation Note  [name] , a 75 y.o. female  was evaluated in triage.  Pt complains of fall on thinners.  She takes Eliquis every day.  She was trying to get up off the toilet and fell backwards hitting her head as she was in a normal room instead of a handicapped room and did not have the grip bars.  She hit the back of her head.  Did not pass out..  Review of Systems  Positive: Fall on thinners Negative: Loss of consciousness  Physical Exam  BP (!) 141/67 (BP Location: Left Arm)   Pulse (!) 58   Temp 98.4 F (36.9 C) (Oral)   Resp 16   Ht 5\' 4"  (1.626 m)   Wt 98.4 kg   SpO2 95%   BMI 37.25 kg/m  Gen:   Awake, no distress   Resp:  Normal effort  MSK:   Moves extremities without difficulty  Other:  Patient is awake and alert.  Speech is not slurred.  Medical Decision Making  Medically screening exam initiated at 5:22 PM.  Appropriate orders placed.  [name] was informed that the remainder of the evaluation will be completed by another provider, this initial triage assessment does not replace that evaluation, and the importance of remaining in the ED until their evaluation is complete.  Asked RN to get CT to come take patient immediately given that she is a fall on thinners.  Ordered CT head and CT neck.   Lorin Glass, PA-C 12/15/20 1724    Charlesetta Shanks, MD 12/18/20 [phone]

## 2020-12-15 NOTE — ED Triage Notes (Signed)
Pt states she was trying to get up off the toilet and fell backwards, hitting her head. Alert and oriented. Pt is on eliquis, twice a day.

## 2020-12-21 ENCOUNTER — Ambulatory Visit (HOSPITAL_COMMUNITY): Payer: Medicare Other

## 2020-12-23 ENCOUNTER — Encounter: Payer: Self-pay | Admitting: Cardiology

## 2020-12-23 ENCOUNTER — Other Ambulatory Visit: Payer: Self-pay | Admitting: Cardiology

## 2020-12-23 ENCOUNTER — Other Ambulatory Visit: Payer: Self-pay

## 2020-12-23 ENCOUNTER — Ambulatory Visit (INDEPENDENT_AMBULATORY_CARE_PROVIDER_SITE_OTHER): Payer: Medicare Other | Admitting: Cardiology

## 2020-12-23 VITALS — BP 152/64 | HR 75 | Ht 64.0 in | Wt 216.2 lb

## 2020-12-23 DIAGNOSIS — I10 Essential (primary) hypertension: Secondary | ICD-10-CM

## 2020-12-23 DIAGNOSIS — E7800 Pure hypercholesterolemia, unspecified: Secondary | ICD-10-CM

## 2020-12-23 DIAGNOSIS — M7989 Other specified soft tissue disorders: Secondary | ICD-10-CM

## 2020-12-23 DIAGNOSIS — I2510 Atherosclerotic heart disease of native coronary artery without angina pectoris: Secondary | ICD-10-CM

## 2020-12-23 DIAGNOSIS — I480 Paroxysmal atrial fibrillation: Secondary | ICD-10-CM | POA: Diagnosis not present

## 2020-12-23 DIAGNOSIS — R072 Precordial pain: Secondary | ICD-10-CM

## 2020-12-23 DIAGNOSIS — R0602 Shortness of breath: Secondary | ICD-10-CM | POA: Diagnosis not present

## 2020-12-23 MED ORDER — LOSARTAN POTASSIUM 100 MG PO TABS: 100.0000 mg | ORAL_TABLET | Freq: Every day | ORAL | 0 refills | Status: DC

## 2020-12-23 NOTE — Addendum Note (Signed)
Addended by: Antonieta Iba on: 12/23/2020 12:04 PM   Modules accepted: Orders

## 2020-12-23 NOTE — Patient Instructions (Addendum)
Medication Instructions:  Your physician has recommended you make the following change in your medication: 1) STOP taking amlodipine  2) INCREASE losartan to 100 mg daily  *If you need a refill on your cardiac medications before your next appointment, please call your pharmacy*   Lab Work: BMET in one week  If you have labs (blood work) drawn today and your tests are completely normal, you will receive your results only by: [facility] (if you have MyChart) OR A paper copy in the mail If you have any lab test that is abnormal or we need to change your treatment, we will call you to review the results.   Testing/Procedures: Your physician has requested that you have cardiac chest CT. Cardiac computed tomography (CT) is a painless test that uses an x-ray machine to take clear, detailed pictures of your heart. For further information please visit [email or web address]. Please follow instruction sheet as given.  Follow-Up: At [facility], you and your health needs are our priority.  As part of our continuing mission to provide you with exceptional heart care, we have created designated Provider Care Teams.  These Care Teams include your primary Cardiologist (physician) and Advanced Practice Providers (APPs -  Physician Assistants and Nurse Practitioners) who all work together to provide you with the care you need, when you need it.   Your next appointment:   3 months  The format for your next appointment:   In Person  Provider:   You may see Fransico Him, MD or one of the following Advanced Practice Providers on your designated Care Team:   Melina Copa, PA-C Ermalinda Barrios, PA-C

## 2020-12-23 NOTE — Progress Notes (Signed)
Cardiology Office Note:    Date:  12/23/2020   ID:  [name], DOB 01/01/1946, MRN [id number]  PCP:  Gaynelle Arabian, MD  Cardiologist:  Fransico Him, MD    Referring MD: Gaynelle Arabian, MD   Chief Complaint  Patient presents with   Coronary Artery Disease   Hypertension   Hyperlipidemia   Atrial Fibrillation     History of Present Illness:    [name] is a 75 y.o. female with a hx of CAD (s/p PCI of LAD/RCA 2007 with 30% mid LAD, 20% OM1, 70% distal left circ too small for PCI, 90% acute RV mariginal too small for PCI), HTN, PAF and dyslipidemia.    When I saw her a few weeks ago she was complaining of progressive LE edema.  BNP was normal in July.  LE venous dopplers showed no DVT but did show bilateral Baker's cysts. She thought that it might be the amlodipine since her sx started after going on the amlodipine.  She tries to eat a low Na diet but dose eat out some.    Her amlodipine was decreased to 5mg  daily and Lasix 20mg  daily was added.  2D echo showed normal LVF with moderate BAE and minimal PHTN with PASP 65mmHg.   She is here today for followup and is doing well.  She denies any chest pain or pressure, PND, orthopnea,  dizziness, palpitations or syncope. She is compliant with her meds and is tolerating meds with no SE.   She tells me that her SOB has improved and her LE edema has improved although she still has some. She gets SOB mainly when she gets in bed.   Past Medical History:  Diagnosis Date   Arthritis    Coronary artery disease    s/p PCI of LAD/RCA, cath 2007 with 30% mid LAD, 20% OM1, 70% distal left circ too small for PCI, 90% acute RV mariginal too small for PCI   Depression    Diastolic dysfunction    GERD (gastroesophageal reflux disease)    Headache    hx migraines 32 yrs ago   Hyperlipemia    Hypertension    OSA (obstructive sleep apnea)    PAF (paroxysmal atrial fibrillation) (HCC)    RLS (restless legs syndrome)     Past  Surgical History:  Procedure Laterality Date   both wrist surgery  1985   CARDIAC CATHETERIZATION     CHOLECYSTECTOMY     COLONOSCOPY WITH PROPOFOL N/A 08/23/2016   Procedure: COLONOSCOPY WITH PROPOFOL;  Surgeon: Kristi Fair, MD;  Location: [facility];  Service: Endoscopy;  Laterality: N/A;   CORONARY STENT PLACEMENT  2006   x2   LUMBAR DISC SURGERY     ROTATOR CUFF REPAIR Left    shoulder   TENDON REPAIR Right    ankle   TOTAL KNEE ARTHROPLASTY Left 12/21/2012   Procedure: LEFT TOTAL KNEE ARTHROPLASTY;  Surgeon: Kristi Schooling, MD;  Location: [facility];  Service: Orthopedics;  Laterality: Left;    Current Medications: Current Meds  Medication Sig   amiodarone (PACERONE) 200 MG tablet Take 1 tablet by mouth daily.   amLODipine (NORVASC) 5 MG tablet Take 1 tablet (5 mg total) by mouth daily.   Calcium Carbonate-Vitamin D 600-400 MG-UNIT tablet Take 1 tablet by mouth daily.   Carboxymethylcellul-Glycerin (CLEAR EYES FOR DRY EYES OP) Place 1 drop into both eyes 2 (two) times daily.   Cholecalciferol (VITAMIN D) 2000 units CAPS Take 2,000 Units by  mouth daily.   Collagen Matrix Fenest, [place], 6X9CM SHEE Apply topically daily.   diltiazem (CARDIZEM) 30 MG tablet Take 1 tablet every 4 hours AS NEEDED for AFIB heart rate >100   ELIQUIS 5 MG TABS tablet TAKE 1 TABLET TWICE A DAY   ezetimibe (ZETIA) 10 MG tablet Take 10 mg by mouth every evening.    furosemide (LASIX) 20 MG tablet Take 1 tablet (20 mg total) by mouth daily.   isosorbide mononitrate (IMDUR) 60 MG 24 hr tablet Take 60 mg by mouth daily.   losartan (COZAAR) 25 MG tablet Take 1 tablet (25 mg total) by mouth daily.   Magnesium 500 MG TABS Take 1 tablet by mouth daily in the afternoon.   Omega-3 Fatty Acids (FISH OIL PO) Take 1 capsule by mouth daily.    pantoprazole (PROTONIX) 40 MG tablet Take 40 mg by mouth daily.   pravastatin (PRAVACHOL) 80 MG tablet Take 80 mg by mouth every evening.   rOPINIRole (REQUIP) 0.5 MG tablet  Take 0.5-1 mg by mouth 2 (two) times daily. For restless leg syndrome 1 IN THE MORNING AND 2 AT NIGHT   sertraline (ZOLOFT) 50 MG tablet Take 50 mg by mouth at bedtime.      Allergies:   Lipitor [atorvastatin], Other, Prednisone, and Limbitrol ds  [chlordiazepoxide-amitriptyline]   Social History   Socioeconomic History   Marital status: Legally Separated    Spouse name: [name]   Number of children: 1   Years of education: GED   Highest education level: Not on file  Occupational History   Occupation: Retired    Fish farm manager: UNEMPLOYED  Tobacco Use   Smoking status: Never   Smokeless tobacco: Never  Vaping Use   Vaping Use: Never used  Substance and Sexual Activity   Alcohol use: No   Drug use: No   Sexual activity: Not on file  Other Topics Concern   Not on file  Social History Narrative   Pt lives at home alone.   Caffeine: 2-3 cups of coffee daily   Her daughter manages her medications   Social Determinants of Health   Financial Resource Strain: Not on file  Food Insecurity: Not on file  Transportation Needs: Not on file  Physical Activity: Not on file  Stress: Not on file  Social Connections: Not on file     Family History: The patient's family history includes ALS in her sister; Dementia in her mother and sister; Heart Problems in her father and mother; Lymphoma in her sister and sister; Parkinson's disease in her sister.  ROS:   Please see the history of present illness.    ROS  All other systems reviewed and negative.   EKGs/Labs/Other Studies Reviewed:    The following studies were reviewed today: none  EKG:  EKG is not ordered today.    Recent Labs: 01/09/2020: Magnesium 2.1 02/02/2020: Hemoglobin 14.0; Platelets 215 04/08/2020: ALT 18 12/01/2020: BUN 13; Creatinine, Ser 0.67; NT-Pro BNP 288; Potassium 4.3; Sodium 136; TSH 2.430   Recent Lipid Panel    Component Value Date/Time   CHOL 165 06/03/2020 1009   TRIG 102 06/03/2020 1009   HDL 76  06/03/2020 1009   CHOLHDL 2.2 06/03/2020 1009   CHOLHDL 2.5 01/10/2020 0054   VLDL 19 01/10/2020 0054   LDLCALC 71 06/03/2020 1009    Physical Exam:    VS:  BP (!) 152/64   Pulse 75   Ht 5\' 4"  (1.626 m)   Wt 216 lb 3.2 oz (  98.1 kg)   SpO2 94%   BMI 37.11 kg/m     Wt Readings from Last 3 Encounters:  12/23/20 216 lb 3.2 oz (98.1 kg)  12/15/20 217 lb (98.4 kg)  12/01/20 218 lb 12.8 oz (99.2 kg)     GEN: Well nourished, well developed in no acute distress HEENT: Normal NECK: No JVD; No carotid bruits LYMPHATICS: No lymphadenopathy CARDIAC:RRR, no  rubs, gallops.  2/6 SM at RUSB RESPIRATORY:  Clear to auscultation without rales, wheezing or rhonchi  ABDOMEN: Soft, non-tender, non-distended MUSCULOSKELETAL:  1+ RLE edema; No deformity  SKIN: Warm and dry NEUROLOGIC:  Alert and oriented x 3 PSYCHIATRIC:  Normal affect   ASSESSMENT:    1. Coronary artery disease involving native coronary artery of native heart without angina pectoris   2. Primary hypertension   3. Pure hypercholesterolemia   4. Paroxysmal atrial fibrillation (HCC)   5. SOB (shortness of breath)   6. Leg swelling     PLAN:    In order of problems listed above:  1.  ASCAD  -s/p PCI of LAD/RCA 2007 with 30% mid LAD, 20% OM1, 70% distal left circ too small for PCI, 90% acute RV mariginal too small for PCI.   -she denies any anginal symptoms -Continue prescription drug management with ASA 81mg  daily,Plavix 75mg  daily, Imdur 60mg  daily and statin with PRN refills   2.  HTN  -BP is borderline controlled on exam today -Stop amlodipine due to ongoing edema and increase Losartan to 100mg  daily -check BMET in 1 week -SCr was 0.67 and K+ 4.3 on 12/01/2020  3.  Hyperlipidemia  -LDL goal is less than 70.   -LDL was 72 in March 2022 -Continue prescription drug management with Zetia 10mg  daily and Crestor 20mg  daily>refilled   4.  PAF -she is maintaining NSR On exam today -Continue prescription drug  management with Amio 200mg  daily and ELiquis 5mg  BID with PRN refills  -continue PRN Cardizem and Toprol for breakthrough -TSH and ALT normal this summer -PFTs normal on 12/08/2020  5.  SOB -this is new in the past year along with LE edema -2D echo showed normal LVF with G1DD -LE venous dopplers showed no DVT but did show bilateral Baker's cysts -ddimer was mildly elevated and PCP did not do a Chest CTA>I ordered a VQ scan but this has not been done -BNP was normal -I will get a chest CTA to rule out PE  6.  LE edema -this is new recently -likely related to to amlodipine as it is improved but still present -stop amlodipine -continue Lasix 20mg  daily    Medication Adjustments/Labs and Tests Ordered: Current medicines are reviewed at length with the patient today.  Concerns regarding medicines are outlined above.  No orders of the defined types were placed in this encounter.  No orders of the defined types were placed in this encounter.   Signed, Fransico Him, MD  12/23/2020 11:47 AM    [place]

## 2020-12-24 ENCOUNTER — Telehealth: Payer: Self-pay | Admitting: Nurse Practitioner

## 2020-12-24 DIAGNOSIS — R0602 Shortness of breath: Secondary | ICD-10-CM

## 2020-12-24 NOTE — Telephone Encounter (Signed)
Received message from [place] department that patient needs stat bmet for stat CT ordered for today. Order has been placed and Kathlen Brunswick from [facility] states she will notify the patient.

## 2020-12-28 ENCOUNTER — Other Ambulatory Visit: Payer: Self-pay

## 2020-12-28 ENCOUNTER — Ambulatory Visit (INDEPENDENT_AMBULATORY_CARE_PROVIDER_SITE_OTHER)
Admission: RE | Admit: 2020-12-28 | Discharge: 2020-12-28 | Disposition: A | Discharge status: Home / Self Care | Payer: Medicare Other | Source: Ambulatory Visit | Attending: Cardiology | Admitting: Cardiology

## 2020-12-28 ENCOUNTER — Other Ambulatory Visit: Payer: Medicare Other | Admitting: *Deleted

## 2020-12-28 DIAGNOSIS — R0602 Shortness of breath: Secondary | ICD-10-CM | POA: Diagnosis not present

## 2020-12-28 DIAGNOSIS — R072 Precordial pain: Secondary | ICD-10-CM | POA: Diagnosis not present

## 2020-12-28 DIAGNOSIS — I517 Cardiomegaly: Secondary | ICD-10-CM | POA: Diagnosis not present

## 2020-12-28 LAB — BASIC METABOLIC PANEL
BUN/Creatinine Ratio: 23 (ref 12–28)
BUN: 17 mg/dL (ref 8–27)
CO2: 30 mmol/L — ABNORMAL HIGH (ref 20–29)
Calcium: 10.1 mg/dL (ref 8.7–10.3)
Chloride: 102 mmol/L (ref 96–106)
Creatinine, Ser: 0.75 mg/dL (ref 0.57–1.00)
Glucose: 104 mg/dL — ABNORMAL HIGH (ref 70–99)
Potassium: 4.4 mmol/L (ref 3.5–5.2)
Sodium: 137 mmol/L (ref 134–144)
eGFR: 83 mL/min/{1.73_m2} (ref >59)

## 2020-12-28 MED ORDER — IOHEXOL 350 MG/ML SOLN
80.0000 mL | Freq: Once | INTRAVENOUS | Status: AC | PRN
  Administered 2020-12-28: 80 mL via INTRAVENOUS

## 2020-12-30 ENCOUNTER — Other Ambulatory Visit: Payer: Medicare Other

## 2020-12-31 ENCOUNTER — Other Ambulatory Visit: Payer: Self-pay

## 2020-12-31 ENCOUNTER — Ambulatory Visit (INDEPENDENT_AMBULATORY_CARE_PROVIDER_SITE_OTHER): Payer: Medicare Other | Admitting: Podiatry

## 2020-12-31 DIAGNOSIS — M21961 Unspecified acquired deformity of right lower leg: Secondary | ICD-10-CM | POA: Diagnosis not present

## 2021-01-01 DIAGNOSIS — H9193 Unspecified hearing loss, bilateral: Secondary | ICD-10-CM | POA: Diagnosis not present

## 2021-01-01 DIAGNOSIS — H61303 Acquired stenosis of external ear canal, unspecified, bilateral: Secondary | ICD-10-CM | POA: Diagnosis not present

## 2021-01-01 DIAGNOSIS — H6123 Impacted cerumen, bilateral: Secondary | ICD-10-CM | POA: Diagnosis not present

## 2021-01-07 NOTE — Progress Notes (Signed)
  Subjective:  Patient ID: [name], female    DOB: 1945-07-19,  MRN: [id number]  No chief complaint on file.  75 y.o. female presents for f/u. Complains of recurrence of painful lesion under the foot. Denies new issues.  Objective:  Physical Exam: warm, good capillary refill, no trophic changes or ulcerative lesions, normal DP and PT pulses and normal sensory exam.  Right Foot: HPK submet 1  Assessment:   1. Metatarsal deformity, right     Plan:  Patient was evaluated and treated and all questions answered.  Callus, Coag Defect -Again debrided callus to patient tolerance. F/u should issues persist.   No follow-ups on file.

## 2021-01-19 ENCOUNTER — Other Ambulatory Visit: Payer: Self-pay | Admitting: Cardiology

## 2021-02-02 DIAGNOSIS — M79642 Pain in left hand: Secondary | ICD-10-CM | POA: Diagnosis not present

## 2021-02-02 DIAGNOSIS — M25511 Pain in right shoulder: Secondary | ICD-10-CM | POA: Diagnosis not present

## 2021-02-24 ENCOUNTER — Other Ambulatory Visit: Payer: Self-pay | Admitting: Cardiology

## 2021-02-24 DIAGNOSIS — M20012 Mallet finger of left finger(s): Secondary | ICD-10-CM | POA: Diagnosis not present

## 2021-02-24 DIAGNOSIS — L03119 Cellulitis of unspecified part of limb: Secondary | ICD-10-CM | POA: Diagnosis not present

## 2021-02-24 DIAGNOSIS — M25511 Pain in right shoulder: Secondary | ICD-10-CM | POA: Diagnosis not present

## 2021-03-12 ENCOUNTER — Ambulatory Visit: Payer: Medicare Other | Admitting: Cardiology

## 2021-03-23 DIAGNOSIS — M5136 Other intervertebral disc degeneration, lumbar region: Secondary | ICD-10-CM | POA: Diagnosis not present

## 2021-03-23 DIAGNOSIS — M4316 Spondylolisthesis, lumbar region: Secondary | ICD-10-CM | POA: Diagnosis not present

## 2021-04-07 DIAGNOSIS — M6281 Muscle weakness (generalized): Secondary | ICD-10-CM | POA: Diagnosis not present

## 2021-04-07 DIAGNOSIS — M25642 Stiffness of left hand, not elsewhere classified: Secondary | ICD-10-CM | POA: Diagnosis not present

## 2021-04-07 DIAGNOSIS — M20002 Unspecified deformity of left finger(s): Secondary | ICD-10-CM | POA: Diagnosis not present

## 2021-04-12 ENCOUNTER — Ambulatory Visit: Payer: Medicare Other | Admitting: Podiatry

## 2021-04-13 DIAGNOSIS — M79605 Pain in left leg: Secondary | ICD-10-CM | POA: Diagnosis not present

## 2021-04-13 DIAGNOSIS — M48061 Spinal stenosis, lumbar region without neurogenic claudication: Secondary | ICD-10-CM | POA: Diagnosis not present

## 2021-04-16 DIAGNOSIS — M6281 Muscle weakness (generalized): Secondary | ICD-10-CM | POA: Diagnosis not present

## 2021-04-16 DIAGNOSIS — M25642 Stiffness of left hand, not elsewhere classified: Secondary | ICD-10-CM | POA: Diagnosis not present

## 2021-04-16 DIAGNOSIS — M20002 Unspecified deformity of left finger(s): Secondary | ICD-10-CM | POA: Diagnosis not present

## 2021-04-19 ENCOUNTER — Ambulatory Visit (INDEPENDENT_AMBULATORY_CARE_PROVIDER_SITE_OTHER): Payer: Medicare Other | Admitting: Podiatry

## 2021-04-19 DIAGNOSIS — I10 Essential (primary) hypertension: Secondary | ICD-10-CM | POA: Diagnosis not present

## 2021-04-19 DIAGNOSIS — R7303 Prediabetes: Secondary | ICD-10-CM | POA: Diagnosis not present

## 2021-04-19 DIAGNOSIS — Z8673 Personal history of transient ischemic attack (TIA), and cerebral infarction without residual deficits: Secondary | ICD-10-CM | POA: Diagnosis not present

## 2021-04-19 DIAGNOSIS — I4891 Unspecified atrial fibrillation: Secondary | ICD-10-CM | POA: Diagnosis not present

## 2021-04-19 DIAGNOSIS — W19XXXA Unspecified fall, initial encounter: Secondary | ICD-10-CM | POA: Diagnosis not present

## 2021-04-19 DIAGNOSIS — Z91199 Patient's noncompliance with other medical treatment and regimen due to unspecified reason: Secondary | ICD-10-CM

## 2021-04-19 DIAGNOSIS — I25119 Atherosclerotic heart disease of native coronary artery with unspecified angina pectoris: Secondary | ICD-10-CM | POA: Diagnosis not present

## 2021-04-19 DIAGNOSIS — Z0000 Encounter for general adult medical examination without abnormal findings: Secondary | ICD-10-CM | POA: Diagnosis not present

## 2021-04-19 DIAGNOSIS — E7800 Pure hypercholesterolemia, unspecified: Secondary | ICD-10-CM | POA: Diagnosis not present

## 2021-04-19 NOTE — Progress Notes (Signed)
Same day cancellation - appointment conflict

## 2021-04-20 DIAGNOSIS — M25642 Stiffness of left hand, not elsewhere classified: Secondary | ICD-10-CM | POA: Diagnosis not present

## 2021-04-20 DIAGNOSIS — M6281 Muscle weakness (generalized): Secondary | ICD-10-CM | POA: Diagnosis not present

## 2021-04-20 DIAGNOSIS — M20002 Unspecified deformity of left finger(s): Secondary | ICD-10-CM | POA: Diagnosis not present

## 2021-05-06 DIAGNOSIS — M5416 Radiculopathy, lumbar region: Secondary | ICD-10-CM | POA: Diagnosis not present

## 2021-05-12 ENCOUNTER — Other Ambulatory Visit: Payer: Self-pay | Admitting: Cardiology

## 2021-05-12 NOTE — Telephone Encounter (Signed)
Prescription refill request for Eliquis received. Indication: Afib  Last office visit:12/23/20 ([facility])  Scr: 0.75 (12/28/20)  Age: 76 Weight: 98.1kg  Appropriate dose and refill sent to requested pharmacy.

## 2021-05-13 ENCOUNTER — Ambulatory Visit: Payer: Medicare Other | Admitting: Podiatry

## 2021-05-18 DIAGNOSIS — M7061 Trochanteric bursitis, right hip: Secondary | ICD-10-CM | POA: Diagnosis not present

## 2021-05-18 DIAGNOSIS — M7062 Trochanteric bursitis, left hip: Secondary | ICD-10-CM | POA: Diagnosis not present

## 2021-05-18 DIAGNOSIS — M48061 Spinal stenosis, lumbar region without neurogenic claudication: Secondary | ICD-10-CM | POA: Diagnosis not present

## 2021-05-21 DIAGNOSIS — R54 Age-related physical debility: Secondary | ICD-10-CM | POA: Diagnosis not present

## 2021-05-21 DIAGNOSIS — W19XXXD Unspecified fall, subsequent encounter: Secondary | ICD-10-CM | POA: Diagnosis not present

## 2021-05-26 ENCOUNTER — Other Ambulatory Visit: Payer: Self-pay

## 2021-05-26 DIAGNOSIS — I480 Paroxysmal atrial fibrillation: Secondary | ICD-10-CM

## 2021-05-26 MED ORDER — ISOSORBIDE MONONITRATE ER 60 MG PO TB24: 60.0000 mg | ORAL_TABLET | Freq: Every day | ORAL | 6 refills | Status: DC

## 2021-05-26 MED ORDER — APIXABAN 5 MG PO TABS
5.0000 mg | ORAL_TABLET | Freq: Two times a day (BID) | ORAL | 5 refills | Status: DC
Start: 2021-05-26 — End: 2022-10-29

## 2021-05-26 MED ORDER — AMIODARONE HCL 200 MG PO TABS: 200.0000 mg | ORAL_TABLET | Freq: Every day | ORAL | 6 refills | Status: DC

## 2021-05-26 MED ORDER — FUROSEMIDE 20 MG PO TABS: 20.0000 mg | ORAL_TABLET | Freq: Every day | ORAL | 6 refills | Status: DC

## 2021-05-26 MED ORDER — EZETIMIBE 10 MG PO TABS: 10.0000 mg | ORAL_TABLET | Freq: Every evening | ORAL | 6 refills | Status: DC

## 2021-05-26 NOTE — Telephone Encounter (Signed)
Pt's medications were sent to pt's pharmacy as requested. Confirmation received.  

## 2021-05-26 NOTE — Telephone Encounter (Signed)
Prescription refill request for Eliquis received. ?Indication: Afib  ?Last office visit: 12/23/20 [facility])  ?Scr: 0.75 (12/28/20)  ?Age: 76 ?Weight: 98.1kg ? ?Appropriate dose and refill sent to requested pharmacy. ?

## 2021-06-07 ENCOUNTER — Ambulatory Visit (INDEPENDENT_AMBULATORY_CARE_PROVIDER_SITE_OTHER): Payer: Medicare Other | Admitting: Cardiology

## 2021-06-07 ENCOUNTER — Other Ambulatory Visit: Payer: Self-pay

## 2021-06-07 ENCOUNTER — Encounter: Payer: Self-pay | Admitting: Cardiology

## 2021-06-07 VITALS — BP 120/70 | HR 52 | Ht 64.5 in | Wt 210.4 lb

## 2021-06-07 DIAGNOSIS — E7800 Pure hypercholesterolemia, unspecified: Secondary | ICD-10-CM | POA: Diagnosis not present

## 2021-06-07 DIAGNOSIS — M7989 Other specified soft tissue disorders: Secondary | ICD-10-CM

## 2021-06-07 DIAGNOSIS — I480 Paroxysmal atrial fibrillation: Secondary | ICD-10-CM

## 2021-06-07 DIAGNOSIS — I2510 Atherosclerotic heart disease of native coronary artery without angina pectoris: Secondary | ICD-10-CM

## 2021-06-07 DIAGNOSIS — I10 Essential (primary) hypertension: Secondary | ICD-10-CM | POA: Diagnosis not present

## 2021-06-07 DIAGNOSIS — R0602 Shortness of breath: Secondary | ICD-10-CM

## 2021-06-07 NOTE — Progress Notes (Signed)
Cardiology Office Note:    Date:  06/07/2021   ID:  [name], DOB 01/11/1946, MRN [id number]  PCP:  Gaynelle Arabian, MD  Cardiologist:  Fransico Him, MD    Referring MD: Gaynelle Arabian, MD   Chief Complaint  Patient presents with   Coronary Artery Disease   Hypertension   Hyperlipidemia   Atrial Fibrillation     History of Present Illness:    [name] is a 76 y.o. female with a hx of CAD (s/p PCI of LAD/RCA 2007 with 30% mid LAD, 20% OM1, 70% distal left circ too small for PCI, 90% acute RV mariginal too small for PCI), HTN, PAF and dyslipidemia.  2D echo 9/22 done for lower extremity edema showed normal LVF with moderate BAE and minimal PHTN with PASP 66mHg.  Lower extremity venous Doppler showed no DVT but showed Baker's cyst.  She is here today for followup and is doing well.  She denies any chest pain or pressure, SOB, DOE, PND, orthopnea, LE edema, dizziness, palpitations or syncope. She is compliant with her meds and is tolerating meds with no SE.     Past Medical History:  Diagnosis Date   Arthritis    Coronary artery disease    s/p PCI of LAD/RCA, cath 2007 with 30% mid LAD, 20% OM1, 70% distal left circ too small for PCI, 90% acute RV mariginal too small for PCI   Depression    Diastolic dysfunction    GERD (gastroesophageal reflux disease)    Headache    hx migraines 32 yrs ago   Hyperlipemia    Hypertension    OSA (obstructive sleep apnea)    PAF (paroxysmal atrial fibrillation) (HCC)    RLS (restless legs syndrome)     Past Surgical History:  Procedure Laterality Date   both wrist surgery  1985   CARDIAC CATHETERIZATION     CHOLECYSTECTOMY     COLONOSCOPY WITH PROPOFOL N/A 08/23/2016   Procedure: COLONOSCOPY WITH PROPOFOL;  Surgeon: JGarlan Fair MD;  Location: [facility];  Service: Endoscopy;  Laterality: N/A;   CORONARY STENT PLACEMENT  2006   x2   LUMBAR DISC SURGERY     ROTATOR CUFF REPAIR Left    shoulder   TENDON  REPAIR Right    ankle   TOTAL KNEE ARTHROPLASTY Left 12/21/2012   Procedure: LEFT TOTAL KNEE ARTHROPLASTY;  Surgeon: SAugustin Schooling MD;  Location[facility]  Service: Orthopedics;  Laterality: Left;    Current Medications: Current Meds  Medication Sig   acetaminophen (TYLENOL) 650 MG CR tablet 2 tablets as needed   amiodarone (PACERONE) 200 MG tablet Take 1 tablet (200 mg total) by mouth daily.   apixaban (ELIQUIS) 5 MG TABS tablet Take 1 tablet (5 mg total) by mouth 2 (two) times daily.   Calcium Carbonate-Vitamin D 600-400 MG-UNIT tablet Take 1 tablet by mouth daily.   Carboxymethylcellul-Glycerin (CLEAR EYES FOR DRY EYES OP) Place 1 drop into both eyes 2 (two) times daily.   Cholecalciferol (VITAMIN D) 2000 units CAPS Take 2,000 Units by mouth daily.   Collagen Matrix Fenest, [place]c, 6X9CM SHEE Apply topically daily.   diltiazem (CARDIZEM) 30 MG tablet Take 1 tablet every 4 hours AS NEEDED for AFIB heart rate >100   ezetimibe (ZETIA) 10 MG tablet Take 1 tablet (10 mg total) by mouth every evening.   furosemide (LASIX) 20 MG tablet Take 1 tablet (20 mg total) by mouth daily.   isosorbide mononitrate (IMDUR) 60 MG  24 hr tablet Take 1 tablet (60 mg total) by mouth daily.   losartan (COZAAR) 100 MG tablet TAKE 1 TABLET(100 MG) BY MOUTH DAILY   Magnesium 500 MG TABS Take 1 tablet by mouth daily in the afternoon.   Omega-3 Fatty Acids (FISH OIL PO) Take 1 capsule by mouth daily.    pantoprazole (PROTONIX) 40 MG tablet Take 40 mg by mouth daily.   pravastatin (PRAVACHOL) 80 MG tablet Take 80 mg by mouth every evening.   rOPINIRole (REQUIP) 0.5 MG tablet Take 0.5-1 mg by mouth 2 (two) times daily. For restless leg syndrome 1 IN THE MORNING AND 2 AT NIGHT   sertraline (ZOLOFT) 50 MG tablet Take 50 mg by mouth at bedtime.      Allergies:   Lipitor [atorvastatin], Other, Prednisone, and Limbitrol ds  [chlordiazepoxide-amitriptyline]   Social History   Socioeconomic History   Marital status:  Legally Separated    Spouse name: [name]   Number of children: 1   Years of education: GED   Highest education level: Not on file  Occupational History   Occupation: Retired    Fish farm manager: UNEMPLOYED  Tobacco Use   Smoking status: Never   Smokeless tobacco: Never  Vaping Use   Vaping Use: Never used  Substance and Sexual Activity   Alcohol use: No   Drug use: No   Sexual activity: Not on file  Other Topics Concern   Not on file  Social History Narrative   Pt lives at home alone.   Caffeine: 2-3 cups of coffee daily   Her daughter manages her medications   Social Determinants of Health   Financial Resource Strain: Not on file  Food Insecurity: Not on file  Transportation Needs: Not on file  Physical Activity: Not on file  Stress: Not on file  Social Connections: Not on file     Family History: The patient's family history includes ALS in her sister; Dementia in her mother and sister; Heart Problems in her father and mother; Lymphoma in her sister and sister; Parkinson's disease in her sister.  ROS:   Please see the history of present illness.    ROS  All other systems reviewed and negative.   EKGs/Labs/Other Studies Reviewed:    The following studies were reviewed today: none  EKG:  EKG is ordered today and demonstrates sinus bradycardia with LVH by voltage and LAFB  Recent Labs: 12/01/2020: NT-Pro BNP 288; TSH 2.430 12/28/2020: BUN 17; Creatinine, Ser 0.75; Potassium 4.4; Sodium 137   Recent Lipid Panel    Component Value Date/Time   CHOL 165 06/03/2020 1009   TRIG 102 06/03/2020 1009   HDL 76 06/03/2020 1009   CHOLHDL 2.2 06/03/2020 1009   CHOLHDL 2.5 01/10/2020 0054   VLDL 19 01/10/2020 0054   LDLCALC 71 06/03/2020 1009    Physical Exam:    VS:  BP 120/70    Pulse (!) 52    Ht 5' 4.5" (1.638 m)    Wt 210 lb 6.4 oz (95.4 kg)    SpO2 97%    BMI 35.56 kg/m     Wt Readings from Last 3 Encounters:  06/07/21 210 lb 6.4 oz (95.4 kg)  12/23/20 216 lb 3.2  oz (98.1 kg)  12/15/20 217 lb (98.4 kg)     GEN: Well nourished, well developed in no acute distress HEENT: Normal NECK: No JVD; No carotid bruits LYMPHATICS: No lymphadenopathy CARDIAC:RRR, no  rubs, gallops.  2/6 SM at RUSB RESPIRATORY:  Clear to  auscultation without rales, wheezing or rhonchi  ABDOMEN: Soft, non-tender, non-distended MUSCULOSKELETAL:  No edema; No deformity  SKIN: Warm and dry NEUROLOGIC:  Alert and oriented x 3 PSYCHIATRIC:  Normal affect   ASSESSMENT:    1. Coronary artery disease involving native coronary artery of native heart without angina pectoris   2. Primary hypertension   3. Pure hypercholesterolemia   4. Paroxysmal atrial fibrillation (HCC)   5. Shortness of breath   6. Leg swelling     PLAN:    In order of problems listed above:  1.  ASCAD  -s/p PCI of LAD/RCA 2007 with 30% mid LAD, 20% OM1, 70% distal left circ too small for PCI, 90% acute RV mariginal too small for PCI.   -She has not had any anginal symptoms since I saw her last -Continue prescription drug management with aspirin 81 mg daily, Plavix 75 mg daily, Imdur 60 mg daily and statin with as needed refills  2.  HTN  -BP is controlled on exam today -Continue losartan 100 mg daily with as needed refills -Amlodipine was stopped due to lower extremity edema -I have personally reviewed and interpreted outside labs performed by patient's PCP which showed serum creatinine 0.72 potassium 4.3 on 04/19/2021  3.  Hyperlipidemia  -LDL goal is less than 70.   -I have personally reviewed and interpreted outside labs performed by patient's PCP which showed LDL 67, HDL 73, triglycerides 141, ALT 22 on 04/19/2021 -Continue prescription drug management with Zetia 10 mg daily and Pravachol 80 mg daily with as needed refills  4.  PAF -She continues to maintain normal sinus rhythm and denies any palpitations -Continue prescription drug management with amiodarone 200 mg daily and Eliquis 5 mg twice  daily with as needed refills -continue PRN Cardizem and Toprol for breakthrough -I have personally reviewed and interpreted outside labs performed by patient's PCP which showed TSH 2.43 on 12/01/2020, ALT 22 04/19/2021 hemoglobin 11.9 on 10/13/2020  -PFTs normal on 12/08/2020  5.  SOB -this is new in the past year along with LE edema -2D echo showed normal LVF with G1DD -LE venous dopplers showed no DVT but did show bilateral Baker's cysts -ddimer was mildly elevated and chest CT showed noPE -BNP was normal -Her shortness of breath is stable and likely related to diastolic dysfunction as well as sedentary state  6.  LE edema -this is new recently and felt to be related to amlodipine and improved after we stopped the amlodipine -Continue prescription drug management with Lasix 20 mg daily with as needed refills  7.  Heart murmur -no valvular heart disease on echo   Medication Adjustments/Labs and Tests Ordered: Current medicines are reviewed at length with the patient today.  Concerns regarding medicines are outlined above.  Orders Placed This Encounter  Procedures   EKG 12-Lead   No orders of the defined types were placed in this encounter.   Signed, Fransico Him, MD  06/07/2021 10:31 AM    [place]

## 2021-06-07 NOTE — Patient Instructions (Signed)

## 2021-06-17 ENCOUNTER — Encounter: Payer: Self-pay | Admitting: Podiatry

## 2021-06-17 ENCOUNTER — Ambulatory Visit (INDEPENDENT_AMBULATORY_CARE_PROVIDER_SITE_OTHER): Payer: Medicare Other | Admitting: Podiatry

## 2021-06-17 ENCOUNTER — Other Ambulatory Visit: Payer: Self-pay

## 2021-06-17 DIAGNOSIS — L84 Corns and callosities: Secondary | ICD-10-CM

## 2021-06-17 DIAGNOSIS — M79674 Pain in right toe(s): Secondary | ICD-10-CM | POA: Diagnosis not present

## 2021-06-17 DIAGNOSIS — M79675 Pain in left toe(s): Secondary | ICD-10-CM | POA: Diagnosis not present

## 2021-06-17 DIAGNOSIS — B351 Tinea unguium: Secondary | ICD-10-CM | POA: Diagnosis not present

## 2021-06-17 DIAGNOSIS — D689 Coagulation defect, unspecified: Secondary | ICD-10-CM

## 2021-06-23 DIAGNOSIS — L300 Nummular dermatitis: Secondary | ICD-10-CM | POA: Diagnosis not present

## 2021-06-23 DIAGNOSIS — L219 Seborrheic dermatitis, unspecified: Secondary | ICD-10-CM | POA: Diagnosis not present

## 2021-06-24 NOTE — Progress Notes (Signed)
?  Subjective:  ?Patient ID: [name], female    DOB: [birth year],  MRN: [id number] ? ?[name] presents to clinic today for at risk foot care with h/o coagulation defect and callus(es) b/l lower extremities and painful thick toenails that are difficult to trim. Painful toenails interfere with ambulation. Aggravating factors include wearing enclosed shoe gear. Pain is relieved with periodic professional debridement. Painful calluses are aggravated when weightbearing with and without shoegear. Pain is relieved with periodic professional debridement. ? ?Patient's daughter is present during today's visit and daughter relates medical history. Patient has known h/o dementia and RLS b/l LE. ? ?New problem(s): None.  ? ?PCP is Gaynelle Arabian, MD , and last visit was May 21, 2021. ? ?Allergies  ?Allergen Reactions  ? Lipitor [Atorvastatin] Other (See Comments)  ?  "does not like to take"  ? Other Other (See Comments)  ? Prednisone Other (See Comments)  ?  Can't sleep  ? Limbitrol Ds  [Chlordiazepoxide-Amitriptyline] Rash  ? ? ?Review of Systems: Negative except as noted in the HPI. ? ?Objective: No changes noted in today's physical examination. ?Objective:  ?Vascular Examination: ?Vascular status intact b/l with palpable pedal pulses. Pedal hair present b/l. CFT immediate b/l. No edema. No pain with calf compression b/l. Skin temperature gradient WNL b/l.  ? ?Neurological Examination: ?Patient unable to follow commands of LE neurological examination due to cognitive deficits. Patient does respond to external noxious stimuli. ? ?Dermatological Examination: ?Pedal skin with normal turgor, texture and tone b/l. Toenails 1-5 b/l thick, discolored, elongated with subungual debris and pain on dorsal palpation. Hyperkeratotic lesion(s) submet head 1 right foot and 1st metatarsal head left foot.  No erythema, no edema, no drainage, no fluctuance. ? ?Musculoskeletal Examination: ?Muscle strength 5/5 to all LE  muscle groups of b/l lower extremities. HAV with bunion deformity noted b/l LE. ? ?Radiographs: None ? ?Assessment/Plan: ?1. Pain due to onychomycosis of toenails of both feet   ?2. Callus   ?3. Coagulation defect ([place])   ?  ?-Patient was evaluated and treated. All patient's and/or POA's questions/concerns answered on today's visit. ?-Mycotic toenails 1-5 bilaterally were debrided in length and girth with sterile nail nippers and dremel without incident. ?-Callus(es) submet head 1 right foot and 1st metatarsal head left foot pared utilizing sterile scalpel blade without complication or incident. Total number debrided =2. ?-Patient/POA to call should there be question/concern in the interim.  ? ?Return in about 3 months (around 09/17/2021). ? ?Marzetta Board, DPM  ?

## 2021-07-22 DIAGNOSIS — M48061 Spinal stenosis, lumbar region without neurogenic claudication: Secondary | ICD-10-CM | POA: Diagnosis not present

## 2021-07-22 DIAGNOSIS — M5459 Other low back pain: Secondary | ICD-10-CM | POA: Diagnosis not present

## 2021-07-26 DIAGNOSIS — H6123 Impacted cerumen, bilateral: Secondary | ICD-10-CM | POA: Diagnosis not present

## 2021-07-26 DIAGNOSIS — H61303 Acquired stenosis of external ear canal, unspecified, bilateral: Secondary | ICD-10-CM | POA: Diagnosis not present

## 2021-07-26 DIAGNOSIS — H9193 Unspecified hearing loss, bilateral: Secondary | ICD-10-CM | POA: Diagnosis not present

## 2021-07-30 DIAGNOSIS — M5459 Other low back pain: Secondary | ICD-10-CM | POA: Diagnosis not present

## 2021-08-05 DIAGNOSIS — M48061 Spinal stenosis, lumbar region without neurogenic claudication: Secondary | ICD-10-CM | POA: Diagnosis not present

## 2021-08-05 DIAGNOSIS — M5459 Other low back pain: Secondary | ICD-10-CM | POA: Diagnosis not present

## 2021-08-13 ENCOUNTER — Other Ambulatory Visit: Payer: Self-pay

## 2021-08-13 MED ORDER — AMIODARONE HCL 200 MG PO TABS: 200.0000 mg | ORAL_TABLET | Freq: Every day | ORAL | 1 refills | Status: DC

## 2021-08-26 DIAGNOSIS — M5416 Radiculopathy, lumbar region: Secondary | ICD-10-CM | POA: Diagnosis not present

## 2021-09-09 DIAGNOSIS — G319 Degenerative disease of nervous system, unspecified: Secondary | ICD-10-CM | POA: Diagnosis not present

## 2021-09-09 DIAGNOSIS — S199XXA Unspecified injury of neck, initial encounter: Secondary | ICD-10-CM | POA: Diagnosis not present

## 2021-09-09 DIAGNOSIS — M47812 Spondylosis without myelopathy or radiculopathy, cervical region: Secondary | ICD-10-CM | POA: Diagnosis not present

## 2021-09-09 DIAGNOSIS — I6529 Occlusion and stenosis of unspecified carotid artery: Secondary | ICD-10-CM | POA: Diagnosis not present

## 2021-09-09 DIAGNOSIS — I2510 Atherosclerotic heart disease of native coronary artery without angina pectoris: Secondary | ICD-10-CM | POA: Diagnosis not present

## 2021-09-09 DIAGNOSIS — S0003XA Contusion of scalp, initial encounter: Secondary | ICD-10-CM | POA: Diagnosis not present

## 2021-09-24 DIAGNOSIS — K219 Gastro-esophageal reflux disease without esophagitis: Secondary | ICD-10-CM | POA: Diagnosis not present

## 2021-09-24 DIAGNOSIS — R7303 Prediabetes: Secondary | ICD-10-CM | POA: Diagnosis not present

## 2021-09-24 DIAGNOSIS — F32A Depression, unspecified: Secondary | ICD-10-CM | POA: Diagnosis not present

## 2021-09-24 DIAGNOSIS — G2581 Restless legs syndrome: Secondary | ICD-10-CM | POA: Diagnosis not present

## 2021-09-24 DIAGNOSIS — M6259 Muscle wasting and atrophy, not elsewhere classified, multiple sites: Secondary | ICD-10-CM | POA: Diagnosis not present

## 2021-09-24 DIAGNOSIS — I2510 Atherosclerotic heart disease of native coronary artery without angina pectoris: Secondary | ICD-10-CM | POA: Diagnosis not present

## 2021-09-24 DIAGNOSIS — F03911 Unspecified dementia, unspecified severity, with agitation: Secondary | ICD-10-CM | POA: Diagnosis not present

## 2021-09-24 DIAGNOSIS — R296 Repeated falls: Secondary | ICD-10-CM | POA: Diagnosis not present

## 2021-09-24 DIAGNOSIS — I10 Essential (primary) hypertension: Secondary | ICD-10-CM | POA: Diagnosis not present

## 2021-09-24 DIAGNOSIS — I4891 Unspecified atrial fibrillation: Secondary | ICD-10-CM | POA: Diagnosis not present

## 2021-09-24 DIAGNOSIS — R278 Other lack of coordination: Secondary | ICD-10-CM | POA: Diagnosis not present

## 2021-09-24 DIAGNOSIS — Z8673 Personal history of transient ischemic attack (TIA), and cerebral infarction without residual deficits: Secondary | ICD-10-CM | POA: Diagnosis not present

## 2021-09-24 DIAGNOSIS — I482 Chronic atrial fibrillation, unspecified: Secondary | ICD-10-CM | POA: Diagnosis not present

## 2021-09-24 DIAGNOSIS — M4800 Spinal stenosis, site unspecified: Secondary | ICD-10-CM | POA: Diagnosis not present

## 2021-09-24 DIAGNOSIS — E785 Hyperlipidemia, unspecified: Secondary | ICD-10-CM | POA: Diagnosis not present

## 2021-09-27 DIAGNOSIS — I4891 Unspecified atrial fibrillation: Secondary | ICD-10-CM | POA: Diagnosis not present

## 2021-09-27 DIAGNOSIS — F03911 Unspecified dementia, unspecified severity, with agitation: Secondary | ICD-10-CM | POA: Diagnosis not present

## 2021-09-27 DIAGNOSIS — I10 Essential (primary) hypertension: Secondary | ICD-10-CM | POA: Diagnosis not present

## 2021-09-27 DIAGNOSIS — R296 Repeated falls: Secondary | ICD-10-CM | POA: Diagnosis not present

## 2021-09-28 DIAGNOSIS — I10 Essential (primary) hypertension: Secondary | ICD-10-CM | POA: Diagnosis not present

## 2021-09-28 DIAGNOSIS — E785 Hyperlipidemia, unspecified: Secondary | ICD-10-CM | POA: Diagnosis not present

## 2021-09-28 DIAGNOSIS — I482 Chronic atrial fibrillation, unspecified: Secondary | ICD-10-CM | POA: Diagnosis not present

## 2021-09-28 DIAGNOSIS — I2510 Atherosclerotic heart disease of native coronary artery without angina pectoris: Secondary | ICD-10-CM | POA: Diagnosis not present

## 2021-10-11 DIAGNOSIS — I10 Essential (primary) hypertension: Secondary | ICD-10-CM | POA: Diagnosis not present

## 2021-10-11 DIAGNOSIS — W19XXXA Unspecified fall, initial encounter: Secondary | ICD-10-CM | POA: Diagnosis not present

## 2021-10-12 DIAGNOSIS — M25552 Pain in left hip: Secondary | ICD-10-CM | POA: Diagnosis not present

## 2021-10-19 DIAGNOSIS — R0600 Dyspnea, unspecified: Secondary | ICD-10-CM | POA: Diagnosis not present

## 2021-10-26 DIAGNOSIS — K5730 Diverticulosis of large intestine without perforation or abscess without bleeding: Secondary | ICD-10-CM | POA: Diagnosis not present

## 2021-10-26 DIAGNOSIS — R935 Abnormal findings on diagnostic imaging of other abdominal regions, including retroperitoneum: Secondary | ICD-10-CM | POA: Diagnosis not present

## 2021-10-26 DIAGNOSIS — M25552 Pain in left hip: Secondary | ICD-10-CM | POA: Diagnosis not present

## 2021-10-28 DIAGNOSIS — I4891 Unspecified atrial fibrillation: Secondary | ICD-10-CM | POA: Diagnosis not present

## 2021-10-28 DIAGNOSIS — I10 Essential (primary) hypertension: Secondary | ICD-10-CM | POA: Diagnosis not present

## 2021-10-28 DIAGNOSIS — E785 Hyperlipidemia, unspecified: Secondary | ICD-10-CM | POA: Diagnosis not present

## 2021-10-28 DIAGNOSIS — R7303 Prediabetes: Secondary | ICD-10-CM | POA: Diagnosis not present

## 2021-11-16 DIAGNOSIS — M6259 Muscle wasting and atrophy, not elsewhere classified, multiple sites: Secondary | ICD-10-CM | POA: Diagnosis not present

## 2021-11-16 DIAGNOSIS — R278 Other lack of coordination: Secondary | ICD-10-CM | POA: Diagnosis not present

## 2021-11-16 DIAGNOSIS — M159 Polyosteoarthritis, unspecified: Secondary | ICD-10-CM | POA: Diagnosis not present

## 2021-11-16 DIAGNOSIS — F03911 Unspecified dementia, unspecified severity, with agitation: Secondary | ICD-10-CM | POA: Diagnosis not present

## 2021-11-16 DIAGNOSIS — W19XXXA Unspecified fall, initial encounter: Secondary | ICD-10-CM | POA: Diagnosis not present

## 2021-11-17 DIAGNOSIS — F03911 Unspecified dementia, unspecified severity, with agitation: Secondary | ICD-10-CM | POA: Diagnosis not present

## 2021-11-17 DIAGNOSIS — M6259 Muscle wasting and atrophy, not elsewhere classified, multiple sites: Secondary | ICD-10-CM | POA: Diagnosis not present

## 2021-11-17 DIAGNOSIS — R278 Other lack of coordination: Secondary | ICD-10-CM | POA: Diagnosis not present

## 2021-11-18 DIAGNOSIS — F03911 Unspecified dementia, unspecified severity, with agitation: Secondary | ICD-10-CM | POA: Diagnosis not present

## 2021-11-18 DIAGNOSIS — M6259 Muscle wasting and atrophy, not elsewhere classified, multiple sites: Secondary | ICD-10-CM | POA: Diagnosis not present

## 2021-11-18 DIAGNOSIS — R278 Other lack of coordination: Secondary | ICD-10-CM | POA: Diagnosis not present

## 2021-11-19 DIAGNOSIS — R278 Other lack of coordination: Secondary | ICD-10-CM | POA: Diagnosis not present

## 2021-11-19 DIAGNOSIS — F03911 Unspecified dementia, unspecified severity, with agitation: Secondary | ICD-10-CM | POA: Diagnosis not present

## 2021-11-19 DIAGNOSIS — M6259 Muscle wasting and atrophy, not elsewhere classified, multiple sites: Secondary | ICD-10-CM | POA: Diagnosis not present

## 2021-11-22 DIAGNOSIS — F03911 Unspecified dementia, unspecified severity, with agitation: Secondary | ICD-10-CM | POA: Diagnosis not present

## 2021-11-22 DIAGNOSIS — M6259 Muscle wasting and atrophy, not elsewhere classified, multiple sites: Secondary | ICD-10-CM | POA: Diagnosis not present

## 2021-11-22 DIAGNOSIS — R278 Other lack of coordination: Secondary | ICD-10-CM | POA: Diagnosis not present

## 2021-11-23 DIAGNOSIS — F03911 Unspecified dementia, unspecified severity, with agitation: Secondary | ICD-10-CM | POA: Diagnosis not present

## 2021-11-23 DIAGNOSIS — R278 Other lack of coordination: Secondary | ICD-10-CM | POA: Diagnosis not present

## 2021-11-23 DIAGNOSIS — M6259 Muscle wasting and atrophy, not elsewhere classified, multiple sites: Secondary | ICD-10-CM | POA: Diagnosis not present

## 2021-11-24 DIAGNOSIS — R278 Other lack of coordination: Secondary | ICD-10-CM | POA: Diagnosis not present

## 2021-11-24 DIAGNOSIS — M6259 Muscle wasting and atrophy, not elsewhere classified, multiple sites: Secondary | ICD-10-CM | POA: Diagnosis not present

## 2021-11-24 DIAGNOSIS — F03911 Unspecified dementia, unspecified severity, with agitation: Secondary | ICD-10-CM | POA: Diagnosis not present

## 2021-11-25 DIAGNOSIS — M6259 Muscle wasting and atrophy, not elsewhere classified, multiple sites: Secondary | ICD-10-CM | POA: Diagnosis not present

## 2021-11-25 DIAGNOSIS — F03911 Unspecified dementia, unspecified severity, with agitation: Secondary | ICD-10-CM | POA: Diagnosis not present

## 2021-11-25 DIAGNOSIS — R278 Other lack of coordination: Secondary | ICD-10-CM | POA: Diagnosis not present

## 2021-11-26 DIAGNOSIS — F03911 Unspecified dementia, unspecified severity, with agitation: Secondary | ICD-10-CM | POA: Diagnosis not present

## 2021-11-26 DIAGNOSIS — M6259 Muscle wasting and atrophy, not elsewhere classified, multiple sites: Secondary | ICD-10-CM | POA: Diagnosis not present

## 2021-11-26 DIAGNOSIS — R278 Other lack of coordination: Secondary | ICD-10-CM | POA: Diagnosis not present

## 2021-11-29 DIAGNOSIS — R278 Other lack of coordination: Secondary | ICD-10-CM | POA: Diagnosis not present

## 2021-11-29 DIAGNOSIS — F03911 Unspecified dementia, unspecified severity, with agitation: Secondary | ICD-10-CM | POA: Diagnosis not present

## 2021-11-29 DIAGNOSIS — M6259 Muscle wasting and atrophy, not elsewhere classified, multiple sites: Secondary | ICD-10-CM | POA: Diagnosis not present

## 2021-11-30 DIAGNOSIS — M6259 Muscle wasting and atrophy, not elsewhere classified, multiple sites: Secondary | ICD-10-CM | POA: Diagnosis not present

## 2021-11-30 DIAGNOSIS — F03911 Unspecified dementia, unspecified severity, with agitation: Secondary | ICD-10-CM | POA: Diagnosis not present

## 2021-11-30 DIAGNOSIS — R278 Other lack of coordination: Secondary | ICD-10-CM | POA: Diagnosis not present

## 2021-12-01 DIAGNOSIS — R278 Other lack of coordination: Secondary | ICD-10-CM | POA: Diagnosis not present

## 2021-12-01 DIAGNOSIS — M6259 Muscle wasting and atrophy, not elsewhere classified, multiple sites: Secondary | ICD-10-CM | POA: Diagnosis not present

## 2021-12-01 DIAGNOSIS — F03911 Unspecified dementia, unspecified severity, with agitation: Secondary | ICD-10-CM | POA: Diagnosis not present

## 2021-12-02 DIAGNOSIS — F03911 Unspecified dementia, unspecified severity, with agitation: Secondary | ICD-10-CM | POA: Diagnosis not present

## 2021-12-02 DIAGNOSIS — R278 Other lack of coordination: Secondary | ICD-10-CM | POA: Diagnosis not present

## 2021-12-02 DIAGNOSIS — I10 Essential (primary) hypertension: Secondary | ICD-10-CM | POA: Diagnosis not present

## 2021-12-02 DIAGNOSIS — I4891 Unspecified atrial fibrillation: Secondary | ICD-10-CM | POA: Diagnosis not present

## 2021-12-02 DIAGNOSIS — Z79899 Other long term (current) drug therapy: Secondary | ICD-10-CM | POA: Diagnosis not present

## 2021-12-02 DIAGNOSIS — M6259 Muscle wasting and atrophy, not elsewhere classified, multiple sites: Secondary | ICD-10-CM | POA: Diagnosis not present

## 2021-12-02 DIAGNOSIS — E785 Hyperlipidemia, unspecified: Secondary | ICD-10-CM | POA: Diagnosis not present

## 2021-12-04 DIAGNOSIS — F03911 Unspecified dementia, unspecified severity, with agitation: Secondary | ICD-10-CM | POA: Diagnosis not present

## 2021-12-04 DIAGNOSIS — R278 Other lack of coordination: Secondary | ICD-10-CM | POA: Diagnosis not present

## 2021-12-04 DIAGNOSIS — M6259 Muscle wasting and atrophy, not elsewhere classified, multiple sites: Secondary | ICD-10-CM | POA: Diagnosis not present

## 2021-12-07 DIAGNOSIS — M6259 Muscle wasting and atrophy, not elsewhere classified, multiple sites: Secondary | ICD-10-CM | POA: Diagnosis not present

## 2021-12-07 DIAGNOSIS — F03911 Unspecified dementia, unspecified severity, with agitation: Secondary | ICD-10-CM | POA: Diagnosis not present

## 2021-12-07 DIAGNOSIS — R278 Other lack of coordination: Secondary | ICD-10-CM | POA: Diagnosis not present

## 2021-12-08 DIAGNOSIS — M6259 Muscle wasting and atrophy, not elsewhere classified, multiple sites: Secondary | ICD-10-CM | POA: Diagnosis not present

## 2021-12-08 DIAGNOSIS — R278 Other lack of coordination: Secondary | ICD-10-CM | POA: Diagnosis not present

## 2021-12-08 DIAGNOSIS — F03911 Unspecified dementia, unspecified severity, with agitation: Secondary | ICD-10-CM | POA: Diagnosis not present

## 2021-12-09 DIAGNOSIS — M159 Polyosteoarthritis, unspecified: Secondary | ICD-10-CM | POA: Diagnosis not present

## 2021-12-09 DIAGNOSIS — F03911 Unspecified dementia, unspecified severity, with agitation: Secondary | ICD-10-CM | POA: Diagnosis not present

## 2021-12-09 DIAGNOSIS — M6259 Muscle wasting and atrophy, not elsewhere classified, multiple sites: Secondary | ICD-10-CM | POA: Diagnosis not present

## 2021-12-09 DIAGNOSIS — R278 Other lack of coordination: Secondary | ICD-10-CM | POA: Diagnosis not present

## 2021-12-10 DIAGNOSIS — Z79899 Other long term (current) drug therapy: Secondary | ICD-10-CM | POA: Diagnosis not present

## 2021-12-10 DIAGNOSIS — F03911 Unspecified dementia, unspecified severity, with agitation: Secondary | ICD-10-CM | POA: Diagnosis not present

## 2021-12-10 DIAGNOSIS — M6259 Muscle wasting and atrophy, not elsewhere classified, multiple sites: Secondary | ICD-10-CM | POA: Diagnosis not present

## 2021-12-10 DIAGNOSIS — R278 Other lack of coordination: Secondary | ICD-10-CM | POA: Diagnosis not present

## 2021-12-10 DIAGNOSIS — N390 Urinary tract infection, site not specified: Secondary | ICD-10-CM | POA: Diagnosis not present

## 2021-12-11 DIAGNOSIS — M6259 Muscle wasting and atrophy, not elsewhere classified, multiple sites: Secondary | ICD-10-CM | POA: Diagnosis not present

## 2021-12-11 DIAGNOSIS — R278 Other lack of coordination: Secondary | ICD-10-CM | POA: Diagnosis not present

## 2021-12-11 DIAGNOSIS — F03911 Unspecified dementia, unspecified severity, with agitation: Secondary | ICD-10-CM | POA: Diagnosis not present

## 2021-12-13 DIAGNOSIS — M6259 Muscle wasting and atrophy, not elsewhere classified, multiple sites: Secondary | ICD-10-CM | POA: Diagnosis not present

## 2021-12-13 DIAGNOSIS — F03911 Unspecified dementia, unspecified severity, with agitation: Secondary | ICD-10-CM | POA: Diagnosis not present

## 2021-12-13 DIAGNOSIS — R278 Other lack of coordination: Secondary | ICD-10-CM | POA: Diagnosis not present

## 2021-12-14 DIAGNOSIS — F03911 Unspecified dementia, unspecified severity, with agitation: Secondary | ICD-10-CM | POA: Diagnosis not present

## 2021-12-14 DIAGNOSIS — R278 Other lack of coordination: Secondary | ICD-10-CM | POA: Diagnosis not present

## 2021-12-14 DIAGNOSIS — M6259 Muscle wasting and atrophy, not elsewhere classified, multiple sites: Secondary | ICD-10-CM | POA: Diagnosis not present

## 2021-12-15 DIAGNOSIS — R278 Other lack of coordination: Secondary | ICD-10-CM | POA: Diagnosis not present

## 2021-12-15 DIAGNOSIS — M6259 Muscle wasting and atrophy, not elsewhere classified, multiple sites: Secondary | ICD-10-CM | POA: Diagnosis not present

## 2021-12-15 DIAGNOSIS — F03911 Unspecified dementia, unspecified severity, with agitation: Secondary | ICD-10-CM | POA: Diagnosis not present

## 2021-12-16 DIAGNOSIS — R278 Other lack of coordination: Secondary | ICD-10-CM | POA: Diagnosis not present

## 2021-12-16 DIAGNOSIS — F03911 Unspecified dementia, unspecified severity, with agitation: Secondary | ICD-10-CM | POA: Diagnosis not present

## 2021-12-16 DIAGNOSIS — M6259 Muscle wasting and atrophy, not elsewhere classified, multiple sites: Secondary | ICD-10-CM | POA: Diagnosis not present

## 2021-12-17 DIAGNOSIS — R278 Other lack of coordination: Secondary | ICD-10-CM | POA: Diagnosis not present

## 2021-12-17 DIAGNOSIS — F03911 Unspecified dementia, unspecified severity, with agitation: Secondary | ICD-10-CM | POA: Diagnosis not present

## 2021-12-17 DIAGNOSIS — M6259 Muscle wasting and atrophy, not elsewhere classified, multiple sites: Secondary | ICD-10-CM | POA: Diagnosis not present

## 2021-12-18 DIAGNOSIS — N390 Urinary tract infection, site not specified: Secondary | ICD-10-CM | POA: Diagnosis not present

## 2021-12-19 DIAGNOSIS — R278 Other lack of coordination: Secondary | ICD-10-CM | POA: Diagnosis not present

## 2021-12-19 DIAGNOSIS — F03911 Unspecified dementia, unspecified severity, with agitation: Secondary | ICD-10-CM | POA: Diagnosis not present

## 2021-12-19 DIAGNOSIS — M6259 Muscle wasting and atrophy, not elsewhere classified, multiple sites: Secondary | ICD-10-CM | POA: Diagnosis not present

## 2021-12-20 DIAGNOSIS — N390 Urinary tract infection, site not specified: Secondary | ICD-10-CM | POA: Diagnosis not present

## 2021-12-20 DIAGNOSIS — M6259 Muscle wasting and atrophy, not elsewhere classified, multiple sites: Secondary | ICD-10-CM | POA: Diagnosis not present

## 2021-12-20 DIAGNOSIS — R278 Other lack of coordination: Secondary | ICD-10-CM | POA: Diagnosis not present

## 2021-12-20 DIAGNOSIS — F03911 Unspecified dementia, unspecified severity, with agitation: Secondary | ICD-10-CM | POA: Diagnosis not present

## 2021-12-21 DIAGNOSIS — F03911 Unspecified dementia, unspecified severity, with agitation: Secondary | ICD-10-CM | POA: Diagnosis not present

## 2021-12-21 DIAGNOSIS — M6259 Muscle wasting and atrophy, not elsewhere classified, multiple sites: Secondary | ICD-10-CM | POA: Diagnosis not present

## 2021-12-21 DIAGNOSIS — R278 Other lack of coordination: Secondary | ICD-10-CM | POA: Diagnosis not present

## 2021-12-22 DIAGNOSIS — R278 Other lack of coordination: Secondary | ICD-10-CM | POA: Diagnosis not present

## 2021-12-22 DIAGNOSIS — M159 Polyosteoarthritis, unspecified: Secondary | ICD-10-CM | POA: Diagnosis not present

## 2021-12-22 DIAGNOSIS — F03911 Unspecified dementia, unspecified severity, with agitation: Secondary | ICD-10-CM | POA: Diagnosis not present

## 2021-12-22 DIAGNOSIS — M6259 Muscle wasting and atrophy, not elsewhere classified, multiple sites: Secondary | ICD-10-CM | POA: Diagnosis not present

## 2021-12-23 DIAGNOSIS — R278 Other lack of coordination: Secondary | ICD-10-CM | POA: Diagnosis not present

## 2021-12-23 DIAGNOSIS — E785 Hyperlipidemia, unspecified: Secondary | ICD-10-CM | POA: Diagnosis not present

## 2021-12-23 DIAGNOSIS — M6259 Muscle wasting and atrophy, not elsewhere classified, multiple sites: Secondary | ICD-10-CM | POA: Diagnosis not present

## 2021-12-23 DIAGNOSIS — I10 Essential (primary) hypertension: Secondary | ICD-10-CM | POA: Diagnosis not present

## 2021-12-23 DIAGNOSIS — F03911 Unspecified dementia, unspecified severity, with agitation: Secondary | ICD-10-CM | POA: Diagnosis not present

## 2021-12-23 DIAGNOSIS — I4891 Unspecified atrial fibrillation: Secondary | ICD-10-CM | POA: Diagnosis not present

## 2021-12-23 DIAGNOSIS — E559 Vitamin D deficiency, unspecified: Secondary | ICD-10-CM | POA: Diagnosis not present

## 2021-12-24 DIAGNOSIS — R278 Other lack of coordination: Secondary | ICD-10-CM | POA: Diagnosis not present

## 2021-12-24 DIAGNOSIS — F03911 Unspecified dementia, unspecified severity, with agitation: Secondary | ICD-10-CM | POA: Diagnosis not present

## 2021-12-24 DIAGNOSIS — E559 Vitamin D deficiency, unspecified: Secondary | ICD-10-CM | POA: Diagnosis not present

## 2021-12-24 DIAGNOSIS — M6259 Muscle wasting and atrophy, not elsewhere classified, multiple sites: Secondary | ICD-10-CM | POA: Diagnosis not present

## 2021-12-25 DIAGNOSIS — F03911 Unspecified dementia, unspecified severity, with agitation: Secondary | ICD-10-CM | POA: Diagnosis not present

## 2021-12-25 DIAGNOSIS — R278 Other lack of coordination: Secondary | ICD-10-CM | POA: Diagnosis not present

## 2021-12-25 DIAGNOSIS — M6259 Muscle wasting and atrophy, not elsewhere classified, multiple sites: Secondary | ICD-10-CM | POA: Diagnosis not present

## 2021-12-27 DIAGNOSIS — F03911 Unspecified dementia, unspecified severity, with agitation: Secondary | ICD-10-CM | POA: Diagnosis not present

## 2021-12-27 DIAGNOSIS — M6259 Muscle wasting and atrophy, not elsewhere classified, multiple sites: Secondary | ICD-10-CM | POA: Diagnosis not present

## 2021-12-27 DIAGNOSIS — E785 Hyperlipidemia, unspecified: Secondary | ICD-10-CM | POA: Diagnosis not present

## 2021-12-27 DIAGNOSIS — R262 Difficulty in walking, not elsewhere classified: Secondary | ICD-10-CM | POA: Diagnosis not present

## 2021-12-27 DIAGNOSIS — R278 Other lack of coordination: Secondary | ICD-10-CM | POA: Diagnosis not present

## 2021-12-28 DIAGNOSIS — M6259 Muscle wasting and atrophy, not elsewhere classified, multiple sites: Secondary | ICD-10-CM | POA: Diagnosis not present

## 2021-12-28 DIAGNOSIS — F03911 Unspecified dementia, unspecified severity, with agitation: Secondary | ICD-10-CM | POA: Diagnosis not present

## 2021-12-28 DIAGNOSIS — R262 Difficulty in walking, not elsewhere classified: Secondary | ICD-10-CM | POA: Diagnosis not present

## 2021-12-28 DIAGNOSIS — R278 Other lack of coordination: Secondary | ICD-10-CM | POA: Diagnosis not present

## 2021-12-29 DIAGNOSIS — M6259 Muscle wasting and atrophy, not elsewhere classified, multiple sites: Secondary | ICD-10-CM | POA: Diagnosis not present

## 2021-12-29 DIAGNOSIS — R262 Difficulty in walking, not elsewhere classified: Secondary | ICD-10-CM | POA: Diagnosis not present

## 2021-12-29 DIAGNOSIS — R278 Other lack of coordination: Secondary | ICD-10-CM | POA: Diagnosis not present

## 2021-12-29 DIAGNOSIS — F03911 Unspecified dementia, unspecified severity, with agitation: Secondary | ICD-10-CM | POA: Diagnosis not present

## 2021-12-30 DIAGNOSIS — R262 Difficulty in walking, not elsewhere classified: Secondary | ICD-10-CM | POA: Diagnosis not present

## 2021-12-30 DIAGNOSIS — R278 Other lack of coordination: Secondary | ICD-10-CM | POA: Diagnosis not present

## 2021-12-30 DIAGNOSIS — M6259 Muscle wasting and atrophy, not elsewhere classified, multiple sites: Secondary | ICD-10-CM | POA: Diagnosis not present

## 2021-12-30 DIAGNOSIS — F03911 Unspecified dementia, unspecified severity, with agitation: Secondary | ICD-10-CM | POA: Diagnosis not present

## 2021-12-31 DIAGNOSIS — F03911 Unspecified dementia, unspecified severity, with agitation: Secondary | ICD-10-CM | POA: Diagnosis not present

## 2021-12-31 DIAGNOSIS — R262 Difficulty in walking, not elsewhere classified: Secondary | ICD-10-CM | POA: Diagnosis not present

## 2021-12-31 DIAGNOSIS — M6259 Muscle wasting and atrophy, not elsewhere classified, multiple sites: Secondary | ICD-10-CM | POA: Diagnosis not present

## 2021-12-31 DIAGNOSIS — R278 Other lack of coordination: Secondary | ICD-10-CM | POA: Diagnosis not present

## 2021-12-31 DIAGNOSIS — E559 Vitamin D deficiency, unspecified: Secondary | ICD-10-CM | POA: Diagnosis not present

## 2022-01-02 IMAGING — CR DG CHEST 2V
1 series · 1 of 1 positions shown · non-contrast
Comparison: Chest radiograph dated 02/02/2020

CLINICAL DATA: 74-year-old female with shortness of breath.

EXAM:
CHEST - 2 VIEW

[w chest pa]
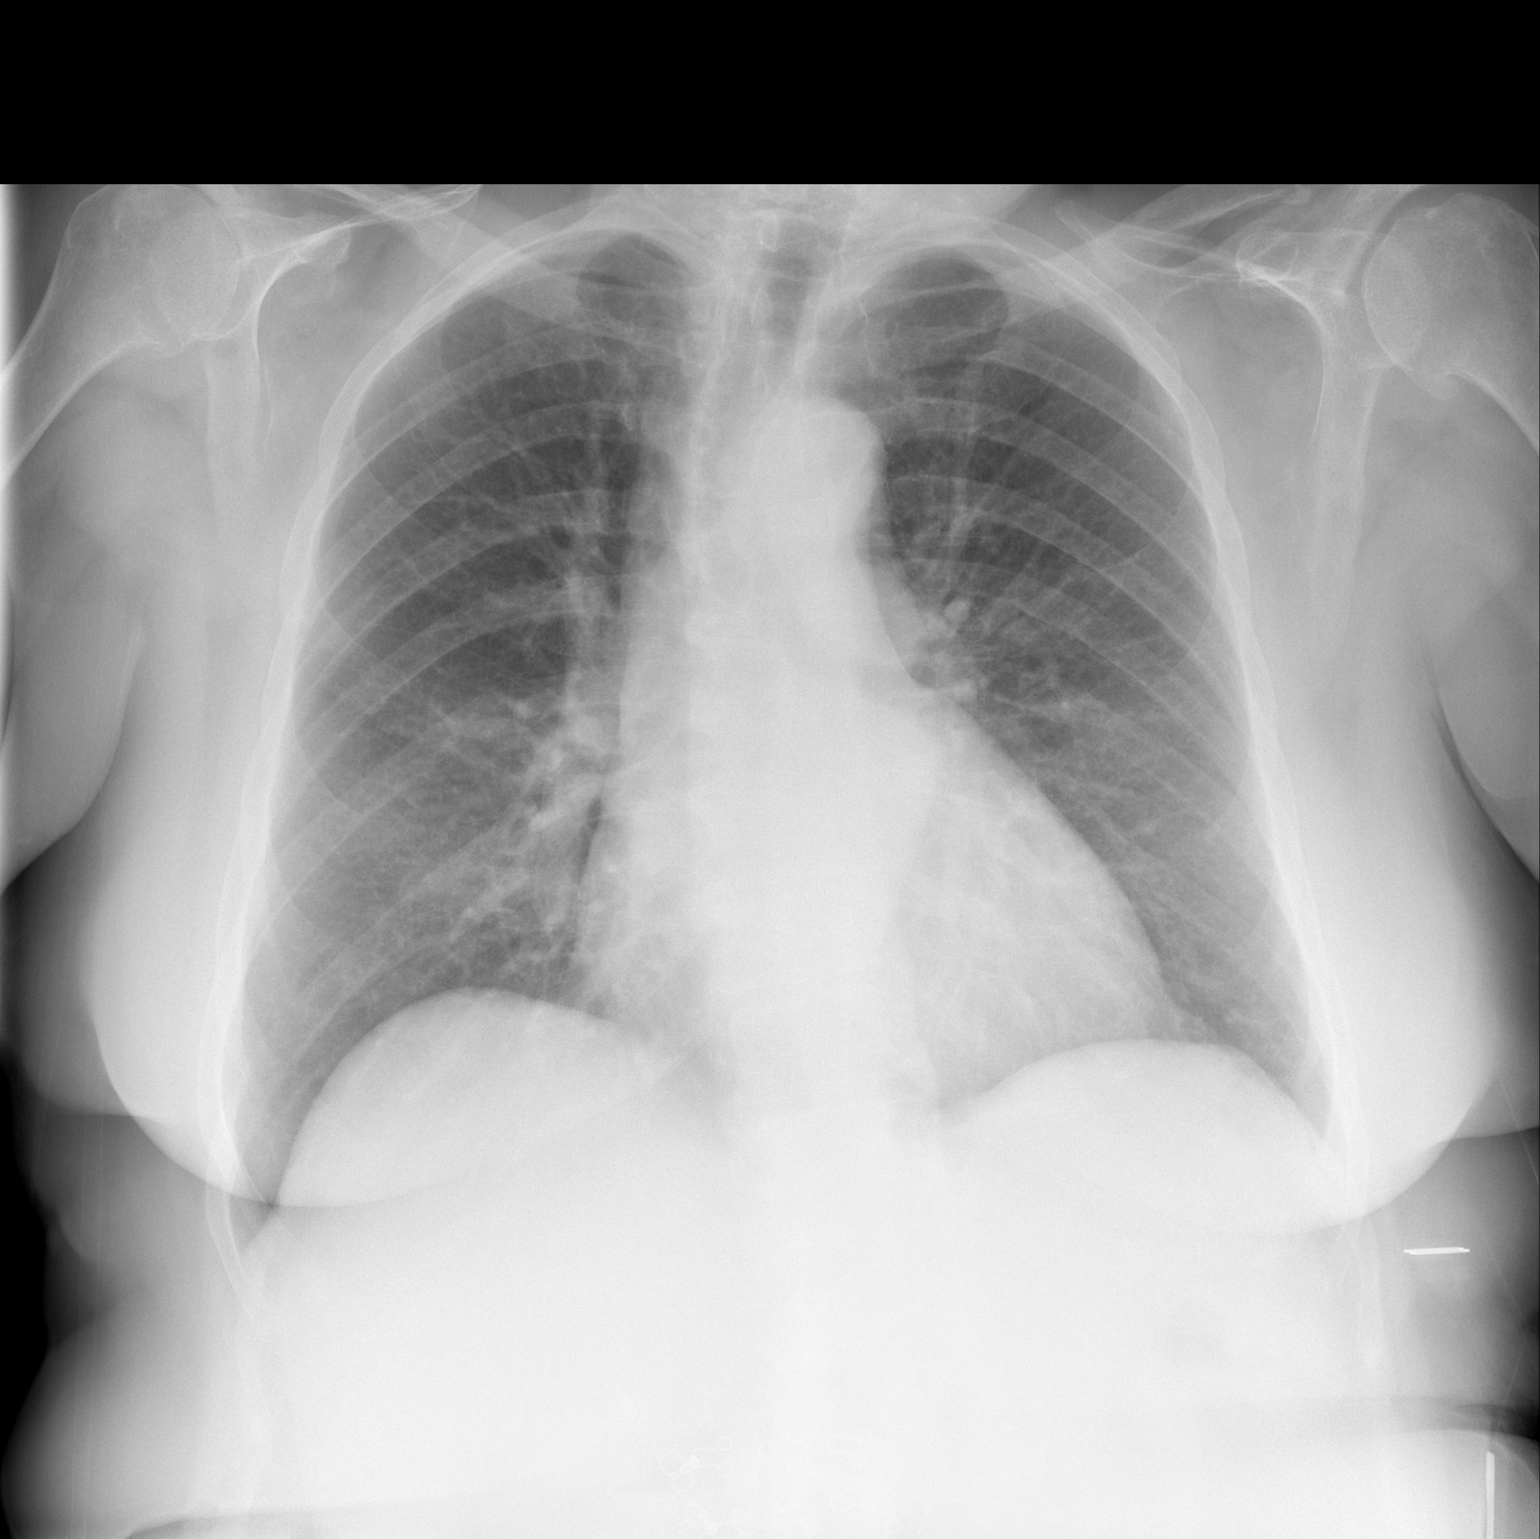

[1 of 1 positions shown; findings below may reference images not displayed]

FINDINGS: No focal consolidation, pleural effusion, or pneumothorax.
Borderline cardiomegaly. No acute osseous pathology.
IMPRESSION: No active cardiopulmonary disease.

## 2022-01-03 DIAGNOSIS — R278 Other lack of coordination: Secondary | ICD-10-CM | POA: Diagnosis not present

## 2022-01-03 DIAGNOSIS — R262 Difficulty in walking, not elsewhere classified: Secondary | ICD-10-CM | POA: Diagnosis not present

## 2022-01-03 DIAGNOSIS — F03911 Unspecified dementia, unspecified severity, with agitation: Secondary | ICD-10-CM | POA: Diagnosis not present

## 2022-01-03 DIAGNOSIS — R300 Dysuria: Secondary | ICD-10-CM | POA: Diagnosis not present

## 2022-01-03 DIAGNOSIS — N390 Urinary tract infection, site not specified: Secondary | ICD-10-CM | POA: Diagnosis not present

## 2022-01-03 DIAGNOSIS — M6259 Muscle wasting and atrophy, not elsewhere classified, multiple sites: Secondary | ICD-10-CM | POA: Diagnosis not present

## 2022-01-04 DIAGNOSIS — F02818 Dementia in other diseases classified elsewhere, unspecified severity, with other behavioral disturbance: Secondary | ICD-10-CM | POA: Diagnosis not present

## 2022-01-04 DIAGNOSIS — M6259 Muscle wasting and atrophy, not elsewhere classified, multiple sites: Secondary | ICD-10-CM | POA: Diagnosis not present

## 2022-01-04 DIAGNOSIS — F03911 Unspecified dementia, unspecified severity, with agitation: Secondary | ICD-10-CM | POA: Diagnosis not present

## 2022-01-04 DIAGNOSIS — R278 Other lack of coordination: Secondary | ICD-10-CM | POA: Diagnosis not present

## 2022-01-04 DIAGNOSIS — R21 Rash and other nonspecific skin eruption: Secondary | ICD-10-CM | POA: Diagnosis not present

## 2022-01-04 DIAGNOSIS — R262 Difficulty in walking, not elsewhere classified: Secondary | ICD-10-CM | POA: Diagnosis not present

## 2022-01-05 DIAGNOSIS — F03911 Unspecified dementia, unspecified severity, with agitation: Secondary | ICD-10-CM | POA: Diagnosis not present

## 2022-01-05 DIAGNOSIS — M6259 Muscle wasting and atrophy, not elsewhere classified, multiple sites: Secondary | ICD-10-CM | POA: Diagnosis not present

## 2022-01-05 DIAGNOSIS — R262 Difficulty in walking, not elsewhere classified: Secondary | ICD-10-CM | POA: Diagnosis not present

## 2022-01-05 DIAGNOSIS — E559 Vitamin D deficiency, unspecified: Secondary | ICD-10-CM | POA: Diagnosis not present

## 2022-01-05 DIAGNOSIS — R278 Other lack of coordination: Secondary | ICD-10-CM | POA: Diagnosis not present

## 2022-01-06 DIAGNOSIS — M6259 Muscle wasting and atrophy, not elsewhere classified, multiple sites: Secondary | ICD-10-CM | POA: Diagnosis not present

## 2022-01-06 DIAGNOSIS — R21 Rash and other nonspecific skin eruption: Secondary | ICD-10-CM | POA: Diagnosis not present

## 2022-01-06 DIAGNOSIS — E559 Vitamin D deficiency, unspecified: Secondary | ICD-10-CM | POA: Diagnosis not present

## 2022-01-06 DIAGNOSIS — R278 Other lack of coordination: Secondary | ICD-10-CM | POA: Diagnosis not present

## 2022-01-06 DIAGNOSIS — R262 Difficulty in walking, not elsewhere classified: Secondary | ICD-10-CM | POA: Diagnosis not present

## 2022-01-06 DIAGNOSIS — F0393 Unspecified dementia, unspecified severity, with mood disturbance: Secondary | ICD-10-CM | POA: Diagnosis not present

## 2022-01-06 DIAGNOSIS — G4700 Insomnia, unspecified: Secondary | ICD-10-CM | POA: Diagnosis not present

## 2022-01-06 DIAGNOSIS — F03911 Unspecified dementia, unspecified severity, with agitation: Secondary | ICD-10-CM | POA: Diagnosis not present

## 2022-01-07 DIAGNOSIS — R278 Other lack of coordination: Secondary | ICD-10-CM | POA: Diagnosis not present

## 2022-01-07 DIAGNOSIS — F03911 Unspecified dementia, unspecified severity, with agitation: Secondary | ICD-10-CM | POA: Diagnosis not present

## 2022-01-07 DIAGNOSIS — R262 Difficulty in walking, not elsewhere classified: Secondary | ICD-10-CM | POA: Diagnosis not present

## 2022-01-07 DIAGNOSIS — M6259 Muscle wasting and atrophy, not elsewhere classified, multiple sites: Secondary | ICD-10-CM | POA: Diagnosis not present

## 2022-01-10 DIAGNOSIS — R278 Other lack of coordination: Secondary | ICD-10-CM | POA: Diagnosis not present

## 2022-01-10 DIAGNOSIS — M6259 Muscle wasting and atrophy, not elsewhere classified, multiple sites: Secondary | ICD-10-CM | POA: Diagnosis not present

## 2022-01-10 DIAGNOSIS — R262 Difficulty in walking, not elsewhere classified: Secondary | ICD-10-CM | POA: Diagnosis not present

## 2022-01-10 DIAGNOSIS — F03911 Unspecified dementia, unspecified severity, with agitation: Secondary | ICD-10-CM | POA: Diagnosis not present

## 2022-01-11 IMAGING — US US ABDOMEN LIMITED
1 series · 14 of 25 positions shown · non-contrast
Comparison: Abdominal ultrasound 01/09/2020

CLINICAL DATA: Elevated liver enzymes

EXAM:
ULTRASOUND ABDOMEN LIMITED RIGHT UPPER QUADRANT

[Series 1: us abdomen limited · 0.28mm/px · 14 of 43 slices shown]
[im 1/43]
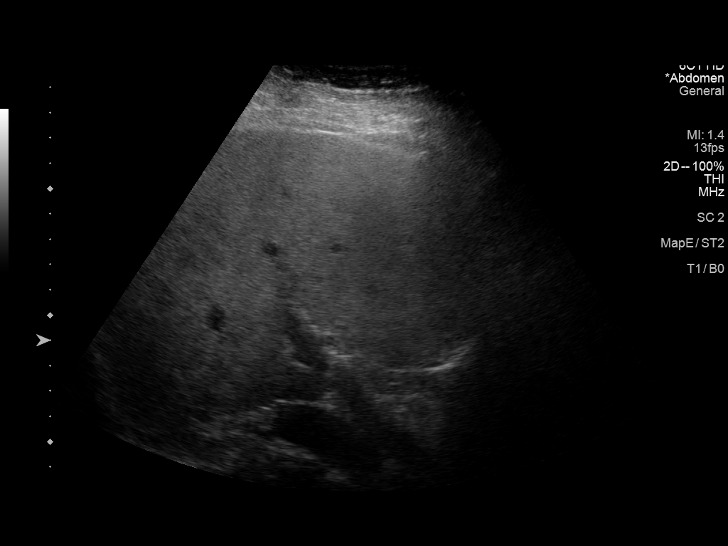
[im 4/43]
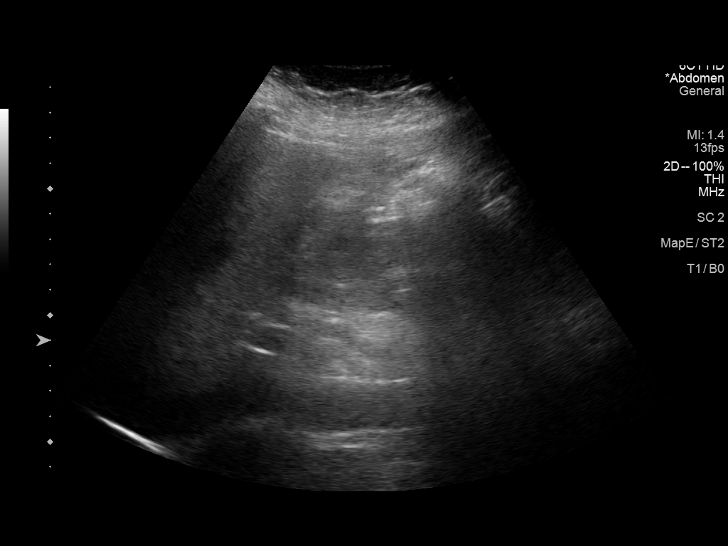
[im 8/43]
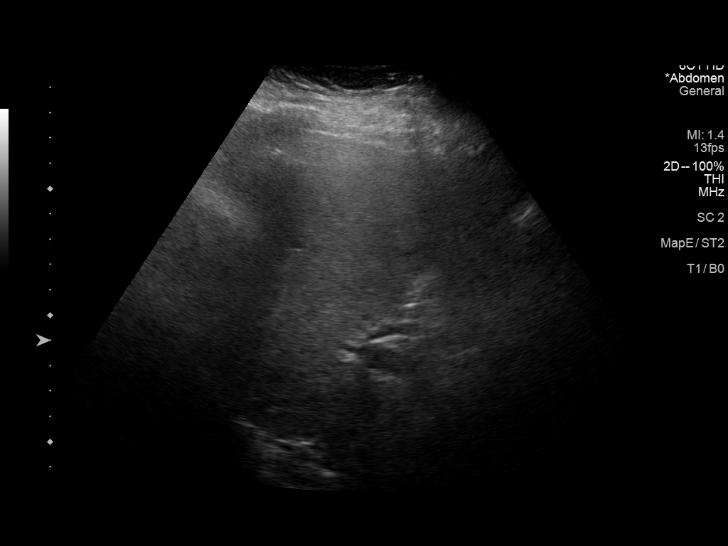
[im 11/43]
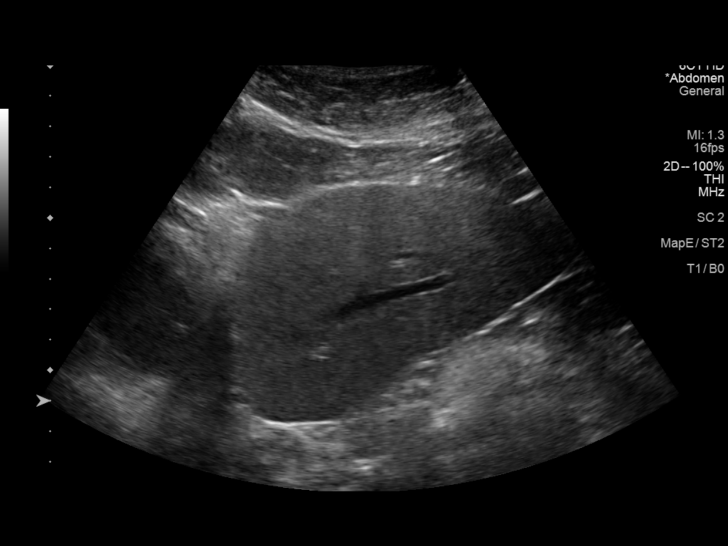
[im 15/43]
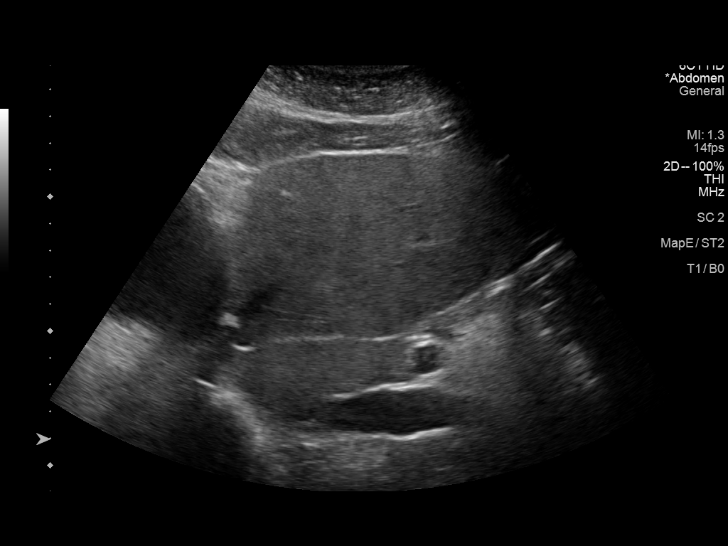
[im 16/43]
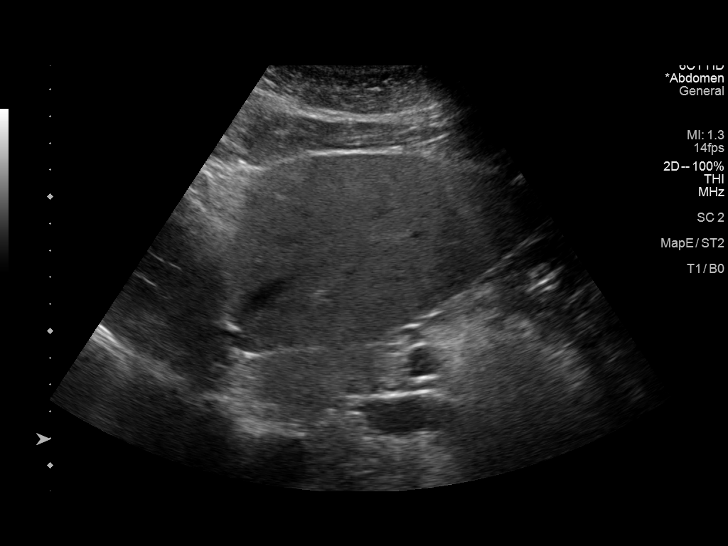
[im 20/43]
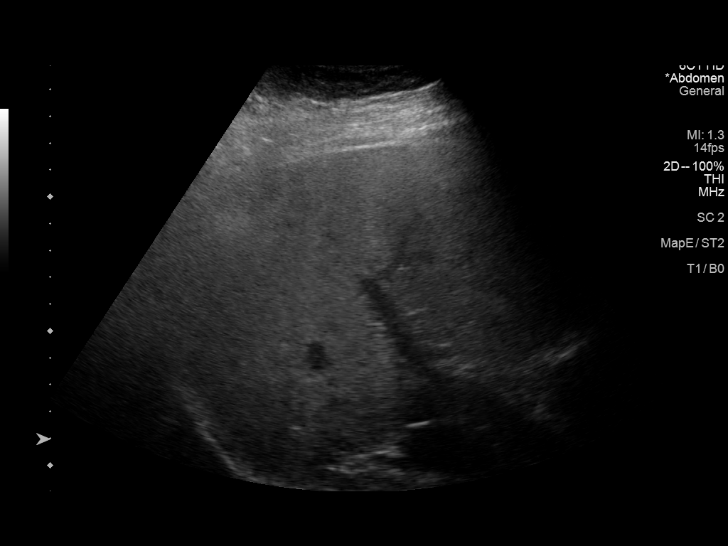
[im 23/43]
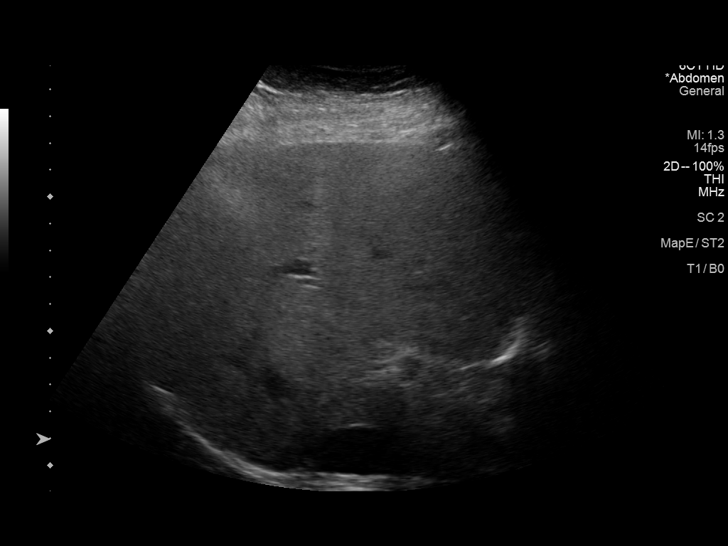
[im 27/43]
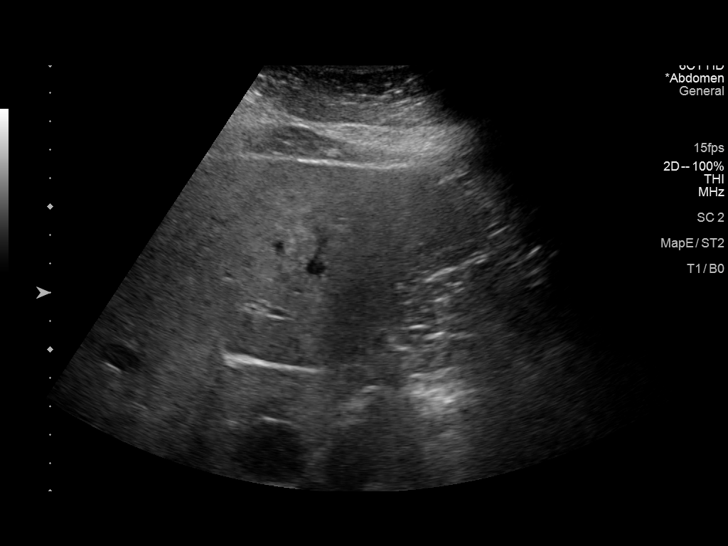
[im 29/43]
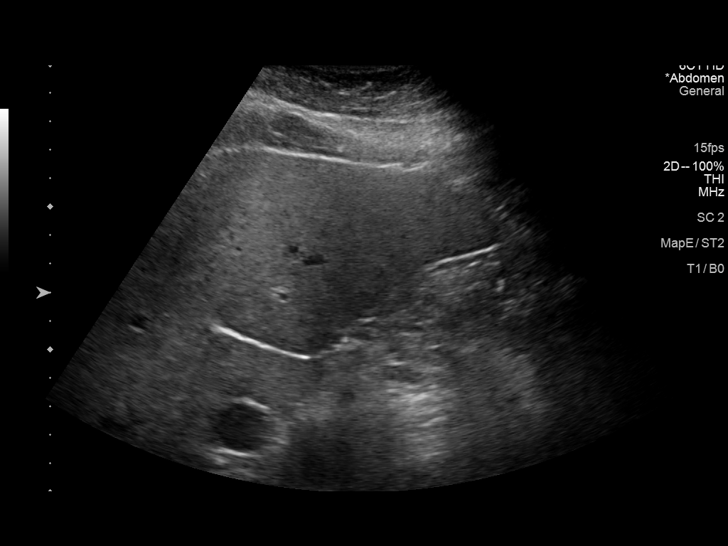
[im 32/43]
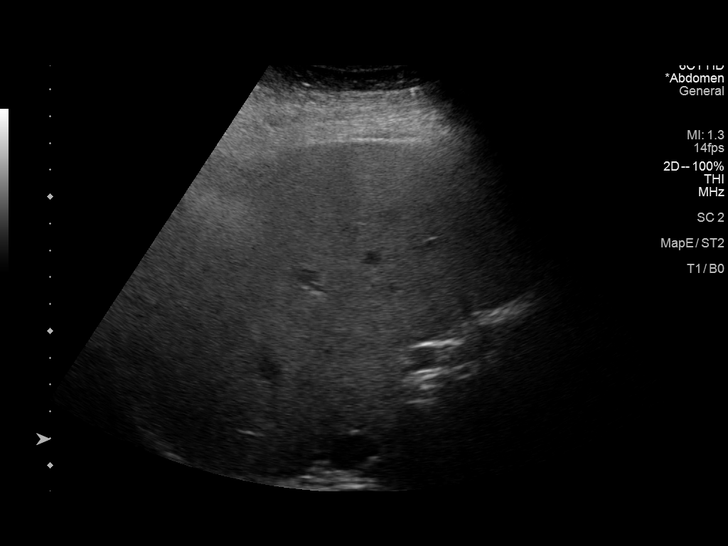
[im 36/43]
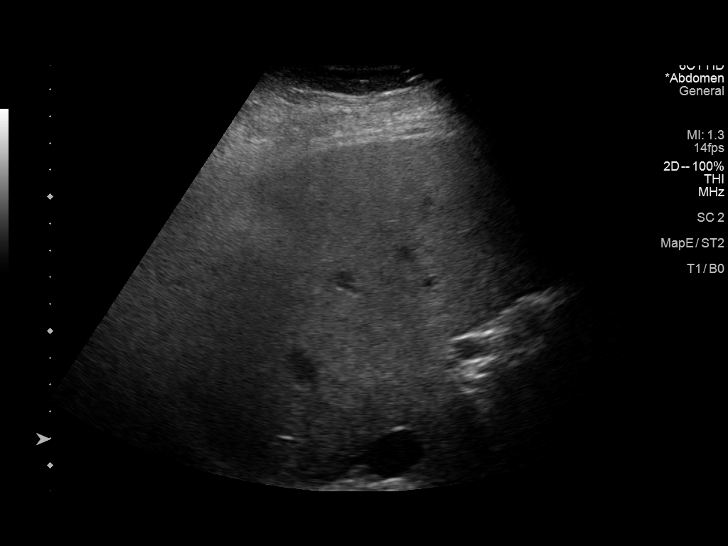
[im 39/43]
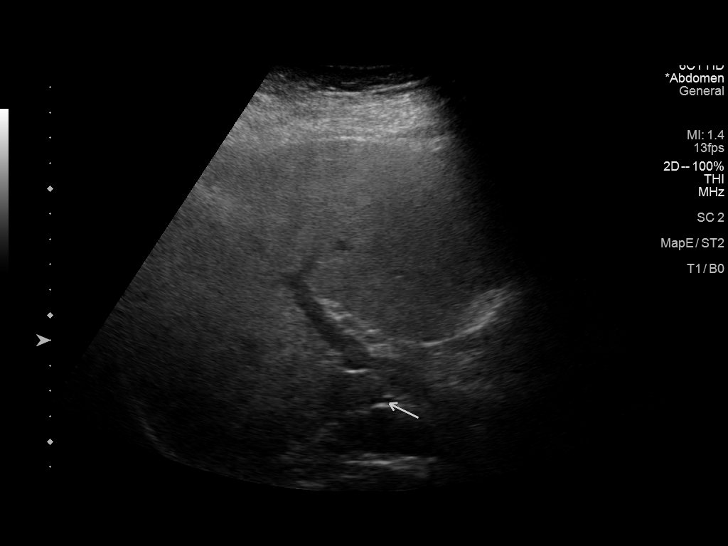
[im 43/43]
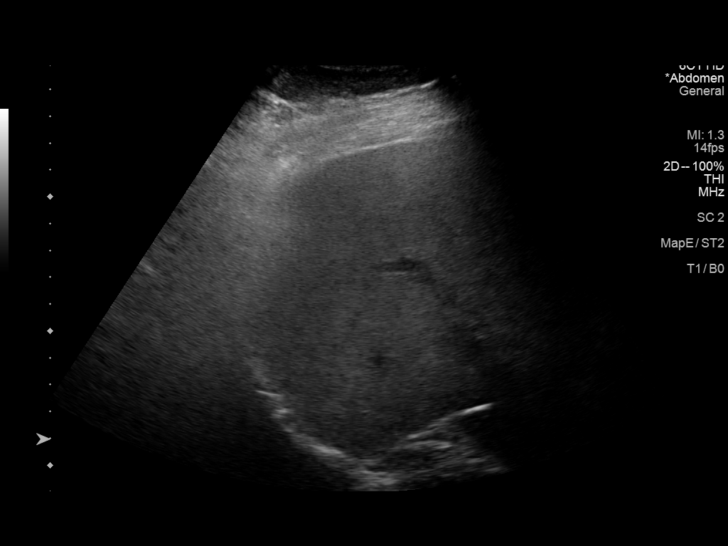

[14 of 25 positions shown; findings below may reference images not displayed]

FINDINGS: Gallbladder:

Surgically absent.

Common bile duct:

Diameter: 0.6 cm, within normal limits

Liver:

There is nonspecific heterogeneity of the caudate lobe. No
additional focal lesion identified. Parenchymal echogenicity is
diffusely increased. Portal vein is patent on color Doppler imaging
with normal direction of blood flow towards the liver.

Other: None.
IMPRESSION: 1. Mild diffuse increase in echogenicity of the liver which is
nonspecific but most commonly seen with hepatic steatosis.

2. Nonspecific heterogeneity of the caudate lobe. May consider liver
MRI for further evaluation or six-month follow-up ultrasound.

## 2022-01-12 DIAGNOSIS — Z79899 Other long term (current) drug therapy: Secondary | ICD-10-CM | POA: Diagnosis not present

## 2022-01-13 DIAGNOSIS — F02818 Dementia in other diseases classified elsewhere, unspecified severity, with other behavioral disturbance: Secondary | ICD-10-CM | POA: Diagnosis not present

## 2022-01-13 DIAGNOSIS — R21 Rash and other nonspecific skin eruption: Secondary | ICD-10-CM | POA: Diagnosis not present

## 2022-01-13 DIAGNOSIS — M169 Osteoarthritis of hip, unspecified: Secondary | ICD-10-CM | POA: Diagnosis not present

## 2022-01-14 DIAGNOSIS — F02818 Dementia in other diseases classified elsewhere, unspecified severity, with other behavioral disturbance: Secondary | ICD-10-CM | POA: Diagnosis not present

## 2022-01-14 DIAGNOSIS — W19XXXA Unspecified fall, initial encounter: Secondary | ICD-10-CM | POA: Diagnosis not present

## 2022-01-17 DIAGNOSIS — F02818 Dementia in other diseases classified elsewhere, unspecified severity, with other behavioral disturbance: Secondary | ICD-10-CM | POA: Diagnosis not present

## 2022-01-17 DIAGNOSIS — W19XXXA Unspecified fall, initial encounter: Secondary | ICD-10-CM | POA: Diagnosis not present

## 2022-01-18 DIAGNOSIS — R079 Chest pain, unspecified: Secondary | ICD-10-CM | POA: Diagnosis not present

## 2022-01-18 DIAGNOSIS — N390 Urinary tract infection, site not specified: Secondary | ICD-10-CM | POA: Diagnosis not present

## 2022-01-19 DIAGNOSIS — I10 Essential (primary) hypertension: Secondary | ICD-10-CM | POA: Diagnosis not present

## 2022-01-19 DIAGNOSIS — G319 Degenerative disease of nervous system, unspecified: Secondary | ICD-10-CM | POA: Diagnosis not present

## 2022-01-19 DIAGNOSIS — N390 Urinary tract infection, site not specified: Secondary | ICD-10-CM | POA: Diagnosis not present

## 2022-01-19 DIAGNOSIS — I771 Stricture of artery: Secondary | ICD-10-CM | POA: Diagnosis not present

## 2022-01-19 DIAGNOSIS — W19XXXA Unspecified fall, initial encounter: Secondary | ICD-10-CM | POA: Diagnosis not present

## 2022-01-19 DIAGNOSIS — M7989 Other specified soft tissue disorders: Secondary | ICD-10-CM | POA: Diagnosis not present

## 2022-01-19 DIAGNOSIS — I214 Non-ST elevation (NSTEMI) myocardial infarction: Secondary | ICD-10-CM | POA: Diagnosis not present

## 2022-01-19 DIAGNOSIS — M47812 Spondylosis without myelopathy or radiculopathy, cervical region: Secondary | ICD-10-CM | POA: Diagnosis not present

## 2022-01-19 DIAGNOSIS — D72829 Elevated white blood cell count, unspecified: Secondary | ICD-10-CM | POA: Diagnosis not present

## 2022-01-19 DIAGNOSIS — I517 Cardiomegaly: Secondary | ICD-10-CM | POA: Diagnosis not present

## 2022-01-19 DIAGNOSIS — R296 Repeated falls: Secondary | ICD-10-CM | POA: Diagnosis not present

## 2022-01-19 DIAGNOSIS — Z1152 Encounter for screening for COVID-19: Secondary | ICD-10-CM | POA: Diagnosis not present

## 2022-01-19 DIAGNOSIS — R1111 Vomiting without nausea: Secondary | ICD-10-CM | POA: Diagnosis not present

## 2022-01-19 DIAGNOSIS — S0990XA Unspecified injury of head, initial encounter: Secondary | ICD-10-CM | POA: Diagnosis not present

## 2022-01-19 DIAGNOSIS — R410 Disorientation, unspecified: Secondary | ICD-10-CM | POA: Diagnosis not present

## 2022-01-19 DIAGNOSIS — N3000 Acute cystitis without hematuria: Secondary | ICD-10-CM | POA: Diagnosis not present

## 2022-01-19 DIAGNOSIS — R531 Weakness: Secondary | ICD-10-CM | POA: Diagnosis not present

## 2022-01-19 DIAGNOSIS — Z743 Need for continuous supervision: Secondary | ICD-10-CM | POA: Diagnosis not present

## 2022-01-20 DIAGNOSIS — E785 Hyperlipidemia, unspecified: Secondary | ICD-10-CM | POA: Diagnosis not present

## 2022-01-21 DIAGNOSIS — N390 Urinary tract infection, site not specified: Secondary | ICD-10-CM | POA: Diagnosis not present

## 2022-01-21 DIAGNOSIS — D72829 Elevated white blood cell count, unspecified: Secondary | ICD-10-CM | POA: Diagnosis not present

## 2022-01-21 DIAGNOSIS — N179 Acute kidney failure, unspecified: Secondary | ICD-10-CM | POA: Diagnosis not present

## 2022-01-21 DIAGNOSIS — I10 Essential (primary) hypertension: Secondary | ICD-10-CM | POA: Diagnosis not present

## 2022-01-21 DIAGNOSIS — W19XXXA Unspecified fall, initial encounter: Secondary | ICD-10-CM | POA: Diagnosis not present

## 2022-01-22 DIAGNOSIS — M25572 Pain in left ankle and joints of left foot: Secondary | ICD-10-CM | POA: Diagnosis not present

## 2022-01-22 DIAGNOSIS — E785 Hyperlipidemia, unspecified: Secondary | ICD-10-CM | POA: Diagnosis not present

## 2022-01-22 DIAGNOSIS — M79662 Pain in left lower leg: Secondary | ICD-10-CM | POA: Diagnosis not present

## 2022-01-22 DIAGNOSIS — I10 Essential (primary) hypertension: Secondary | ICD-10-CM | POA: Diagnosis not present

## 2022-01-24 DIAGNOSIS — D72829 Elevated white blood cell count, unspecified: Secondary | ICD-10-CM | POA: Diagnosis not present

## 2022-01-24 DIAGNOSIS — N179 Acute kidney failure, unspecified: Secondary | ICD-10-CM | POA: Diagnosis not present

## 2022-01-24 DIAGNOSIS — R278 Other lack of coordination: Secondary | ICD-10-CM | POA: Diagnosis not present

## 2022-01-24 DIAGNOSIS — F03911 Unspecified dementia, unspecified severity, with agitation: Secondary | ICD-10-CM | POA: Diagnosis not present

## 2022-01-24 DIAGNOSIS — N390 Urinary tract infection, site not specified: Secondary | ICD-10-CM | POA: Diagnosis not present

## 2022-01-24 DIAGNOSIS — I10 Essential (primary) hypertension: Secondary | ICD-10-CM | POA: Diagnosis not present

## 2022-01-24 DIAGNOSIS — M6259 Muscle wasting and atrophy, not elsewhere classified, multiple sites: Secondary | ICD-10-CM | POA: Diagnosis not present

## 2022-01-24 DIAGNOSIS — D72820 Lymphocytosis (symptomatic): Secondary | ICD-10-CM | POA: Diagnosis not present

## 2022-01-24 DIAGNOSIS — R262 Difficulty in walking, not elsewhere classified: Secondary | ICD-10-CM | POA: Diagnosis not present

## 2022-01-24 DIAGNOSIS — R112 Nausea with vomiting, unspecified: Secondary | ICD-10-CM | POA: Diagnosis not present

## 2022-01-25 DIAGNOSIS — N390 Urinary tract infection, site not specified: Secondary | ICD-10-CM | POA: Diagnosis not present

## 2022-01-25 DIAGNOSIS — R262 Difficulty in walking, not elsewhere classified: Secondary | ICD-10-CM | POA: Diagnosis not present

## 2022-01-25 DIAGNOSIS — R278 Other lack of coordination: Secondary | ICD-10-CM | POA: Diagnosis not present

## 2022-01-25 DIAGNOSIS — M6259 Muscle wasting and atrophy, not elsewhere classified, multiple sites: Secondary | ICD-10-CM | POA: Diagnosis not present

## 2022-01-25 DIAGNOSIS — R112 Nausea with vomiting, unspecified: Secondary | ICD-10-CM | POA: Diagnosis not present

## 2022-01-25 DIAGNOSIS — F03911 Unspecified dementia, unspecified severity, with agitation: Secondary | ICD-10-CM | POA: Diagnosis not present

## 2022-01-26 DIAGNOSIS — R112 Nausea with vomiting, unspecified: Secondary | ICD-10-CM | POA: Diagnosis not present

## 2022-01-26 DIAGNOSIS — N390 Urinary tract infection, site not specified: Secondary | ICD-10-CM | POA: Diagnosis not present

## 2022-01-26 DIAGNOSIS — R109 Unspecified abdominal pain: Secondary | ICD-10-CM | POA: Diagnosis not present

## 2022-01-27 DIAGNOSIS — Z7409 Other reduced mobility: Secondary | ICD-10-CM | POA: Diagnosis not present

## 2022-01-27 DIAGNOSIS — F03911 Unspecified dementia, unspecified severity, with agitation: Secondary | ICD-10-CM | POA: Diagnosis not present

## 2022-01-27 DIAGNOSIS — R2681 Unsteadiness on feet: Secondary | ICD-10-CM | POA: Diagnosis not present

## 2022-01-27 DIAGNOSIS — F03918 Unspecified dementia, unspecified severity, with other behavioral disturbance: Secondary | ICD-10-CM | POA: Diagnosis not present

## 2022-01-27 DIAGNOSIS — M6281 Muscle weakness (generalized): Secondary | ICD-10-CM | POA: Diagnosis not present

## 2022-01-27 DIAGNOSIS — R278 Other lack of coordination: Secondary | ICD-10-CM | POA: Diagnosis not present

## 2022-01-27 DIAGNOSIS — M6259 Muscle wasting and atrophy, not elsewhere classified, multiple sites: Secondary | ICD-10-CM | POA: Diagnosis not present

## 2022-01-28 DIAGNOSIS — E785 Hyperlipidemia, unspecified: Secondary | ICD-10-CM | POA: Diagnosis not present

## 2022-01-28 DIAGNOSIS — T792XXA Traumatic secondary and recurrent hemorrhage and seroma, initial encounter: Secondary | ICD-10-CM | POA: Diagnosis not present

## 2022-01-28 DIAGNOSIS — D72820 Lymphocytosis (symptomatic): Secondary | ICD-10-CM | POA: Diagnosis not present

## 2022-01-28 DIAGNOSIS — N390 Urinary tract infection, site not specified: Secondary | ICD-10-CM | POA: Diagnosis not present

## 2022-01-28 DIAGNOSIS — I10 Essential (primary) hypertension: Secondary | ICD-10-CM | POA: Diagnosis not present

## 2022-01-28 DIAGNOSIS — R112 Nausea with vomiting, unspecified: Secondary | ICD-10-CM | POA: Diagnosis not present

## 2022-01-31 DIAGNOSIS — R278 Other lack of coordination: Secondary | ICD-10-CM | POA: Diagnosis not present

## 2022-01-31 DIAGNOSIS — Z7409 Other reduced mobility: Secondary | ICD-10-CM | POA: Diagnosis not present

## 2022-01-31 DIAGNOSIS — R2681 Unsteadiness on feet: Secondary | ICD-10-CM | POA: Diagnosis not present

## 2022-01-31 DIAGNOSIS — F03911 Unspecified dementia, unspecified severity, with agitation: Secondary | ICD-10-CM | POA: Diagnosis not present

## 2022-01-31 DIAGNOSIS — M6281 Muscle weakness (generalized): Secondary | ICD-10-CM | POA: Diagnosis not present

## 2022-01-31 DIAGNOSIS — M6259 Muscle wasting and atrophy, not elsewhere classified, multiple sites: Secondary | ICD-10-CM | POA: Diagnosis not present

## 2022-02-03 DIAGNOSIS — R112 Nausea with vomiting, unspecified: Secondary | ICD-10-CM | POA: Diagnosis not present

## 2022-02-03 DIAGNOSIS — R197 Diarrhea, unspecified: Secondary | ICD-10-CM | POA: Diagnosis not present

## 2022-02-10 DIAGNOSIS — G4700 Insomnia, unspecified: Secondary | ICD-10-CM | POA: Diagnosis not present

## 2022-02-10 DIAGNOSIS — F0393 Unspecified dementia, unspecified severity, with mood disturbance: Secondary | ICD-10-CM | POA: Diagnosis not present

## 2022-02-11 DIAGNOSIS — Z79899 Other long term (current) drug therapy: Secondary | ICD-10-CM | POA: Diagnosis not present

## 2022-02-12 DIAGNOSIS — F03911 Unspecified dementia, unspecified severity, with agitation: Secondary | ICD-10-CM | POA: Diagnosis not present

## 2022-02-12 DIAGNOSIS — S0093XA Contusion of unspecified part of head, initial encounter: Secondary | ICD-10-CM | POA: Diagnosis not present

## 2022-02-12 DIAGNOSIS — R6889 Other general symptoms and signs: Secondary | ICD-10-CM | POA: Diagnosis not present

## 2022-02-12 DIAGNOSIS — J432 Centrilobular emphysema: Secondary | ICD-10-CM | POA: Diagnosis not present

## 2022-02-12 DIAGNOSIS — M19012 Primary osteoarthritis, left shoulder: Secondary | ICD-10-CM | POA: Diagnosis not present

## 2022-02-12 DIAGNOSIS — S0083XA Contusion of other part of head, initial encounter: Secondary | ICD-10-CM | POA: Diagnosis not present

## 2022-02-12 DIAGNOSIS — Z743 Need for continuous supervision: Secondary | ICD-10-CM | POA: Diagnosis not present

## 2022-02-12 DIAGNOSIS — S0003XA Contusion of scalp, initial encounter: Secondary | ICD-10-CM | POA: Diagnosis not present

## 2022-02-12 DIAGNOSIS — Z9889 Other specified postprocedural states: Secondary | ICD-10-CM | POA: Diagnosis not present

## 2022-02-12 DIAGNOSIS — S40012A Contusion of left shoulder, initial encounter: Secondary | ICD-10-CM | POA: Diagnosis not present

## 2022-02-12 DIAGNOSIS — S199XXA Unspecified injury of neck, initial encounter: Secondary | ICD-10-CM | POA: Diagnosis not present

## 2022-02-12 DIAGNOSIS — J431 Panlobular emphysema: Secondary | ICD-10-CM | POA: Diagnosis not present

## 2022-02-12 DIAGNOSIS — M47812 Spondylosis without myelopathy or radiculopathy, cervical region: Secondary | ICD-10-CM | POA: Diagnosis not present

## 2022-02-12 DIAGNOSIS — S00531A Contusion of lip, initial encounter: Secondary | ICD-10-CM | POA: Diagnosis not present

## 2022-02-12 DIAGNOSIS — R296 Repeated falls: Secondary | ICD-10-CM | POA: Diagnosis not present

## 2022-02-12 DIAGNOSIS — W19XXXA Unspecified fall, initial encounter: Secondary | ICD-10-CM | POA: Diagnosis not present

## 2022-02-14 DIAGNOSIS — M6281 Muscle weakness (generalized): Secondary | ICD-10-CM | POA: Diagnosis not present

## 2022-02-14 DIAGNOSIS — S0003XS Contusion of scalp, sequela: Secondary | ICD-10-CM | POA: Diagnosis not present

## 2022-02-14 DIAGNOSIS — Z7409 Other reduced mobility: Secondary | ICD-10-CM | POA: Diagnosis not present

## 2022-02-14 DIAGNOSIS — I10 Essential (primary) hypertension: Secondary | ICD-10-CM | POA: Diagnosis not present

## 2022-02-14 DIAGNOSIS — F03911 Unspecified dementia, unspecified severity, with agitation: Secondary | ICD-10-CM | POA: Diagnosis not present

## 2022-02-14 DIAGNOSIS — R278 Other lack of coordination: Secondary | ICD-10-CM | POA: Diagnosis not present

## 2022-02-14 DIAGNOSIS — R2681 Unsteadiness on feet: Secondary | ICD-10-CM | POA: Diagnosis not present

## 2022-02-14 DIAGNOSIS — M6259 Muscle wasting and atrophy, not elsewhere classified, multiple sites: Secondary | ICD-10-CM | POA: Diagnosis not present

## 2022-02-15 DIAGNOSIS — M6259 Muscle wasting and atrophy, not elsewhere classified, multiple sites: Secondary | ICD-10-CM | POA: Diagnosis not present

## 2022-02-15 DIAGNOSIS — R2681 Unsteadiness on feet: Secondary | ICD-10-CM | POA: Diagnosis not present

## 2022-02-15 DIAGNOSIS — M6281 Muscle weakness (generalized): Secondary | ICD-10-CM | POA: Diagnosis not present

## 2022-02-15 DIAGNOSIS — F03911 Unspecified dementia, unspecified severity, with agitation: Secondary | ICD-10-CM | POA: Diagnosis not present

## 2022-02-15 DIAGNOSIS — R278 Other lack of coordination: Secondary | ICD-10-CM | POA: Diagnosis not present

## 2022-02-15 DIAGNOSIS — S0003XS Contusion of scalp, sequela: Secondary | ICD-10-CM | POA: Diagnosis not present

## 2022-02-15 DIAGNOSIS — Z7409 Other reduced mobility: Secondary | ICD-10-CM | POA: Diagnosis not present

## 2022-02-16 ENCOUNTER — Telehealth: Payer: Self-pay

## 2022-02-16 DIAGNOSIS — Z7409 Other reduced mobility: Secondary | ICD-10-CM | POA: Diagnosis not present

## 2022-02-16 DIAGNOSIS — R059 Cough, unspecified: Secondary | ICD-10-CM | POA: Diagnosis not present

## 2022-02-16 DIAGNOSIS — S0003XS Contusion of scalp, sequela: Secondary | ICD-10-CM | POA: Diagnosis not present

## 2022-02-16 DIAGNOSIS — R2681 Unsteadiness on feet: Secondary | ICD-10-CM | POA: Diagnosis not present

## 2022-02-16 DIAGNOSIS — Z79899 Other long term (current) drug therapy: Secondary | ICD-10-CM | POA: Diagnosis not present

## 2022-02-16 DIAGNOSIS — R278 Other lack of coordination: Secondary | ICD-10-CM | POA: Diagnosis not present

## 2022-02-16 DIAGNOSIS — F03911 Unspecified dementia, unspecified severity, with agitation: Secondary | ICD-10-CM | POA: Diagnosis not present

## 2022-02-16 DIAGNOSIS — R112 Nausea with vomiting, unspecified: Secondary | ICD-10-CM | POA: Diagnosis not present

## 2022-02-16 DIAGNOSIS — M6281 Muscle weakness (generalized): Secondary | ICD-10-CM | POA: Diagnosis not present

## 2022-02-16 DIAGNOSIS — M6259 Muscle wasting and atrophy, not elsewhere classified, multiple sites: Secondary | ICD-10-CM | POA: Diagnosis not present

## 2022-02-16 NOTE — Telephone Encounter (Signed)
     Patient  visit on 11/18  at [facility]  Have you been able to follow up with your primary care physician? Yes   The patient was or was not able to obtain any needed medicine or equipment. Yes   Are there diet recommendations that you are having difficulty following? Yes     Patient expresses understanding of discharge instructions and education provided has no other needs at this time. Yes      [place], [facility], Care Management  [phone] [address], [place], [place] [address] Phone: [phone] Email: [email or web address]

## 2022-02-17 DIAGNOSIS — F03911 Unspecified dementia, unspecified severity, with agitation: Secondary | ICD-10-CM | POA: Diagnosis not present

## 2022-02-17 DIAGNOSIS — M6259 Muscle wasting and atrophy, not elsewhere classified, multiple sites: Secondary | ICD-10-CM | POA: Diagnosis not present

## 2022-02-17 DIAGNOSIS — Z7409 Other reduced mobility: Secondary | ICD-10-CM | POA: Diagnosis not present

## 2022-02-17 DIAGNOSIS — R278 Other lack of coordination: Secondary | ICD-10-CM | POA: Diagnosis not present

## 2022-02-17 DIAGNOSIS — R2681 Unsteadiness on feet: Secondary | ICD-10-CM | POA: Diagnosis not present

## 2022-02-17 DIAGNOSIS — M6281 Muscle weakness (generalized): Secondary | ICD-10-CM | POA: Diagnosis not present

## 2022-02-18 DIAGNOSIS — F03911 Unspecified dementia, unspecified severity, with agitation: Secondary | ICD-10-CM | POA: Diagnosis not present

## 2022-02-18 DIAGNOSIS — R2681 Unsteadiness on feet: Secondary | ICD-10-CM | POA: Diagnosis not present

## 2022-02-18 DIAGNOSIS — M6259 Muscle wasting and atrophy, not elsewhere classified, multiple sites: Secondary | ICD-10-CM | POA: Diagnosis not present

## 2022-02-18 DIAGNOSIS — R278 Other lack of coordination: Secondary | ICD-10-CM | POA: Diagnosis not present

## 2022-02-18 DIAGNOSIS — Z7409 Other reduced mobility: Secondary | ICD-10-CM | POA: Diagnosis not present

## 2022-02-18 DIAGNOSIS — M6281 Muscle weakness (generalized): Secondary | ICD-10-CM | POA: Diagnosis not present

## 2022-02-19 DIAGNOSIS — M6281 Muscle weakness (generalized): Secondary | ICD-10-CM | POA: Diagnosis not present

## 2022-02-19 DIAGNOSIS — R278 Other lack of coordination: Secondary | ICD-10-CM | POA: Diagnosis not present

## 2022-02-19 DIAGNOSIS — R2681 Unsteadiness on feet: Secondary | ICD-10-CM | POA: Diagnosis not present

## 2022-02-19 DIAGNOSIS — F03911 Unspecified dementia, unspecified severity, with agitation: Secondary | ICD-10-CM | POA: Diagnosis not present

## 2022-02-19 DIAGNOSIS — Z7409 Other reduced mobility: Secondary | ICD-10-CM | POA: Diagnosis not present

## 2022-02-19 DIAGNOSIS — M6259 Muscle wasting and atrophy, not elsewhere classified, multiple sites: Secondary | ICD-10-CM | POA: Diagnosis not present

## 2022-02-21 DIAGNOSIS — R112 Nausea with vomiting, unspecified: Secondary | ICD-10-CM | POA: Diagnosis not present

## 2022-02-21 DIAGNOSIS — R278 Other lack of coordination: Secondary | ICD-10-CM | POA: Diagnosis not present

## 2022-02-21 DIAGNOSIS — R2681 Unsteadiness on feet: Secondary | ICD-10-CM | POA: Diagnosis not present

## 2022-02-21 DIAGNOSIS — Z7409 Other reduced mobility: Secondary | ICD-10-CM | POA: Diagnosis not present

## 2022-02-21 DIAGNOSIS — Z79899 Other long term (current) drug therapy: Secondary | ICD-10-CM | POA: Diagnosis not present

## 2022-02-21 DIAGNOSIS — R059 Cough, unspecified: Secondary | ICD-10-CM | POA: Diagnosis not present

## 2022-02-21 DIAGNOSIS — F03911 Unspecified dementia, unspecified severity, with agitation: Secondary | ICD-10-CM | POA: Diagnosis not present

## 2022-02-21 DIAGNOSIS — M6281 Muscle weakness (generalized): Secondary | ICD-10-CM | POA: Diagnosis not present

## 2022-02-21 DIAGNOSIS — M6259 Muscle wasting and atrophy, not elsewhere classified, multiple sites: Secondary | ICD-10-CM | POA: Diagnosis not present

## 2022-02-22 DIAGNOSIS — S0003XS Contusion of scalp, sequela: Secondary | ICD-10-CM | POA: Diagnosis not present

## 2022-02-22 DIAGNOSIS — M6259 Muscle wasting and atrophy, not elsewhere classified, multiple sites: Secondary | ICD-10-CM | POA: Diagnosis not present

## 2022-02-22 DIAGNOSIS — Z7409 Other reduced mobility: Secondary | ICD-10-CM | POA: Diagnosis not present

## 2022-02-22 DIAGNOSIS — R278 Other lack of coordination: Secondary | ICD-10-CM | POA: Diagnosis not present

## 2022-02-22 DIAGNOSIS — M6281 Muscle weakness (generalized): Secondary | ICD-10-CM | POA: Diagnosis not present

## 2022-02-22 DIAGNOSIS — R2681 Unsteadiness on feet: Secondary | ICD-10-CM | POA: Diagnosis not present

## 2022-02-22 DIAGNOSIS — F02818 Dementia in other diseases classified elsewhere, unspecified severity, with other behavioral disturbance: Secondary | ICD-10-CM | POA: Diagnosis not present

## 2022-02-22 DIAGNOSIS — R112 Nausea with vomiting, unspecified: Secondary | ICD-10-CM | POA: Diagnosis not present

## 2022-02-22 DIAGNOSIS — R296 Repeated falls: Secondary | ICD-10-CM | POA: Diagnosis not present

## 2022-02-22 DIAGNOSIS — F03911 Unspecified dementia, unspecified severity, with agitation: Secondary | ICD-10-CM | POA: Diagnosis not present

## 2022-02-23 DIAGNOSIS — R278 Other lack of coordination: Secondary | ICD-10-CM | POA: Diagnosis not present

## 2022-02-23 DIAGNOSIS — E119 Type 2 diabetes mellitus without complications: Secondary | ICD-10-CM | POA: Diagnosis not present

## 2022-02-23 DIAGNOSIS — M6259 Muscle wasting and atrophy, not elsewhere classified, multiple sites: Secondary | ICD-10-CM | POA: Diagnosis not present

## 2022-02-23 DIAGNOSIS — E559 Vitamin D deficiency, unspecified: Secondary | ICD-10-CM | POA: Diagnosis not present

## 2022-02-23 DIAGNOSIS — F03911 Unspecified dementia, unspecified severity, with agitation: Secondary | ICD-10-CM | POA: Diagnosis not present

## 2022-02-23 DIAGNOSIS — R2681 Unsteadiness on feet: Secondary | ICD-10-CM | POA: Diagnosis not present

## 2022-02-23 DIAGNOSIS — D72820 Lymphocytosis (symptomatic): Secondary | ICD-10-CM | POA: Diagnosis not present

## 2022-02-23 DIAGNOSIS — Z7409 Other reduced mobility: Secondary | ICD-10-CM | POA: Diagnosis not present

## 2022-02-23 DIAGNOSIS — Z79899 Other long term (current) drug therapy: Secondary | ICD-10-CM | POA: Diagnosis not present

## 2022-02-23 DIAGNOSIS — M6281 Muscle weakness (generalized): Secondary | ICD-10-CM | POA: Diagnosis not present

## 2022-02-23 DIAGNOSIS — E785 Hyperlipidemia, unspecified: Secondary | ICD-10-CM | POA: Diagnosis not present

## 2022-02-24 DIAGNOSIS — Z7409 Other reduced mobility: Secondary | ICD-10-CM | POA: Diagnosis not present

## 2022-02-24 DIAGNOSIS — M6281 Muscle weakness (generalized): Secondary | ICD-10-CM | POA: Diagnosis not present

## 2022-02-24 DIAGNOSIS — F03911 Unspecified dementia, unspecified severity, with agitation: Secondary | ICD-10-CM | POA: Diagnosis not present

## 2022-02-24 DIAGNOSIS — R278 Other lack of coordination: Secondary | ICD-10-CM | POA: Diagnosis not present

## 2022-02-24 DIAGNOSIS — M6259 Muscle wasting and atrophy, not elsewhere classified, multiple sites: Secondary | ICD-10-CM | POA: Diagnosis not present

## 2022-02-24 DIAGNOSIS — R2681 Unsteadiness on feet: Secondary | ICD-10-CM | POA: Diagnosis not present

## 2022-02-25 DIAGNOSIS — R112 Nausea with vomiting, unspecified: Secondary | ICD-10-CM | POA: Diagnosis not present

## 2022-02-25 DIAGNOSIS — Z7409 Other reduced mobility: Secondary | ICD-10-CM | POA: Diagnosis not present

## 2022-02-25 DIAGNOSIS — M25552 Pain in left hip: Secondary | ICD-10-CM | POA: Diagnosis not present

## 2022-02-25 DIAGNOSIS — F03911 Unspecified dementia, unspecified severity, with agitation: Secondary | ICD-10-CM | POA: Diagnosis not present

## 2022-02-25 DIAGNOSIS — M6259 Muscle wasting and atrophy, not elsewhere classified, multiple sites: Secondary | ICD-10-CM | POA: Diagnosis not present

## 2022-02-25 DIAGNOSIS — R296 Repeated falls: Secondary | ICD-10-CM | POA: Diagnosis not present

## 2022-02-25 DIAGNOSIS — R2681 Unsteadiness on feet: Secondary | ICD-10-CM | POA: Diagnosis not present

## 2022-02-28 DIAGNOSIS — Z7409 Other reduced mobility: Secondary | ICD-10-CM | POA: Diagnosis not present

## 2022-02-28 DIAGNOSIS — M6259 Muscle wasting and atrophy, not elsewhere classified, multiple sites: Secondary | ICD-10-CM | POA: Diagnosis not present

## 2022-02-28 DIAGNOSIS — R2681 Unsteadiness on feet: Secondary | ICD-10-CM | POA: Diagnosis not present

## 2022-02-28 DIAGNOSIS — F03911 Unspecified dementia, unspecified severity, with agitation: Secondary | ICD-10-CM | POA: Diagnosis not present

## 2022-03-01 ENCOUNTER — Emergency Department (HOSPITAL_COMMUNITY)
Admission: EM | Admit: 2022-03-01 | Discharge: 2022-03-02 | Disposition: A | Discharge status: Skilled Nursing Facility | Payer: Medicare Other | Attending: Student | Admitting: Student

## 2022-03-01 ENCOUNTER — Emergency Department (HOSPITAL_COMMUNITY): Payer: Medicare Other

## 2022-03-01 DIAGNOSIS — Z955 Presence of coronary angioplasty implant and graft: Secondary | ICD-10-CM | POA: Diagnosis not present

## 2022-03-01 DIAGNOSIS — Z96652 Presence of left artificial knee joint: Secondary | ICD-10-CM | POA: Insufficient documentation

## 2022-03-01 DIAGNOSIS — R112 Nausea with vomiting, unspecified: Secondary | ICD-10-CM | POA: Insufficient documentation

## 2022-03-01 DIAGNOSIS — S0003XA Contusion of scalp, initial encounter: Secondary | ICD-10-CM | POA: Diagnosis not present

## 2022-03-01 DIAGNOSIS — R001 Bradycardia, unspecified: Secondary | ICD-10-CM | POA: Diagnosis not present

## 2022-03-01 DIAGNOSIS — S0181XD Laceration without foreign body of other part of head, subsequent encounter: Secondary | ICD-10-CM | POA: Diagnosis not present

## 2022-03-01 DIAGNOSIS — W19XXXA Unspecified fall, initial encounter: Secondary | ICD-10-CM | POA: Diagnosis not present

## 2022-03-01 DIAGNOSIS — W19XXXD Unspecified fall, subsequent encounter: Secondary | ICD-10-CM | POA: Diagnosis not present

## 2022-03-01 DIAGNOSIS — E876 Hypokalemia: Secondary | ICD-10-CM | POA: Insufficient documentation

## 2022-03-01 DIAGNOSIS — Z7409 Other reduced mobility: Secondary | ICD-10-CM | POA: Diagnosis not present

## 2022-03-01 DIAGNOSIS — I700 Atherosclerosis of aorta: Secondary | ICD-10-CM | POA: Diagnosis not present

## 2022-03-01 DIAGNOSIS — R296 Repeated falls: Secondary | ICD-10-CM | POA: Diagnosis not present

## 2022-03-01 DIAGNOSIS — I10 Essential (primary) hypertension: Secondary | ICD-10-CM | POA: Diagnosis not present

## 2022-03-01 DIAGNOSIS — R7401 Elevation of levels of liver transaminase levels: Secondary | ICD-10-CM | POA: Diagnosis not present

## 2022-03-01 DIAGNOSIS — F03911 Unspecified dementia, unspecified severity, with agitation: Secondary | ICD-10-CM | POA: Diagnosis not present

## 2022-03-01 DIAGNOSIS — Z7901 Long term (current) use of anticoagulants: Secondary | ICD-10-CM | POA: Diagnosis not present

## 2022-03-01 DIAGNOSIS — E871 Hypo-osmolality and hyponatremia: Secondary | ICD-10-CM | POA: Insufficient documentation

## 2022-03-01 DIAGNOSIS — Z743 Need for continuous supervision: Secondary | ICD-10-CM | POA: Diagnosis not present

## 2022-03-01 DIAGNOSIS — M6259 Muscle wasting and atrophy, not elsewhere classified, multiple sites: Secondary | ICD-10-CM | POA: Diagnosis not present

## 2022-03-01 DIAGNOSIS — R1110 Vomiting, unspecified: Secondary | ICD-10-CM | POA: Diagnosis not present

## 2022-03-01 DIAGNOSIS — R2681 Unsteadiness on feet: Secondary | ICD-10-CM | POA: Diagnosis not present

## 2022-03-01 DIAGNOSIS — S199XXA Unspecified injury of neck, initial encounter: Secondary | ICD-10-CM | POA: Diagnosis not present

## 2022-03-01 DIAGNOSIS — R110 Nausea: Secondary | ICD-10-CM | POA: Diagnosis not present

## 2022-03-01 DIAGNOSIS — R109 Unspecified abdominal pain: Secondary | ICD-10-CM | POA: Diagnosis not present

## 2022-03-01 DIAGNOSIS — I2510 Atherosclerotic heart disease of native coronary artery without angina pectoris: Secondary | ICD-10-CM | POA: Diagnosis not present

## 2022-03-01 LAB — CBC
HCT: 39.1 % (ref 36.0–46.0)
Hemoglobin: 12.9 g/dL (ref 12.0–15.0)
MCH: 29.9 pg (ref 26.0–34.0)
MCHC: 33 g/dL (ref 30.0–36.0)
MCV: 90.7 fL (ref 80.0–100.0)
Platelets: 184 10*3/uL (ref 150–400)
RBC: 4.31 MIL/uL (ref 3.87–5.11)
RDW: 13.6 % (ref 11.5–15.5)
WBC: 7.3 10*3/uL (ref 4.0–10.5)
nRBC: 0 % (ref 0.0–0.2)

## 2022-03-01 LAB — COMPREHENSIVE METABOLIC PANEL
ALT: 44 U/L (ref 0–44)
AST: 69 U/L — ABNORMAL HIGH (ref 15–41)
Albumin: 3.4 g/dL — ABNORMAL LOW (ref 3.5–5.0)
Alkaline Phosphatase: 85 U/L (ref 38–126)
Anion gap: 9 (ref 5–15)
BUN: 19 mg/dL (ref 8–23)
CO2: 24 mmol/L (ref 22–32)
Calcium: 9.6 mg/dL (ref 8.9–10.3)
Chloride: 97 mmol/L — ABNORMAL LOW (ref 98–111)
Creatinine, Ser: 0.92 mg/dL (ref 0.44–1.00)
GFR, Estimated: >60 mL/min (ref >60)
Glucose, Bld: 82 mg/dL (ref 70–99)
Potassium: 3.1 mmol/L — ABNORMAL LOW (ref 3.5–5.1)
Sodium: 130 mmol/L — ABNORMAL LOW (ref 135–145)
Total Bilirubin: 2.2 mg/dL — ABNORMAL HIGH (ref 0.3–1.2)
Total Protein: 6.3 g/dL — ABNORMAL LOW (ref 6.5–8.1)

## 2022-03-01 LAB — LIPASE, BLOOD: Lipase: 36 U/L (ref 11–51)

## 2022-03-01 MED ORDER — ONDANSETRON 4 MG PO TBDP: 4.0000 mg | ORAL_TABLET | Freq: Once | ORAL | Status: DC

## 2022-03-01 MED ORDER — ONDANSETRON HCL 4 MG/2ML IJ SOLN
4.0000 mg | Freq: Once | INTRAMUSCULAR | Status: AC
  Administered 2022-03-01: 4 mg via INTRAVENOUS
  Filled 2022-03-01: qty 2

## 2022-03-01 MED ORDER — ONDANSETRON 4 MG PO TBDP: 4.0000 mg | ORAL_TABLET | Freq: Three times a day (TID) | ORAL | 0 refills | Status: DC | PRN

## 2022-03-01 MED ORDER — IOHEXOL 350 MG/ML SOLN
75.0000 mL | Freq: Once | INTRAVENOUS | Status: AC | PRN
  Administered 2022-03-01: 75 mL via INTRAVENOUS

## 2022-03-01 NOTE — ED Provider Notes (Incomplete)
[place] EMERGENCY DEPARTMENT Provider Note   CSN: [id number] Arrival date & time: 03/01/22  1630     History {Add pertinent medical, surgical, social history, OB history to HPI:1} No chief complaint on file.   [name] is a 76 y.o. female with a past medical history of CAD s/p PCI of LAD/RCA, cath 2007 with 30% mid LAD, 20% OM1, 70% distal left circ too small for PCI, 90% acute RV mariginal too small for PCI, hyperlipidemia, hypertension, OSA, PAF, ROS BIB by [facility] to the emergency room for evaluation of nausea and vomiting.  I spoke to patient's daughter on the phone.  She states that patient has had vomiting periodically in the last 3 weeks.  She states patient has suffered from multiple falls this year, sometimes 9-10 times within a week.  The last time she fell was 2 weeks ago when she started to have hematoma on her face. This morning patient came up to the nursing staff at [facility] and says she feels drunk.  Denies any fever, abdominal pain, constipation, diarrhea.  Denies seeing blood in his stool or urine.  Denies chest pain, shortness of breath, urinary symptoms.  .  She does have multiple hematoma on her face. Denies feeling nauseous at the moment. Patient is taking Eliquis.  HPI    Past Medical History:  Diagnosis Date  . Arthritis   . Coronary artery disease    s/p PCI of LAD/RCA, cath 2007 with 30% mid LAD, 20% OM1, 70% distal left circ too small for PCI, 90% acute RV mariginal too small for PCI  . Depression   . Diastolic dysfunction   . GERD (gastroesophageal reflux disease)   . Headache    hx migraines 32 yrs ago  . Hyperlipemia   . Hypertension   . OSA (obstructive sleep apnea)   . PAF (paroxysmal atrial fibrillation) ([place])   . RLS (restless legs syndrome)    Past Surgical History:  Procedure Laterality Date  . both wrist surgery  1985  . CARDIAC CATHETERIZATION    . CHOLECYSTECTOMY    . COLONOSCOPY WITH PROPOFOL N/A  08/23/2016   Procedure: COLONOSCOPY WITH PROPOFOL;  Surgeon: Garlan Fair, MD;  Location: [facility] END[facility];  Service: Endoscopy;  Laterality: N/A;  . CORONARY STENT PLACEMENT  2006   x2  . LUMBAR DISC SURGERY    . ROTATOR CUFF REPAIR Left    shoulder  . TENDON REPAIR Right    ankle  . TOTAL KNEE ARTHROPLASTY Left 12/21/2012   Procedure: LEFT TOTAL KNEE ARTHROPLASTY;  Surgeon: Augustin Schooling, MD;  Location: [place];  Service: Orthopedics;  Laterality: Left;     Home Medications Prior to Admission medications   Medication Sig Start Date End Date Taking? Authorizing Provider  acetaminophen (TYLENOL) 650 MG CR tablet 2 tablets as needed    [provider]  amiodarone (PACERONE) 200 MG tablet Take 1 tablet (200 mg total) by mouth daily. 08/13/21   Sueanne Margarita, MD  apixaban (ELIQUIS) 5 MG TABS tablet Take 1 tablet (5 mg total) by mouth 2 (two) times daily. 05/26/21   Sueanne Margarita, MD  Calcium Carbonate-Vitamin D 600-400 MG-UNIT tablet Take 1 tablet by mouth daily.    [provider]  Carboxymethylcellul-Glycerin (CLEAR EYES FOR DRY EYES OP) Place 1 drop into both eyes 2 (two) times daily.    [provider]  Cholecalciferol (VITAMIN D) 2000 units CAPS Take 2,000 Units by mouth daily.  [provider]  Collagen Matrix Fenest, Porc, 6X9CM SHEE Apply topically daily.    [provider]  diltiazem (CARDIZEM) 30 MG tablet Take 1 tablet every 4 hours AS NEEDED for AFIB heart rate >100 02/25/20   Sherran Needs, NP  ezetimibe (ZETIA) 10 MG tablet Take 1 tablet (10 mg total) by mouth every evening. 05/26/21   Sueanne Margarita, MD  furosemide (LASIX) 20 MG tablet Take 1 tablet (20 mg total) by mouth daily. 05/26/21   Sueanne Margarita, MD  isosorbide mononitrate (IMDUR) 60 MG 24 hr tablet Take 1 tablet (60 mg total) by mouth daily. 05/26/21   Sueanne Margarita, MD  losartan (COZAAR) 100 MG tablet TAKE 1 TABLET(100 MG) BY MOUTH DAILY 01/19/21   Sueanne Margarita, MD   Magnesium 500 MG TABS Take 1 tablet by mouth daily in the afternoon.    [provider]  Omega-3 Fatty Acids (FISH OIL PO) Take 1 capsule by mouth daily.     [provider]  pantoprazole (PROTONIX) 40 MG tablet Take 40 mg by mouth daily.    [provider]  pravastatin (PRAVACHOL) 80 MG tablet Take 80 mg by mouth every evening.    [provider]  rOPINIRole (REQUIP) 0.5 MG tablet Take 0.5-1 mg by mouth 2 (two) times daily. For restless leg syndrome 1 IN THE MORNING AND 2 AT NIGHT    [provider]  sertraline (ZOLOFT) 50 MG tablet Take 50 mg by mouth at bedtime.     [provider]      Allergies    Lipitor [atorvastatin], Other, Prednisone, and Limbitrol ds  [chlordiazepoxide-amitriptyline]    Review of Systems   Review of Systems  Gastrointestinal:  Positive for nausea and vomiting.    Physical Exam Updated Vital Signs There were no vitals taken for this visit. Physical Exam Vitals and nursing note reviewed.  Constitutional:      Appearance: Normal appearance.  HENT:     Head: Normocephalic and atraumatic.     Mouth/Throat:     Mouth: Mucous membranes are moist.  Eyes:     General: No scleral icterus. Cardiovascular:     Rate and Rhythm: Normal rate and regular rhythm.     Pulses: Normal pulses.     Heart sounds: Normal heart sounds.  Pulmonary:     Effort: Pulmonary effort is normal.     Breath sounds: Normal breath sounds.  Abdominal:     General: Abdomen is flat.     Palpations: Abdomen is soft.     Tenderness: There is no abdominal tenderness.  Musculoskeletal:        General: No deformity.  Skin:    General: Skin is warm.     Findings: No rash.     Comments: Multiple hematoma on face with a laceration on the left forehead due to previous fall.  Neurological:     General: No focal deficit present.     Mental Status: She is alert.  Psychiatric:        Mood and Affect: Mood normal.     ED Results /  Procedures / Treatments   Labs (all labs ordered are listed, but only abnormal results are displayed) Labs Reviewed - No data to display  EKG None  Radiology No results found.  Procedures Procedures  {Document cardiac monitor, telemetry assessment procedure when appropriate:1}  Medications Ordered in ED Medications - No data to display  ED Course/ Medical Decision Making/ A&P  Medical Decision Making Amount and/or Complexity of Data Reviewed Labs: ordered. Radiology: ordered.  Risk Prescription drug management.   This patient presents to the ED for nausea, vomiting, this involves an extensive number of treatment options, and is a complaint that carries with a high risk of complications and morbidity.  The differential diagnosis includes small bowel obstruction, acute gastroenteritis, appendicitis, gallbladder/biliary, pancreatitis, peptic ulcer disease, perforation, increased ICP meningitis, vertigo ACS/MI, DKA, EtOH intoxication, cannabinoid hyperemesis.  This is not an exhaustive list.  Comorbidities that complicate the patient evaluation See HPI  Social determinants of health NA  Additional history obtained: External records from outside source obtained and reviewed including: Chart review including previous notes, labs, imaging.  Cardiac monitoring/EKG: The patient was maintained on a cardiac monitor.  I personally reviewed and interpreted the cardiac monitor which showed an underlying rhythm of: Sinus rhythm.  Lab tests: I ordered and personally interpreted labs.  The pertinent results include: WBC unremarkable. Hbg unremarkable. Platelets unremarkable. No electrolyte abnormalities noted. BUN, creatinine unremarkable. LFT unremarkable. UA significant for no acute abnormality. ***  Imaging studies: I ordered imaging studies including: *** showed ***.  I personally reviewed, interpreted imaging and agree with the radiologist's  interpretations.  Problem list/ ED course/ Critical interventions/ Medical management: HPI: See above Vital signs ***otherwise within normal range and stable throughout visit. Laboratory/imaging studies significant for: See above. On physical examination, patient is afebrile and appears in no acute distress. ***.  Patient's clinical presentations and laboratory/imaging studies are most concerned for ***. *** ordered. Reevaluation of the patient after these medications showed that the patient {resolved/improved/worsened:23923::"improved"}. Advised patient to ***. I have reviewed the patient home medicines and have made adjustments as needed.  Consultations obtained: I requested consultation with Dr. ***, and discussed lab and imaging findings as well as pertinent plan.  He/she agrees with the plan ***.  Disposition Continued outpatient therapy. Follow-up with PCP *** recommended for reevaluation of symptoms. Treatment plan discussed with patient.  Pt acknowledged understanding was agreeable to the plan. Worrisome signs and symptoms were discussed with patient, and patient acknowledged understanding to return to the ED if they noticed these signs and symptoms. Patient was stable upon discharge.   This chart was dictated using voice recognition software.  Despite best efforts to proofread,  errors can occur which can change the documentation meaning.    {Document critical care time when appropriate:1} {Document review of labs and clinical decision tools ie heart score, Chads2Vasc2 etc:1}  {Document your independent review of radiology images, and any outside records:1} {Document your discussion with family members, caretakers, and with consultants:1} {Document social determinants of health affecting pt's care:1} {Document your decision making why or why not admission, treatments were needed:1} Final Clinical Impression(s) / ED Diagnoses Final diagnoses:  None    Rx / DC Orders ED Discharge  Orders     None

## 2022-03-01 NOTE — ED Triage Notes (Signed)
Pt BIB [facility] for N/V x 2 weeks. Pt has multiple bruises from multiple falls which were evaluated previous to this visit. Pt is on Eliquis.  Pt labs from facility showed elevated ALT's per EMS. PT has hx of restless leg which has made her very squirmy on stretcher.  Sent by PA at [facility] whose infor is in paperwork per EMS.  EMS VS: 140/90 98%, 62, CBG 153

## 2022-03-01 NOTE — ED Provider Notes (Signed)
[place] EMERGENCY DEPARTMENT Provider Note   CSN: [id number] Arrival date & time: 03/01/22  1630     History {Add pertinent medical, surgical, social history, OB history to HPI:1} No chief complaint on file.   [name] is a 76 y.o. female with a past medical history of CAD s/p PCI of LAD/RCA, cath 2007 with 30% mid LAD, 20% OM1, 70% distal left circ too small for PCI, 90% acute RV mariginal too small for PCI, hyperlipidemia, hypertension, OSA, PAF, ROS BIB by [facility] to the emergency room for evaluation of nausea and vomiting.  I spoke to patient's daughter on the phone.  She states that patient has had vomiting periodically in the last 3 weeks.  She states patient has suffered from multiple falls this year, sometimes 9-10 times within a week.  The last time she fell was 2 weeks ago when she started to have hematoma on her face. This morning patient came up to the nursing staff at [facility] and says she feels drunk.  Denies any fever, abdominal pain, constipation, diarrhea.  Denies seeing blood in his stool or urine.  Denies chest pain, shortness of breath, urinary symptoms.  .  She does have multiple hematoma on her face. Denies feeling nauseous at the moment. Patient is taking Eliquis.  HPI    Past Medical History:  Diagnosis Date   Arthritis    Coronary artery disease    s/p PCI of LAD/RCA, cath 2007 with 30% mid LAD, 20% OM1, 70% distal left circ too small for PCI, 90% acute RV mariginal too small for PCI   Depression    Diastolic dysfunction    GERD (gastroesophageal reflux disease)    Headache    hx migraines 32 yrs ago   Hyperlipemia    Hypertension    OSA (obstructive sleep apnea)    PAF (paroxysmal atrial fibrillation) ([facility])    RLS (restless legs syndrome)    Past Surgical History:  Procedure Laterality Date   both wrist surgery  1985   CARDIAC CATHETERIZATION     CHOLECYSTECTOMY     COLONOSCOPY WITH PROPOFOL N/A 08/23/2016    Procedure: COLONOSCOPY WITH PROPOFOL;  Surgeon: Garlan Fair, MD;  Location: [facility];  Service: Endoscopy;  Laterality: N/A;   CORONARY STENT PLACEMENT  2006   x2   LUMBAR DISC SURGERY     ROTATOR CUFF REPAIR Left    shoulder   TENDON REPAIR Right    ankle   TOTAL KNEE ARTHROPLASTY Left 12/21/2012   Procedure: LEFT TOTAL KNEE ARTHROPLASTY;  Surgeon: Augustin Schooling, MD;  Location: [place];  Service: Orthopedics;  Laterality: Left;     Home Medications Prior to Admission medications   Medication Sig Start Date End Date Taking? Authorizing Provider  acetaminophen (TYLENOL) 650 MG CR tablet 2 tablets as needed    [provider]  amiodarone (PACERONE) 200 MG tablet Take 1 tablet (200 mg total) by mouth daily. 08/13/21   Sueanne Margarita, MD  apixaban (ELIQUIS) 5 MG TABS tablet Take 1 tablet (5 mg total) by mouth 2 (two) times daily. 05/26/21   Sueanne Margarita, MD  Calcium Carbonate-Vitamin D 600-400 MG-UNIT tablet Take 1 tablet by mouth daily.    [provider]  Carboxymethylcellul-Glycerin (CLEAR EYES FOR DRY EYES OP) Place 1 drop into both eyes 2 (two) times daily.    [provider]  Cholecalciferol (VITAMIN D) 2000 units CAPS Take 2,000 Units by mouth daily.  [provider]  Collagen Matrix Fenest, Porc, 6X9CM SHEE Apply topically daily.    [provider]  diltiazem (CARDIZEM) 30 MG tablet Take 1 tablet every 4 hours AS NEEDED for AFIB heart rate >100 02/25/20   Sherran Needs, NP  ezetimibe (ZETIA) 10 MG tablet Take 1 tablet (10 mg total) by mouth every evening. 05/26/21   Sueanne Margarita, MD  furosemide (LASIX) 20 MG tablet Take 1 tablet (20 mg total) by mouth daily. 05/26/21   Sueanne Margarita, MD  isosorbide mononitrate (IMDUR) 60 MG 24 hr tablet Take 1 tablet (60 mg total) by mouth daily. 05/26/21   Sueanne Margarita, MD  losartan (COZAAR) 100 MG tablet TAKE 1 TABLET(100 MG) BY MOUTH DAILY 01/19/21   Sueanne Margarita, MD  Magnesium 500 MG  TABS Take 1 tablet by mouth daily in the afternoon.    [provider]  Omega-3 Fatty Acids (FISH OIL PO) Take 1 capsule by mouth daily.     [provider]  pantoprazole (PROTONIX) 40 MG tablet Take 40 mg by mouth daily.    [provider]  pravastatin (PRAVACHOL) 80 MG tablet Take 80 mg by mouth every evening.    [provider]  rOPINIRole (REQUIP) 0.5 MG tablet Take 0.5-1 mg by mouth 2 (two) times daily. For restless leg syndrome 1 IN THE MORNING AND 2 AT NIGHT    [provider]  sertraline (ZOLOFT) 50 MG tablet Take 50 mg by mouth at bedtime.     [provider]      Allergies    Lipitor [atorvastatin], Other, Prednisone, and Limbitrol ds  [chlordiazepoxide-amitriptyline]    Review of Systems   Review of Systems  Gastrointestinal:  Positive for nausea and vomiting.    Physical Exam Updated Vital Signs There were no vitals taken for this visit. Physical Exam Vitals and nursing note reviewed.  Constitutional:      Appearance: Normal appearance.  HENT:     Head: Normocephalic and atraumatic.     Mouth/Throat:     Mouth: Mucous membranes are moist.  Eyes:     General: No scleral icterus. Cardiovascular:     Rate and Rhythm: Normal rate and regular rhythm.     Pulses: Normal pulses.     Heart sounds: Normal heart sounds.  Pulmonary:     Effort: Pulmonary effort is normal.     Breath sounds: Normal breath sounds.  Abdominal:     General: Abdomen is flat.     Palpations: Abdomen is soft.     Tenderness: There is no abdominal tenderness.  Musculoskeletal:        General: No deformity.  Skin:    General: Skin is warm.     Findings: No rash.     Comments: Multiple hematoma on face with a laceration on the left forehead due to previous fall.  Neurological:     General: No focal deficit present.     Mental Status: She is alert.  Psychiatric:        Mood and Affect: Mood normal.     ED Results / Procedures /  Treatments   Labs (all labs ordered are listed, but only abnormal results are displayed) Labs Reviewed - No data to display  EKG None  Radiology No results found.  Procedures Procedures  {Document cardiac monitor, telemetry assessment procedure when appropriate:1}  Medications Ordered in ED Medications - No data to display  ED Course/ Medical Decision Making/ A&P  Medical Decision Making  This patient presents to the ED for nausea, vomiting, this involves an extensive number of treatment options, and is a complaint that carries with a high risk of complications and morbidity.  The differential diagnosis includes small bowel obstruction, acute gastroenteritis, appendicitis, gallbladder/biliary, pancreatitis, peptic ulcer disease, perforation, increased ICP meningitis, vertigo ACS/MI, DKA, EtOH intoxication, cannabinoid hyperemesis.  This is not an exhaustive list.  Comorbidities that complicate the patient evaluation See HPI  Social determinants of health NA  Additional history obtained: External records from outside source obtained and reviewed including: Chart review including previous notes, labs, imaging.  Cardiac monitoring/EKG: The patient was maintained on a cardiac monitor.  I personally reviewed and interpreted the cardiac monitor which showed an underlying rhythm of: Sinus rhythm.  Lab tests: I ordered and personally interpreted labs.  The pertinent results include: WBC unremarkable. Hbg unremarkable. Platelets unremarkable. No electrolyte abnormalities noted. BUN, creatinine unremarkable. LFT unremarkable. UA significant for no acute abnormality. ***  Imaging studies: I ordered imaging studies including: *** showed ***.  I personally reviewed, interpreted imaging and agree with the radiologist's interpretations.  Problem list/ ED course/ Critical interventions/ Medical management: HPI: See above Vital signs ***otherwise within  normal range and stable throughout visit. Laboratory/imaging studies significant for: See above. On physical examination, patient is afebrile and appears in no acute distress. ***.  Patient's clinical presentations and laboratory/imaging studies are most concerned for ***. *** ordered. Reevaluation of the patient after these medications showed that the patient {resolved/improved/worsened:23923::"improved"}. Advised patient to ***. I have reviewed the patient home medicines and have made adjustments as needed.  Consultations obtained: I requested consultation with Dr. ***, and discussed lab and imaging findings as well as pertinent plan.  He/she agrees with the plan ***.  Disposition Continued outpatient therapy. Follow-up with PCP *** recommended for reevaluation of symptoms. Treatment plan discussed with patient.  Pt acknowledged understanding was agreeable to the plan. Worrisome signs and symptoms were discussed with patient, and patient acknowledged understanding to return to the ED if they noticed these signs and symptoms. Patient was stable upon discharge.   This chart was dictated using voice recognition software.  Despite best efforts to proofread,  errors can occur which can change the documentation meaning.    {Document critical care time when appropriate:1} {Document review of labs and clinical decision tools ie heart score, Chads2Vasc2 etc:1}  {Document your independent review of radiology images, and any outside records:1} {Document your discussion with family members, caretakers, and with consultants:1} {Document social determinants of health affecting pt's care:1} {Document your decision making why or why not admission, treatments were needed:1} Final Clinical Impression(s) / ED Diagnoses Final diagnoses:  None    Rx / DC Orders ED Discharge Orders     None

## 2022-03-01 NOTE — ED Notes (Signed)
Secretary calling [facility] at this time

## 2022-03-01 NOTE — ED Notes (Signed)
Discoloration noted through out face with hematoma to forehead from multiple previous falls

## 2022-03-01 NOTE — ED Notes (Signed)
Pt transported to ct at this time 

## 2022-03-01 NOTE — ED Notes (Signed)
Received verbal report from [place] at this time

## 2022-03-01 NOTE — Discharge Instructions (Addendum)
Please take your medications as prescribed. Please take tylenol/ibuprofen for pain. I recommend close follow-up with PCP for reevaluation.  Please do not hesitate to return to emergency department if worrisome signs symptoms we discussed become apparent.

## 2022-03-01 NOTE — ED Notes (Signed)
Received report from [place] at this time

## 2022-03-02 DIAGNOSIS — R112 Nausea with vomiting, unspecified: Secondary | ICD-10-CM | POA: Diagnosis not present

## 2022-03-02 DIAGNOSIS — R6889 Other general symptoms and signs: Secondary | ICD-10-CM | POA: Diagnosis not present

## 2022-03-02 DIAGNOSIS — Z743 Need for continuous supervision: Secondary | ICD-10-CM | POA: Diagnosis not present

## 2022-03-02 DIAGNOSIS — M6259 Muscle wasting and atrophy, not elsewhere classified, multiple sites: Secondary | ICD-10-CM | POA: Diagnosis not present

## 2022-03-02 DIAGNOSIS — R2681 Unsteadiness on feet: Secondary | ICD-10-CM | POA: Diagnosis not present

## 2022-03-02 DIAGNOSIS — R404 Transient alteration of awareness: Secondary | ICD-10-CM | POA: Diagnosis not present

## 2022-03-02 DIAGNOSIS — Z7409 Other reduced mobility: Secondary | ICD-10-CM | POA: Diagnosis not present

## 2022-03-02 DIAGNOSIS — F03911 Unspecified dementia, unspecified severity, with agitation: Secondary | ICD-10-CM | POA: Diagnosis not present

## 2022-03-02 DIAGNOSIS — W19XXXA Unspecified fall, initial encounter: Secondary | ICD-10-CM | POA: Diagnosis not present

## 2022-03-02 NOTE — ED Notes (Signed)
[facility] arrived at this time for transport

## 2022-03-03 DIAGNOSIS — R2681 Unsteadiness on feet: Secondary | ICD-10-CM | POA: Diagnosis not present

## 2022-03-03 DIAGNOSIS — I10 Essential (primary) hypertension: Secondary | ICD-10-CM | POA: Diagnosis not present

## 2022-03-03 DIAGNOSIS — Z7409 Other reduced mobility: Secondary | ICD-10-CM | POA: Diagnosis not present

## 2022-03-03 DIAGNOSIS — M6259 Muscle wasting and atrophy, not elsewhere classified, multiple sites: Secondary | ICD-10-CM | POA: Diagnosis not present

## 2022-03-03 DIAGNOSIS — F03911 Unspecified dementia, unspecified severity, with agitation: Secondary | ICD-10-CM | POA: Diagnosis not present

## 2022-03-03 DIAGNOSIS — I2510 Atherosclerotic heart disease of native coronary artery without angina pectoris: Secondary | ICD-10-CM | POA: Diagnosis not present

## 2022-03-04 DIAGNOSIS — Z7409 Other reduced mobility: Secondary | ICD-10-CM | POA: Diagnosis not present

## 2022-03-04 DIAGNOSIS — R2681 Unsteadiness on feet: Secondary | ICD-10-CM | POA: Diagnosis not present

## 2022-03-04 DIAGNOSIS — F03911 Unspecified dementia, unspecified severity, with agitation: Secondary | ICD-10-CM | POA: Diagnosis not present

## 2022-03-04 DIAGNOSIS — M6259 Muscle wasting and atrophy, not elsewhere classified, multiple sites: Secondary | ICD-10-CM | POA: Diagnosis not present

## 2022-03-07 DIAGNOSIS — Z7409 Other reduced mobility: Secondary | ICD-10-CM | POA: Diagnosis not present

## 2022-03-07 DIAGNOSIS — F03911 Unspecified dementia, unspecified severity, with agitation: Secondary | ICD-10-CM | POA: Diagnosis not present

## 2022-03-07 DIAGNOSIS — M6259 Muscle wasting and atrophy, not elsewhere classified, multiple sites: Secondary | ICD-10-CM | POA: Diagnosis not present

## 2022-03-07 DIAGNOSIS — R2681 Unsteadiness on feet: Secondary | ICD-10-CM | POA: Diagnosis not present

## 2022-03-07 DIAGNOSIS — I10 Essential (primary) hypertension: Secondary | ICD-10-CM | POA: Diagnosis not present

## 2022-03-08 DIAGNOSIS — Z7409 Other reduced mobility: Secondary | ICD-10-CM | POA: Diagnosis not present

## 2022-03-08 DIAGNOSIS — F03911 Unspecified dementia, unspecified severity, with agitation: Secondary | ICD-10-CM | POA: Diagnosis not present

## 2022-03-08 DIAGNOSIS — M6259 Muscle wasting and atrophy, not elsewhere classified, multiple sites: Secondary | ICD-10-CM | POA: Diagnosis not present

## 2022-03-08 DIAGNOSIS — R2681 Unsteadiness on feet: Secondary | ICD-10-CM | POA: Diagnosis not present

## 2022-03-08 DIAGNOSIS — I2510 Atherosclerotic heart disease of native coronary artery without angina pectoris: Secondary | ICD-10-CM | POA: Diagnosis not present

## 2022-03-08 DIAGNOSIS — R112 Nausea with vomiting, unspecified: Secondary | ICD-10-CM | POA: Diagnosis not present

## 2022-03-08 DIAGNOSIS — I10 Essential (primary) hypertension: Secondary | ICD-10-CM | POA: Diagnosis not present

## 2022-03-08 DIAGNOSIS — I4891 Unspecified atrial fibrillation: Secondary | ICD-10-CM | POA: Diagnosis not present

## 2022-03-09 DIAGNOSIS — Z7409 Other reduced mobility: Secondary | ICD-10-CM | POA: Diagnosis not present

## 2022-03-09 DIAGNOSIS — E876 Hypokalemia: Secondary | ICD-10-CM | POA: Diagnosis not present

## 2022-03-09 DIAGNOSIS — F03911 Unspecified dementia, unspecified severity, with agitation: Secondary | ICD-10-CM | POA: Diagnosis not present

## 2022-03-09 DIAGNOSIS — R112 Nausea with vomiting, unspecified: Secondary | ICD-10-CM | POA: Diagnosis not present

## 2022-03-09 DIAGNOSIS — R2681 Unsteadiness on feet: Secondary | ICD-10-CM | POA: Diagnosis not present

## 2022-03-09 DIAGNOSIS — M6259 Muscle wasting and atrophy, not elsewhere classified, multiple sites: Secondary | ICD-10-CM | POA: Diagnosis not present

## 2022-03-10 DIAGNOSIS — R2681 Unsteadiness on feet: Secondary | ICD-10-CM | POA: Diagnosis not present

## 2022-03-10 DIAGNOSIS — Z7409 Other reduced mobility: Secondary | ICD-10-CM | POA: Diagnosis not present

## 2022-03-10 DIAGNOSIS — M6259 Muscle wasting and atrophy, not elsewhere classified, multiple sites: Secondary | ICD-10-CM | POA: Diagnosis not present

## 2022-03-10 DIAGNOSIS — R112 Nausea with vomiting, unspecified: Secondary | ICD-10-CM | POA: Diagnosis not present

## 2022-03-10 DIAGNOSIS — K769 Liver disease, unspecified: Secondary | ICD-10-CM | POA: Diagnosis not present

## 2022-03-10 DIAGNOSIS — F03911 Unspecified dementia, unspecified severity, with agitation: Secondary | ICD-10-CM | POA: Diagnosis not present

## 2022-03-10 DIAGNOSIS — R197 Diarrhea, unspecified: Secondary | ICD-10-CM | POA: Diagnosis not present

## 2022-03-10 DIAGNOSIS — E876 Hypokalemia: Secondary | ICD-10-CM | POA: Diagnosis not present

## 2022-03-12 DIAGNOSIS — F03911 Unspecified dementia, unspecified severity, with agitation: Secondary | ICD-10-CM | POA: Diagnosis not present

## 2022-03-12 DIAGNOSIS — R2681 Unsteadiness on feet: Secondary | ICD-10-CM | POA: Diagnosis not present

## 2022-03-12 DIAGNOSIS — M6259 Muscle wasting and atrophy, not elsewhere classified, multiple sites: Secondary | ICD-10-CM | POA: Diagnosis not present

## 2022-03-12 DIAGNOSIS — Z7409 Other reduced mobility: Secondary | ICD-10-CM | POA: Diagnosis not present

## 2022-03-14 DIAGNOSIS — R2681 Unsteadiness on feet: Secondary | ICD-10-CM | POA: Diagnosis not present

## 2022-03-14 DIAGNOSIS — Z7409 Other reduced mobility: Secondary | ICD-10-CM | POA: Diagnosis not present

## 2022-03-14 DIAGNOSIS — F03911 Unspecified dementia, unspecified severity, with agitation: Secondary | ICD-10-CM | POA: Diagnosis not present

## 2022-03-14 DIAGNOSIS — R197 Diarrhea, unspecified: Secondary | ICD-10-CM | POA: Diagnosis not present

## 2022-03-14 DIAGNOSIS — R112 Nausea with vomiting, unspecified: Secondary | ICD-10-CM | POA: Diagnosis not present

## 2022-03-14 DIAGNOSIS — M6259 Muscle wasting and atrophy, not elsewhere classified, multiple sites: Secondary | ICD-10-CM | POA: Diagnosis not present

## 2022-03-15 DIAGNOSIS — F03911 Unspecified dementia, unspecified severity, with agitation: Secondary | ICD-10-CM | POA: Diagnosis not present

## 2022-03-15 DIAGNOSIS — R7303 Prediabetes: Secondary | ICD-10-CM | POA: Diagnosis not present

## 2022-03-15 DIAGNOSIS — I10 Essential (primary) hypertension: Secondary | ICD-10-CM | POA: Diagnosis not present

## 2022-03-15 DIAGNOSIS — Z7409 Other reduced mobility: Secondary | ICD-10-CM | POA: Diagnosis not present

## 2022-03-15 DIAGNOSIS — R2681 Unsteadiness on feet: Secondary | ICD-10-CM | POA: Diagnosis not present

## 2022-03-15 DIAGNOSIS — M6281 Muscle weakness (generalized): Secondary | ICD-10-CM | POA: Diagnosis not present

## 2022-03-15 DIAGNOSIS — M6259 Muscle wasting and atrophy, not elsewhere classified, multiple sites: Secondary | ICD-10-CM | POA: Diagnosis not present

## 2022-03-16 DIAGNOSIS — F03911 Unspecified dementia, unspecified severity, with agitation: Secondary | ICD-10-CM | POA: Diagnosis not present

## 2022-03-16 DIAGNOSIS — R2681 Unsteadiness on feet: Secondary | ICD-10-CM | POA: Diagnosis not present

## 2022-03-16 DIAGNOSIS — M6259 Muscle wasting and atrophy, not elsewhere classified, multiple sites: Secondary | ICD-10-CM | POA: Diagnosis not present

## 2022-03-16 DIAGNOSIS — Z7409 Other reduced mobility: Secondary | ICD-10-CM | POA: Diagnosis not present

## 2022-03-17 DIAGNOSIS — M6259 Muscle wasting and atrophy, not elsewhere classified, multiple sites: Secondary | ICD-10-CM | POA: Diagnosis not present

## 2022-03-17 DIAGNOSIS — Z7409 Other reduced mobility: Secondary | ICD-10-CM | POA: Diagnosis not present

## 2022-03-17 DIAGNOSIS — F03911 Unspecified dementia, unspecified severity, with agitation: Secondary | ICD-10-CM | POA: Diagnosis not present

## 2022-03-17 DIAGNOSIS — G4700 Insomnia, unspecified: Secondary | ICD-10-CM | POA: Diagnosis not present

## 2022-03-17 DIAGNOSIS — R2681 Unsteadiness on feet: Secondary | ICD-10-CM | POA: Diagnosis not present

## 2022-03-17 DIAGNOSIS — F0393 Unspecified dementia, unspecified severity, with mood disturbance: Secondary | ICD-10-CM | POA: Diagnosis not present

## 2022-03-18 DIAGNOSIS — F32A Depression, unspecified: Secondary | ICD-10-CM | POA: Diagnosis not present

## 2022-03-18 DIAGNOSIS — Z7409 Other reduced mobility: Secondary | ICD-10-CM | POA: Diagnosis not present

## 2022-03-18 DIAGNOSIS — Z8673 Personal history of transient ischemic attack (TIA), and cerebral infarction without residual deficits: Secondary | ICD-10-CM | POA: Diagnosis not present

## 2022-03-18 DIAGNOSIS — F03911 Unspecified dementia, unspecified severity, with agitation: Secondary | ICD-10-CM | POA: Diagnosis not present

## 2022-03-18 DIAGNOSIS — R2681 Unsteadiness on feet: Secondary | ICD-10-CM | POA: Diagnosis not present

## 2022-03-18 DIAGNOSIS — M6259 Muscle wasting and atrophy, not elsewhere classified, multiple sites: Secondary | ICD-10-CM | POA: Diagnosis not present

## 2022-03-28 DIAGNOSIS — R2681 Unsteadiness on feet: Secondary | ICD-10-CM | POA: Diagnosis not present

## 2022-03-28 DIAGNOSIS — R1311 Dysphagia, oral phase: Secondary | ICD-10-CM | POA: Diagnosis not present

## 2022-03-28 DIAGNOSIS — Z7409 Other reduced mobility: Secondary | ICD-10-CM | POA: Diagnosis not present

## 2022-03-28 DIAGNOSIS — F03911 Unspecified dementia, unspecified severity, with agitation: Secondary | ICD-10-CM | POA: Diagnosis not present

## 2022-03-28 DIAGNOSIS — M6259 Muscle wasting and atrophy, not elsewhere classified, multiple sites: Secondary | ICD-10-CM | POA: Diagnosis not present

## 2022-03-29 DIAGNOSIS — Z9049 Acquired absence of other specified parts of digestive tract: Secondary | ICD-10-CM | POA: Diagnosis not present

## 2022-03-29 DIAGNOSIS — Z7409 Other reduced mobility: Secondary | ICD-10-CM | POA: Diagnosis not present

## 2022-03-29 DIAGNOSIS — R2681 Unsteadiness on feet: Secondary | ICD-10-CM | POA: Diagnosis not present

## 2022-03-29 DIAGNOSIS — R109 Unspecified abdominal pain: Secondary | ICD-10-CM | POA: Diagnosis not present

## 2022-03-29 DIAGNOSIS — R1311 Dysphagia, oral phase: Secondary | ICD-10-CM | POA: Diagnosis not present

## 2022-03-29 DIAGNOSIS — R112 Nausea with vomiting, unspecified: Secondary | ICD-10-CM | POA: Diagnosis not present

## 2022-03-29 DIAGNOSIS — E559 Vitamin D deficiency, unspecified: Secondary | ICD-10-CM | POA: Diagnosis not present

## 2022-03-29 DIAGNOSIS — M6259 Muscle wasting and atrophy, not elsewhere classified, multiple sites: Secondary | ICD-10-CM | POA: Diagnosis not present

## 2022-03-29 DIAGNOSIS — F03911 Unspecified dementia, unspecified severity, with agitation: Secondary | ICD-10-CM | POA: Diagnosis not present

## 2022-03-29 DIAGNOSIS — D171 Benign lipomatous neoplasm of skin and subcutaneous tissue of trunk: Secondary | ICD-10-CM | POA: Diagnosis not present

## 2022-03-29 DIAGNOSIS — Z79899 Other long term (current) drug therapy: Secondary | ICD-10-CM | POA: Diagnosis not present

## 2022-03-30 DIAGNOSIS — F03911 Unspecified dementia, unspecified severity, with agitation: Secondary | ICD-10-CM | POA: Diagnosis not present

## 2022-03-30 DIAGNOSIS — R1311 Dysphagia, oral phase: Secondary | ICD-10-CM | POA: Diagnosis not present

## 2022-03-30 DIAGNOSIS — Z7409 Other reduced mobility: Secondary | ICD-10-CM | POA: Diagnosis not present

## 2022-03-30 DIAGNOSIS — M6259 Muscle wasting and atrophy, not elsewhere classified, multiple sites: Secondary | ICD-10-CM | POA: Diagnosis not present

## 2022-03-30 DIAGNOSIS — R2681 Unsteadiness on feet: Secondary | ICD-10-CM | POA: Diagnosis not present

## 2022-03-31 DIAGNOSIS — M6259 Muscle wasting and atrophy, not elsewhere classified, multiple sites: Secondary | ICD-10-CM | POA: Diagnosis not present

## 2022-03-31 DIAGNOSIS — Z7409 Other reduced mobility: Secondary | ICD-10-CM | POA: Diagnosis not present

## 2022-03-31 DIAGNOSIS — R1311 Dysphagia, oral phase: Secondary | ICD-10-CM | POA: Diagnosis not present

## 2022-03-31 DIAGNOSIS — F03911 Unspecified dementia, unspecified severity, with agitation: Secondary | ICD-10-CM | POA: Diagnosis not present

## 2022-03-31 DIAGNOSIS — R2681 Unsteadiness on feet: Secondary | ICD-10-CM | POA: Diagnosis not present

## 2022-04-01 DIAGNOSIS — I10 Essential (primary) hypertension: Secondary | ICD-10-CM | POA: Diagnosis not present

## 2022-04-01 DIAGNOSIS — R2681 Unsteadiness on feet: Secondary | ICD-10-CM | POA: Diagnosis not present

## 2022-04-01 DIAGNOSIS — I2510 Atherosclerotic heart disease of native coronary artery without angina pectoris: Secondary | ICD-10-CM | POA: Diagnosis not present

## 2022-04-01 DIAGNOSIS — F03911 Unspecified dementia, unspecified severity, with agitation: Secondary | ICD-10-CM | POA: Diagnosis not present

## 2022-04-01 DIAGNOSIS — R1311 Dysphagia, oral phase: Secondary | ICD-10-CM | POA: Diagnosis not present

## 2022-04-01 DIAGNOSIS — Z7409 Other reduced mobility: Secondary | ICD-10-CM | POA: Diagnosis not present

## 2022-04-01 DIAGNOSIS — I4891 Unspecified atrial fibrillation: Secondary | ICD-10-CM | POA: Diagnosis not present

## 2022-04-01 DIAGNOSIS — M6259 Muscle wasting and atrophy, not elsewhere classified, multiple sites: Secondary | ICD-10-CM | POA: Diagnosis not present

## 2022-04-02 DIAGNOSIS — Z7409 Other reduced mobility: Secondary | ICD-10-CM | POA: Diagnosis not present

## 2022-04-02 DIAGNOSIS — M6259 Muscle wasting and atrophy, not elsewhere classified, multiple sites: Secondary | ICD-10-CM | POA: Diagnosis not present

## 2022-04-02 DIAGNOSIS — R1311 Dysphagia, oral phase: Secondary | ICD-10-CM | POA: Diagnosis not present

## 2022-04-02 DIAGNOSIS — R2681 Unsteadiness on feet: Secondary | ICD-10-CM | POA: Diagnosis not present

## 2022-04-02 DIAGNOSIS — F03911 Unspecified dementia, unspecified severity, with agitation: Secondary | ICD-10-CM | POA: Diagnosis not present

## 2022-04-04 DIAGNOSIS — R1311 Dysphagia, oral phase: Secondary | ICD-10-CM | POA: Diagnosis not present

## 2022-04-04 DIAGNOSIS — M6259 Muscle wasting and atrophy, not elsewhere classified, multiple sites: Secondary | ICD-10-CM | POA: Diagnosis not present

## 2022-04-04 DIAGNOSIS — N390 Urinary tract infection, site not specified: Secondary | ICD-10-CM | POA: Diagnosis not present

## 2022-04-04 DIAGNOSIS — R2681 Unsteadiness on feet: Secondary | ICD-10-CM | POA: Diagnosis not present

## 2022-04-04 DIAGNOSIS — Z7409 Other reduced mobility: Secondary | ICD-10-CM | POA: Diagnosis not present

## 2022-04-04 DIAGNOSIS — F03911 Unspecified dementia, unspecified severity, with agitation: Secondary | ICD-10-CM | POA: Diagnosis not present

## 2022-04-05 DIAGNOSIS — F03911 Unspecified dementia, unspecified severity, with agitation: Secondary | ICD-10-CM | POA: Diagnosis not present

## 2022-04-05 DIAGNOSIS — R2681 Unsteadiness on feet: Secondary | ICD-10-CM | POA: Diagnosis not present

## 2022-04-05 DIAGNOSIS — M6259 Muscle wasting and atrophy, not elsewhere classified, multiple sites: Secondary | ICD-10-CM | POA: Diagnosis not present

## 2022-04-05 DIAGNOSIS — R1311 Dysphagia, oral phase: Secondary | ICD-10-CM | POA: Diagnosis not present

## 2022-04-05 DIAGNOSIS — Z7409 Other reduced mobility: Secondary | ICD-10-CM | POA: Diagnosis not present

## 2022-04-06 DIAGNOSIS — R2681 Unsteadiness on feet: Secondary | ICD-10-CM | POA: Diagnosis not present

## 2022-04-06 DIAGNOSIS — R1311 Dysphagia, oral phase: Secondary | ICD-10-CM | POA: Diagnosis not present

## 2022-04-06 DIAGNOSIS — M6259 Muscle wasting and atrophy, not elsewhere classified, multiple sites: Secondary | ICD-10-CM | POA: Diagnosis not present

## 2022-04-06 DIAGNOSIS — F03911 Unspecified dementia, unspecified severity, with agitation: Secondary | ICD-10-CM | POA: Diagnosis not present

## 2022-04-06 DIAGNOSIS — Z7409 Other reduced mobility: Secondary | ICD-10-CM | POA: Diagnosis not present

## 2022-04-07 DIAGNOSIS — Z7409 Other reduced mobility: Secondary | ICD-10-CM | POA: Diagnosis not present

## 2022-04-07 DIAGNOSIS — R2681 Unsteadiness on feet: Secondary | ICD-10-CM | POA: Diagnosis not present

## 2022-04-07 DIAGNOSIS — F03911 Unspecified dementia, unspecified severity, with agitation: Secondary | ICD-10-CM | POA: Diagnosis not present

## 2022-04-07 DIAGNOSIS — M6259 Muscle wasting and atrophy, not elsewhere classified, multiple sites: Secondary | ICD-10-CM | POA: Diagnosis not present

## 2022-04-07 DIAGNOSIS — R1311 Dysphagia, oral phase: Secondary | ICD-10-CM | POA: Diagnosis not present

## 2022-04-08 DIAGNOSIS — R1311 Dysphagia, oral phase: Secondary | ICD-10-CM | POA: Diagnosis not present

## 2022-04-08 DIAGNOSIS — F03911 Unspecified dementia, unspecified severity, with agitation: Secondary | ICD-10-CM | POA: Diagnosis not present

## 2022-04-08 DIAGNOSIS — M6259 Muscle wasting and atrophy, not elsewhere classified, multiple sites: Secondary | ICD-10-CM | POA: Diagnosis not present

## 2022-04-08 DIAGNOSIS — Z7409 Other reduced mobility: Secondary | ICD-10-CM | POA: Diagnosis not present

## 2022-04-08 DIAGNOSIS — R2681 Unsteadiness on feet: Secondary | ICD-10-CM | POA: Diagnosis not present

## 2022-04-09 DIAGNOSIS — W19XXXD Unspecified fall, subsequent encounter: Secondary | ICD-10-CM | POA: Diagnosis not present

## 2022-04-09 DIAGNOSIS — Z7409 Other reduced mobility: Secondary | ICD-10-CM | POA: Diagnosis not present

## 2022-04-09 DIAGNOSIS — R634 Abnormal weight loss: Secondary | ICD-10-CM | POA: Diagnosis not present

## 2022-04-09 DIAGNOSIS — R2681 Unsteadiness on feet: Secondary | ICD-10-CM | POA: Diagnosis not present

## 2022-04-09 DIAGNOSIS — R1311 Dysphagia, oral phase: Secondary | ICD-10-CM | POA: Diagnosis not present

## 2022-04-09 DIAGNOSIS — M6259 Muscle wasting and atrophy, not elsewhere classified, multiple sites: Secondary | ICD-10-CM | POA: Diagnosis not present

## 2022-04-09 DIAGNOSIS — F03911 Unspecified dementia, unspecified severity, with agitation: Secondary | ICD-10-CM | POA: Diagnosis not present

## 2022-04-10 DIAGNOSIS — R2681 Unsteadiness on feet: Secondary | ICD-10-CM | POA: Diagnosis not present

## 2022-04-10 DIAGNOSIS — F03911 Unspecified dementia, unspecified severity, with agitation: Secondary | ICD-10-CM | POA: Diagnosis not present

## 2022-04-10 DIAGNOSIS — R1311 Dysphagia, oral phase: Secondary | ICD-10-CM | POA: Diagnosis not present

## 2022-04-10 DIAGNOSIS — Z7409 Other reduced mobility: Secondary | ICD-10-CM | POA: Diagnosis not present

## 2022-04-10 DIAGNOSIS — M6259 Muscle wasting and atrophy, not elsewhere classified, multiple sites: Secondary | ICD-10-CM | POA: Diagnosis not present

## 2022-04-11 DIAGNOSIS — F03911 Unspecified dementia, unspecified severity, with agitation: Secondary | ICD-10-CM | POA: Diagnosis not present

## 2022-04-11 DIAGNOSIS — M6259 Muscle wasting and atrophy, not elsewhere classified, multiple sites: Secondary | ICD-10-CM | POA: Diagnosis not present

## 2022-04-11 DIAGNOSIS — R2681 Unsteadiness on feet: Secondary | ICD-10-CM | POA: Diagnosis not present

## 2022-04-11 DIAGNOSIS — R1311 Dysphagia, oral phase: Secondary | ICD-10-CM | POA: Diagnosis not present

## 2022-04-11 DIAGNOSIS — Z7409 Other reduced mobility: Secondary | ICD-10-CM | POA: Diagnosis not present

## 2022-04-12 DIAGNOSIS — R2681 Unsteadiness on feet: Secondary | ICD-10-CM | POA: Diagnosis not present

## 2022-04-12 DIAGNOSIS — F03911 Unspecified dementia, unspecified severity, with agitation: Secondary | ICD-10-CM | POA: Diagnosis not present

## 2022-04-12 DIAGNOSIS — Z7409 Other reduced mobility: Secondary | ICD-10-CM | POA: Diagnosis not present

## 2022-04-12 DIAGNOSIS — M6259 Muscle wasting and atrophy, not elsewhere classified, multiple sites: Secondary | ICD-10-CM | POA: Diagnosis not present

## 2022-04-12 DIAGNOSIS — R1311 Dysphagia, oral phase: Secondary | ICD-10-CM | POA: Diagnosis not present

## 2022-04-13 DIAGNOSIS — M6259 Muscle wasting and atrophy, not elsewhere classified, multiple sites: Secondary | ICD-10-CM | POA: Diagnosis not present

## 2022-04-13 DIAGNOSIS — Z7409 Other reduced mobility: Secondary | ICD-10-CM | POA: Diagnosis not present

## 2022-04-13 DIAGNOSIS — F03911 Unspecified dementia, unspecified severity, with agitation: Secondary | ICD-10-CM | POA: Diagnosis not present

## 2022-04-13 DIAGNOSIS — R2681 Unsteadiness on feet: Secondary | ICD-10-CM | POA: Diagnosis not present

## 2022-04-13 DIAGNOSIS — R1311 Dysphagia, oral phase: Secondary | ICD-10-CM | POA: Diagnosis not present

## 2022-04-14 DIAGNOSIS — K5900 Constipation, unspecified: Secondary | ICD-10-CM | POA: Diagnosis not present

## 2022-04-14 DIAGNOSIS — R578 Other shock: Secondary | ICD-10-CM | POA: Diagnosis not present

## 2022-04-14 DIAGNOSIS — M6259 Muscle wasting and atrophy, not elsewhere classified, multiple sites: Secondary | ICD-10-CM | POA: Diagnosis not present

## 2022-04-14 DIAGNOSIS — R0602 Shortness of breath: Secondary | ICD-10-CM | POA: Diagnosis not present

## 2022-04-14 DIAGNOSIS — F0394 Unspecified dementia, unspecified severity, with anxiety: Secondary | ICD-10-CM | POA: Diagnosis not present

## 2022-04-14 DIAGNOSIS — D696 Thrombocytopenia, unspecified: Secondary | ICD-10-CM | POA: Diagnosis not present

## 2022-04-14 DIAGNOSIS — R109 Unspecified abdominal pain: Secondary | ICD-10-CM | POA: Diagnosis not present

## 2022-04-14 DIAGNOSIS — G301 Alzheimer's disease with late onset: Secondary | ICD-10-CM | POA: Diagnosis not present

## 2022-04-14 DIAGNOSIS — K219 Gastro-esophageal reflux disease without esophagitis: Secondary | ICD-10-CM | POA: Diagnosis not present

## 2022-04-14 DIAGNOSIS — K8689 Other specified diseases of pancreas: Secondary | ICD-10-CM | POA: Diagnosis not present

## 2022-04-14 DIAGNOSIS — Z7409 Other reduced mobility: Secondary | ICD-10-CM | POA: Diagnosis not present

## 2022-04-14 DIAGNOSIS — I69898 Other sequelae of other cerebrovascular disease: Secondary | ICD-10-CM | POA: Diagnosis not present

## 2022-04-14 DIAGNOSIS — G4700 Insomnia, unspecified: Secondary | ICD-10-CM | POA: Diagnosis not present

## 2022-04-14 DIAGNOSIS — Z743 Need for continuous supervision: Secondary | ICD-10-CM | POA: Diagnosis not present

## 2022-04-14 DIAGNOSIS — R001 Bradycardia, unspecified: Secondary | ICD-10-CM | POA: Diagnosis not present

## 2022-04-14 DIAGNOSIS — D62 Acute posthemorrhagic anemia: Secondary | ICD-10-CM | POA: Diagnosis not present

## 2022-04-14 DIAGNOSIS — F02B3 Dementia in other diseases classified elsewhere, moderate, with mood disturbance: Secondary | ICD-10-CM | POA: Diagnosis not present

## 2022-04-14 DIAGNOSIS — Z515 Encounter for palliative care: Secondary | ICD-10-CM | POA: Diagnosis not present

## 2022-04-14 DIAGNOSIS — I482 Chronic atrial fibrillation, unspecified: Secondary | ICD-10-CM | POA: Diagnosis not present

## 2022-04-14 DIAGNOSIS — G9341 Metabolic encephalopathy: Secondary | ICD-10-CM | POA: Diagnosis not present

## 2022-04-14 DIAGNOSIS — F02C18 Dementia in other diseases classified elsewhere, severe, with other behavioral disturbance: Secondary | ICD-10-CM | POA: Diagnosis not present

## 2022-04-14 DIAGNOSIS — M25561 Pain in right knee: Secondary | ICD-10-CM | POA: Diagnosis not present

## 2022-04-14 DIAGNOSIS — N281 Cyst of kidney, acquired: Secondary | ICD-10-CM | POA: Diagnosis not present

## 2022-04-14 DIAGNOSIS — M25511 Pain in right shoulder: Secondary | ICD-10-CM | POA: Diagnosis not present

## 2022-04-14 DIAGNOSIS — I10 Essential (primary) hypertension: Secondary | ICD-10-CM | POA: Diagnosis not present

## 2022-04-14 DIAGNOSIS — K6389 Other specified diseases of intestine: Secondary | ICD-10-CM | POA: Diagnosis not present

## 2022-04-14 DIAGNOSIS — F0393 Unspecified dementia, unspecified severity, with mood disturbance: Secondary | ICD-10-CM | POA: Diagnosis not present

## 2022-04-14 DIAGNOSIS — Z9049 Acquired absence of other specified parts of digestive tract: Secondary | ICD-10-CM | POA: Diagnosis not present

## 2022-04-14 DIAGNOSIS — G8929 Other chronic pain: Secondary | ICD-10-CM | POA: Diagnosis not present

## 2022-04-14 DIAGNOSIS — R339 Retention of urine, unspecified: Secondary | ICD-10-CM | POA: Diagnosis not present

## 2022-04-14 DIAGNOSIS — I480 Paroxysmal atrial fibrillation: Secondary | ICD-10-CM | POA: Diagnosis not present

## 2022-04-14 DIAGNOSIS — E860 Dehydration: Secondary | ICD-10-CM | POA: Diagnosis not present

## 2022-04-14 DIAGNOSIS — R58 Hemorrhage, not elsewhere classified: Secondary | ICD-10-CM | POA: Diagnosis not present

## 2022-04-14 DIAGNOSIS — S0003XA Contusion of scalp, initial encounter: Secondary | ICD-10-CM | POA: Diagnosis not present

## 2022-04-14 DIAGNOSIS — R296 Repeated falls: Secondary | ICD-10-CM | POA: Diagnosis not present

## 2022-04-14 DIAGNOSIS — K5731 Diverticulosis of large intestine without perforation or abscess with bleeding: Secondary | ICD-10-CM | POA: Diagnosis not present

## 2022-04-14 DIAGNOSIS — F03C11 Unspecified dementia, severe, with agitation: Secondary | ICD-10-CM | POA: Diagnosis not present

## 2022-04-14 DIAGNOSIS — Z7901 Long term (current) use of anticoagulants: Secondary | ICD-10-CM | POA: Diagnosis not present

## 2022-04-14 DIAGNOSIS — K760 Fatty (change of) liver, not elsewhere classified: Secondary | ICD-10-CM | POA: Diagnosis not present

## 2022-04-14 DIAGNOSIS — F03911 Unspecified dementia, unspecified severity, with agitation: Secondary | ICD-10-CM | POA: Diagnosis not present

## 2022-04-14 DIAGNOSIS — I959 Hypotension, unspecified: Secondary | ICD-10-CM | POA: Diagnosis not present

## 2022-04-14 DIAGNOSIS — E876 Hypokalemia: Secondary | ICD-10-CM | POA: Diagnosis not present

## 2022-04-14 DIAGNOSIS — K5289 Other specified noninfective gastroenteritis and colitis: Secondary | ICD-10-CM | POA: Diagnosis not present

## 2022-04-14 DIAGNOSIS — F01B18 Vascular dementia, moderate, with other behavioral disturbance: Secondary | ICD-10-CM | POA: Diagnosis not present

## 2022-04-14 DIAGNOSIS — R1311 Dysphagia, oral phase: Secondary | ICD-10-CM | POA: Diagnosis not present

## 2022-04-14 DIAGNOSIS — I2510 Atherosclerotic heart disease of native coronary artery without angina pectoris: Secondary | ICD-10-CM | POA: Diagnosis not present

## 2022-04-14 DIAGNOSIS — K922 Gastrointestinal hemorrhage, unspecified: Secondary | ICD-10-CM | POA: Diagnosis not present

## 2022-04-14 DIAGNOSIS — G2581 Restless legs syndrome: Secondary | ICD-10-CM | POA: Diagnosis not present

## 2022-04-14 DIAGNOSIS — R6889 Other general symptoms and signs: Secondary | ICD-10-CM | POA: Diagnosis not present

## 2022-04-14 DIAGNOSIS — R2681 Unsteadiness on feet: Secondary | ICD-10-CM | POA: Diagnosis not present

## 2022-04-14 DIAGNOSIS — K5641 Fecal impaction: Secondary | ICD-10-CM | POA: Diagnosis not present

## 2022-04-14 DIAGNOSIS — E119 Type 2 diabetes mellitus without complications: Secondary | ICD-10-CM | POA: Diagnosis not present

## 2022-04-14 DIAGNOSIS — R112 Nausea with vomiting, unspecified: Secondary | ICD-10-CM | POA: Diagnosis not present

## 2022-04-15 DIAGNOSIS — R578 Other shock: Secondary | ICD-10-CM | POA: Diagnosis not present

## 2022-04-16 DIAGNOSIS — R578 Other shock: Secondary | ICD-10-CM | POA: Diagnosis not present

## 2022-04-17 DIAGNOSIS — G301 Alzheimer's disease with late onset: Secondary | ICD-10-CM | POA: Diagnosis not present

## 2022-04-17 DIAGNOSIS — K922 Gastrointestinal hemorrhage, unspecified: Secondary | ICD-10-CM | POA: Diagnosis not present

## 2022-04-17 DIAGNOSIS — I482 Chronic atrial fibrillation, unspecified: Secondary | ICD-10-CM | POA: Diagnosis not present

## 2022-04-17 DIAGNOSIS — D62 Acute posthemorrhagic anemia: Secondary | ICD-10-CM | POA: Diagnosis not present

## 2022-04-17 DIAGNOSIS — K5289 Other specified noninfective gastroenteritis and colitis: Secondary | ICD-10-CM | POA: Diagnosis not present

## 2022-04-17 DIAGNOSIS — R578 Other shock: Secondary | ICD-10-CM | POA: Diagnosis not present

## 2022-04-17 DIAGNOSIS — F02C18 Dementia in other diseases classified elsewhere, severe, with other behavioral disturbance: Secondary | ICD-10-CM | POA: Diagnosis not present

## 2022-04-17 DIAGNOSIS — E876 Hypokalemia: Secondary | ICD-10-CM | POA: Diagnosis not present

## 2022-04-18 DIAGNOSIS — K922 Gastrointestinal hemorrhage, unspecified: Secondary | ICD-10-CM | POA: Diagnosis not present

## 2022-04-18 DIAGNOSIS — I482 Chronic atrial fibrillation, unspecified: Secondary | ICD-10-CM | POA: Diagnosis not present

## 2022-04-18 DIAGNOSIS — R578 Other shock: Secondary | ICD-10-CM | POA: Diagnosis not present

## 2022-04-18 DIAGNOSIS — G301 Alzheimer's disease with late onset: Secondary | ICD-10-CM | POA: Diagnosis not present

## 2022-04-18 DIAGNOSIS — D62 Acute posthemorrhagic anemia: Secondary | ICD-10-CM | POA: Diagnosis not present

## 2022-04-19 DIAGNOSIS — G9341 Metabolic encephalopathy: Secondary | ICD-10-CM | POA: Diagnosis not present

## 2022-04-19 DIAGNOSIS — K922 Gastrointestinal hemorrhage, unspecified: Secondary | ICD-10-CM | POA: Diagnosis not present

## 2022-04-19 DIAGNOSIS — R578 Other shock: Secondary | ICD-10-CM | POA: Diagnosis not present

## 2022-04-20 DIAGNOSIS — G9341 Metabolic encephalopathy: Secondary | ICD-10-CM | POA: Diagnosis not present

## 2022-04-20 DIAGNOSIS — R578 Other shock: Secondary | ICD-10-CM | POA: Diagnosis not present

## 2022-04-20 DIAGNOSIS — K922 Gastrointestinal hemorrhage, unspecified: Secondary | ICD-10-CM | POA: Diagnosis not present

## 2022-04-21 DIAGNOSIS — G9341 Metabolic encephalopathy: Secondary | ICD-10-CM | POA: Diagnosis not present

## 2022-04-21 DIAGNOSIS — Z515 Encounter for palliative care: Secondary | ICD-10-CM | POA: Diagnosis not present

## 2022-04-21 DIAGNOSIS — K922 Gastrointestinal hemorrhage, unspecified: Secondary | ICD-10-CM | POA: Diagnosis not present

## 2022-04-21 DIAGNOSIS — G301 Alzheimer's disease with late onset: Secondary | ICD-10-CM | POA: Diagnosis not present

## 2022-04-21 DIAGNOSIS — R2681 Unsteadiness on feet: Secondary | ICD-10-CM | POA: Diagnosis not present

## 2022-04-21 DIAGNOSIS — R578 Other shock: Secondary | ICD-10-CM | POA: Diagnosis not present

## 2022-04-22 DIAGNOSIS — K922 Gastrointestinal hemorrhage, unspecified: Secondary | ICD-10-CM | POA: Diagnosis not present

## 2022-04-22 DIAGNOSIS — G9341 Metabolic encephalopathy: Secondary | ICD-10-CM | POA: Diagnosis not present

## 2022-04-22 DIAGNOSIS — R578 Other shock: Secondary | ICD-10-CM | POA: Diagnosis not present

## 2022-04-23 DIAGNOSIS — G9341 Metabolic encephalopathy: Secondary | ICD-10-CM | POA: Diagnosis not present

## 2022-04-23 DIAGNOSIS — R578 Other shock: Secondary | ICD-10-CM | POA: Diagnosis not present

## 2022-04-23 DIAGNOSIS — K922 Gastrointestinal hemorrhage, unspecified: Secondary | ICD-10-CM | POA: Diagnosis not present

## 2022-04-24 DIAGNOSIS — R578 Other shock: Secondary | ICD-10-CM | POA: Diagnosis not present

## 2022-04-24 DIAGNOSIS — G9341 Metabolic encephalopathy: Secondary | ICD-10-CM | POA: Diagnosis not present

## 2022-04-24 DIAGNOSIS — K922 Gastrointestinal hemorrhage, unspecified: Secondary | ICD-10-CM | POA: Diagnosis not present

## 2022-04-25 DIAGNOSIS — M6259 Muscle wasting and atrophy, not elsewhere classified, multiple sites: Secondary | ICD-10-CM | POA: Diagnosis not present

## 2022-04-25 DIAGNOSIS — F03911 Unspecified dementia, unspecified severity, with agitation: Secondary | ICD-10-CM | POA: Diagnosis not present

## 2022-04-25 DIAGNOSIS — R2681 Unsteadiness on feet: Secondary | ICD-10-CM | POA: Diagnosis not present

## 2022-04-25 DIAGNOSIS — R1311 Dysphagia, oral phase: Secondary | ICD-10-CM | POA: Diagnosis not present

## 2022-04-25 DIAGNOSIS — Z1622 Resistance to vancomycin related antibiotics: Secondary | ICD-10-CM | POA: Diagnosis not present

## 2022-04-25 DIAGNOSIS — Z7409 Other reduced mobility: Secondary | ICD-10-CM | POA: Diagnosis not present

## 2022-04-26 DIAGNOSIS — Z515 Encounter for palliative care: Secondary | ICD-10-CM | POA: Diagnosis not present

## 2022-04-26 DIAGNOSIS — K922 Gastrointestinal hemorrhage, unspecified: Secondary | ICD-10-CM | POA: Diagnosis not present

## 2022-04-26 DIAGNOSIS — I4891 Unspecified atrial fibrillation: Secondary | ICD-10-CM | POA: Diagnosis not present

## 2022-04-27 DIAGNOSIS — G301 Alzheimer's disease with late onset: Secondary | ICD-10-CM | POA: Diagnosis not present

## 2022-04-27 DIAGNOSIS — E785 Hyperlipidemia, unspecified: Secondary | ICD-10-CM | POA: Diagnosis not present

## 2022-04-27 DIAGNOSIS — I10 Essential (primary) hypertension: Secondary | ICD-10-CM | POA: Diagnosis not present

## 2022-04-28 DIAGNOSIS — F02B3 Dementia in other diseases classified elsewhere, moderate, with mood disturbance: Secondary | ICD-10-CM | POA: Diagnosis not present

## 2022-04-28 DIAGNOSIS — I69898 Other sequelae of other cerebrovascular disease: Secondary | ICD-10-CM | POA: Diagnosis not present

## 2022-04-28 DIAGNOSIS — F01B18 Vascular dementia, moderate, with other behavioral disturbance: Secondary | ICD-10-CM | POA: Diagnosis not present

## 2022-04-28 DIAGNOSIS — G301 Alzheimer's disease with late onset: Secondary | ICD-10-CM | POA: Diagnosis not present

## 2022-05-03 DIAGNOSIS — K922 Gastrointestinal hemorrhage, unspecified: Secondary | ICD-10-CM | POA: Diagnosis not present

## 2022-05-11 DIAGNOSIS — B379 Candidiasis, unspecified: Secondary | ICD-10-CM | POA: Diagnosis not present

## 2022-05-11 DIAGNOSIS — I10 Essential (primary) hypertension: Secondary | ICD-10-CM | POA: Diagnosis not present

## 2022-05-12 DIAGNOSIS — F01B18 Vascular dementia, moderate, with other behavioral disturbance: Secondary | ICD-10-CM | POA: Diagnosis not present

## 2022-05-12 DIAGNOSIS — I69898 Other sequelae of other cerebrovascular disease: Secondary | ICD-10-CM | POA: Diagnosis not present

## 2022-05-12 DIAGNOSIS — F02B3 Dementia in other diseases classified elsewhere, moderate, with mood disturbance: Secondary | ICD-10-CM | POA: Diagnosis not present

## 2022-05-12 DIAGNOSIS — G4700 Insomnia, unspecified: Secondary | ICD-10-CM | POA: Diagnosis not present

## 2022-05-12 DIAGNOSIS — G301 Alzheimer's disease with late onset: Secondary | ICD-10-CM | POA: Diagnosis not present

## 2022-05-25 DIAGNOSIS — K922 Gastrointestinal hemorrhage, unspecified: Secondary | ICD-10-CM | POA: Diagnosis not present

## 2022-05-25 DIAGNOSIS — I4891 Unspecified atrial fibrillation: Secondary | ICD-10-CM | POA: Diagnosis not present

## 2022-05-25 DIAGNOSIS — Z515 Encounter for palliative care: Secondary | ICD-10-CM | POA: Diagnosis not present

## 2022-06-15 DIAGNOSIS — F03911 Unspecified dementia, unspecified severity, with agitation: Secondary | ICD-10-CM | POA: Diagnosis not present

## 2022-06-15 DIAGNOSIS — I2510 Atherosclerotic heart disease of native coronary artery without angina pectoris: Secondary | ICD-10-CM | POA: Diagnosis not present

## 2022-06-15 DIAGNOSIS — I679 Cerebrovascular disease, unspecified: Secondary | ICD-10-CM | POA: Diagnosis not present

## 2022-06-16 DIAGNOSIS — F03911 Unspecified dementia, unspecified severity, with agitation: Secondary | ICD-10-CM | POA: Diagnosis not present

## 2022-06-16 DIAGNOSIS — I4891 Unspecified atrial fibrillation: Secondary | ICD-10-CM | POA: Diagnosis not present

## 2022-06-16 DIAGNOSIS — Z515 Encounter for palliative care: Secondary | ICD-10-CM | POA: Diagnosis not present

## 2022-06-16 DIAGNOSIS — I2510 Atherosclerotic heart disease of native coronary artery without angina pectoris: Secondary | ICD-10-CM | POA: Diagnosis not present

## 2022-06-25 DIAGNOSIS — Z743 Need for continuous supervision: Secondary | ICD-10-CM | POA: Diagnosis not present

## 2022-06-25 DIAGNOSIS — R58 Hemorrhage, not elsewhere classified: Secondary | ICD-10-CM | POA: Diagnosis not present

## 2022-06-25 DIAGNOSIS — Z7401 Bed confinement status: Secondary | ICD-10-CM | POA: Diagnosis not present

## 2022-06-25 DIAGNOSIS — W19XXXA Unspecified fall, initial encounter: Secondary | ICD-10-CM | POA: Diagnosis not present

## 2022-06-25 DIAGNOSIS — K5730 Diverticulosis of large intestine without perforation or abscess without bleeding: Secondary | ICD-10-CM | POA: Diagnosis not present

## 2022-06-25 DIAGNOSIS — S0101XA Laceration without foreign body of scalp, initial encounter: Secondary | ICD-10-CM | POA: Diagnosis not present

## 2022-06-25 DIAGNOSIS — I10 Essential (primary) hypertension: Secondary | ICD-10-CM | POA: Diagnosis not present

## 2022-06-25 DIAGNOSIS — S0990XA Unspecified injury of head, initial encounter: Secondary | ICD-10-CM | POA: Diagnosis not present

## 2022-06-25 DIAGNOSIS — S3993XA Unspecified injury of pelvis, initial encounter: Secondary | ICD-10-CM | POA: Diagnosis not present

## 2022-06-25 DIAGNOSIS — S199XXA Unspecified injury of neck, initial encounter: Secondary | ICD-10-CM | POA: Diagnosis not present

## 2022-06-25 DIAGNOSIS — J9811 Atelectasis: Secondary | ICD-10-CM | POA: Diagnosis not present

## 2022-06-25 DIAGNOSIS — E119 Type 2 diabetes mellitus without complications: Secondary | ICD-10-CM | POA: Diagnosis not present

## 2022-06-25 DIAGNOSIS — Z7902 Long term (current) use of antithrombotics/antiplatelets: Secondary | ICD-10-CM | POA: Diagnosis not present

## 2022-06-25 DIAGNOSIS — S3991XA Unspecified injury of abdomen, initial encounter: Secondary | ICD-10-CM | POA: Diagnosis not present

## 2022-06-25 DIAGNOSIS — Z79899 Other long term (current) drug therapy: Secondary | ICD-10-CM | POA: Diagnosis not present

## 2022-07-18 DIAGNOSIS — F03911 Unspecified dementia, unspecified severity, with agitation: Secondary | ICD-10-CM | POA: Diagnosis not present

## 2022-07-18 DIAGNOSIS — E46 Unspecified protein-calorie malnutrition: Secondary | ICD-10-CM | POA: Diagnosis not present

## 2022-07-18 DIAGNOSIS — K219 Gastro-esophageal reflux disease without esophagitis: Secondary | ICD-10-CM | POA: Diagnosis not present

## 2022-07-18 DIAGNOSIS — G2581 Restless legs syndrome: Secondary | ICD-10-CM | POA: Diagnosis not present

## 2022-08-19 DIAGNOSIS — E46 Unspecified protein-calorie malnutrition: Secondary | ICD-10-CM | POA: Diagnosis not present

## 2022-08-19 DIAGNOSIS — Z515 Encounter for palliative care: Secondary | ICD-10-CM | POA: Diagnosis not present

## 2022-08-19 DIAGNOSIS — F03911 Unspecified dementia, unspecified severity, with agitation: Secondary | ICD-10-CM | POA: Diagnosis not present

## 2022-08-19 DIAGNOSIS — I4891 Unspecified atrial fibrillation: Secondary | ICD-10-CM | POA: Diagnosis not present

## 2022-10-20 DIAGNOSIS — M6259 Muscle wasting and atrophy, not elsewhere classified, multiple sites: Secondary | ICD-10-CM | POA: Diagnosis not present

## 2022-10-21 DIAGNOSIS — M6259 Muscle wasting and atrophy, not elsewhere classified, multiple sites: Secondary | ICD-10-CM | POA: Diagnosis not present

## 2022-10-24 DIAGNOSIS — Z743 Need for continuous supervision: Secondary | ICD-10-CM | POA: Diagnosis not present

## 2022-10-24 DIAGNOSIS — W19XXXA Unspecified fall, initial encounter: Secondary | ICD-10-CM | POA: Diagnosis not present

## 2022-10-24 DIAGNOSIS — R6889 Other general symptoms and signs: Secondary | ICD-10-CM | POA: Diagnosis not present

## 2022-10-24 DIAGNOSIS — R1111 Vomiting without nausea: Secondary | ICD-10-CM | POA: Diagnosis not present

## 2022-10-24 DIAGNOSIS — M6259 Muscle wasting and atrophy, not elsewhere classified, multiple sites: Secondary | ICD-10-CM | POA: Diagnosis not present

## 2022-10-27 DEATH — deceased
